# Patient Record
Sex: Female | Born: 1935 | Race: White | Hispanic: No | Marital: Married | Smoking: Former smoker
Health system: Southern US, Community
[De-identification: ages and names within clinical notes are randomized; demographics above are authoritative.]

## PROBLEM LIST (undated history)

## (undated) DIAGNOSIS — I4891 Unspecified atrial fibrillation: Secondary | ICD-10-CM

## (undated) DIAGNOSIS — M5090 Cervical disc disorder, unspecified, unspecified cervical region: Secondary | ICD-10-CM

## (undated) DIAGNOSIS — I6529 Occlusion and stenosis of unspecified carotid artery: Secondary | ICD-10-CM

## (undated) DIAGNOSIS — F419 Anxiety disorder, unspecified: Secondary | ICD-10-CM

## (undated) DIAGNOSIS — I4510 Unspecified right bundle-branch block: Secondary | ICD-10-CM

## (undated) DIAGNOSIS — I10 Essential (primary) hypertension: Secondary | ICD-10-CM

## (undated) DIAGNOSIS — M1990 Unspecified osteoarthritis, unspecified site: Secondary | ICD-10-CM

## (undated) DIAGNOSIS — R040 Epistaxis: Secondary | ICD-10-CM

## (undated) DIAGNOSIS — I701 Atherosclerosis of renal artery: Secondary | ICD-10-CM

## (undated) DIAGNOSIS — R0600 Dyspnea, unspecified: Secondary | ICD-10-CM

## (undated) DIAGNOSIS — Z87442 Personal history of urinary calculi: Secondary | ICD-10-CM

## (undated) DIAGNOSIS — E785 Hyperlipidemia, unspecified: Secondary | ICD-10-CM

## (undated) HISTORY — PX: CATARACT EXTRACTION: SUR2

## (undated) HISTORY — DX: Hyperlipidemia, unspecified: E78.5

## (undated) HISTORY — PX: OTHER SURGICAL HISTORY: SHX169

## (undated) HISTORY — PX: TONSILLECTOMY: SUR1361

## (undated) HISTORY — DX: Cervical disc disorder, unspecified, unspecified cervical region: M50.90

## (undated) HISTORY — PX: BACK SURGERY: SHX140

## (undated) HISTORY — DX: Atherosclerosis of renal artery: I70.1

## (undated) HISTORY — PX: DOPPLER ECHOCARDIOGRAPHY: SHX263

## (undated) HISTORY — DX: Unspecified atrial fibrillation: I48.91

## (undated) HISTORY — DX: Unspecified right bundle-branch block: I45.10

---
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
## 2000-10-29 ENCOUNTER — Other Ambulatory Visit: Admission: RE | Admit: 2000-10-29 | Discharge: 2000-10-29 | Payer: Self-pay | Admitting: Obstetrics and Gynecology

## 2002-08-16 ENCOUNTER — Emergency Department (HOSPITAL_COMMUNITY): Admission: EM | Admit: 2002-08-16 | Discharge: 2002-08-16 | Payer: Self-pay | Admitting: Emergency Medicine

## 2002-08-16 ENCOUNTER — Emergency Department (HOSPITAL_COMMUNITY): Admission: EM | Admit: 2002-08-16 | Discharge: 2002-08-16 | Payer: Self-pay | Admitting: *Deleted

## 2002-11-02 ENCOUNTER — Other Ambulatory Visit: Admission: RE | Admit: 2002-11-02 | Discharge: 2002-11-02 | Payer: Self-pay | Admitting: Obstetrics and Gynecology

## 2003-01-04 ENCOUNTER — Ambulatory Visit (HOSPITAL_COMMUNITY): Admission: RE | Admit: 2003-01-04 | Discharge: 2003-01-04 | Payer: Self-pay | Admitting: Obstetrics and Gynecology

## 2003-01-04 ENCOUNTER — Encounter: Payer: Self-pay | Admitting: Obstetrics and Gynecology

## 2004-01-18 ENCOUNTER — Ambulatory Visit (HOSPITAL_COMMUNITY): Admission: RE | Admit: 2004-01-18 | Discharge: 2004-01-18 | Payer: Self-pay | Admitting: Obstetrics and Gynecology

## 2004-01-31 ENCOUNTER — Ambulatory Visit (HOSPITAL_COMMUNITY): Admission: RE | Admit: 2004-01-31 | Discharge: 2004-01-31 | Payer: Self-pay | Admitting: Obstetrics and Gynecology

## 2004-02-20 ENCOUNTER — Ambulatory Visit (HOSPITAL_COMMUNITY): Admission: RE | Admit: 2004-02-20 | Discharge: 2004-02-20 | Payer: Self-pay | Admitting: Internal Medicine

## 2004-05-28 ENCOUNTER — Ambulatory Visit (HOSPITAL_COMMUNITY): Admission: RE | Admit: 2004-05-28 | Discharge: 2004-05-28 | Payer: Self-pay | Admitting: Internal Medicine

## 2004-05-29 ENCOUNTER — Ambulatory Visit (HOSPITAL_COMMUNITY): Admission: RE | Admit: 2004-05-29 | Discharge: 2004-05-29 | Payer: Self-pay | Admitting: Internal Medicine

## 2004-06-01 ENCOUNTER — Emergency Department (HOSPITAL_COMMUNITY): Admission: EM | Admit: 2004-06-01 | Discharge: 2004-06-01 | Payer: Self-pay | Admitting: Emergency Medicine

## 2004-07-03 ENCOUNTER — Ambulatory Visit (HOSPITAL_COMMUNITY): Admission: RE | Admit: 2004-07-03 | Discharge: 2004-07-04 | Payer: Self-pay | Admitting: Cardiovascular Disease

## 2004-07-06 HISTORY — PX: CAROTID STENT: SHX1301

## 2004-07-14 ENCOUNTER — Emergency Department (HOSPITAL_COMMUNITY): Admission: EM | Admit: 2004-07-14 | Discharge: 2004-07-14 | Payer: Self-pay | Admitting: Emergency Medicine

## 2004-08-02 ENCOUNTER — Emergency Department (HOSPITAL_COMMUNITY): Admission: EM | Admit: 2004-08-02 | Discharge: 2004-08-02 | Payer: Self-pay | Admitting: *Deleted

## 2005-01-09 ENCOUNTER — Ambulatory Visit: Payer: Self-pay | Admitting: Internal Medicine

## 2005-01-26 ENCOUNTER — Ambulatory Visit: Payer: Self-pay | Admitting: Internal Medicine

## 2005-01-26 ENCOUNTER — Encounter (INDEPENDENT_AMBULATORY_CARE_PROVIDER_SITE_OTHER): Payer: Self-pay | Admitting: Internal Medicine

## 2005-01-26 ENCOUNTER — Ambulatory Visit (HOSPITAL_COMMUNITY): Admission: RE | Admit: 2005-01-26 | Discharge: 2005-01-26 | Payer: Self-pay | Admitting: Internal Medicine

## 2006-08-04 HISTORY — PX: OTHER SURGICAL HISTORY: SHX169

## 2010-06-19 ENCOUNTER — Emergency Department (HOSPITAL_COMMUNITY): Admission: EM | Admit: 2010-06-19 | Discharge: 2009-07-15 | Payer: Self-pay | Admitting: Emergency Medicine

## 2010-08-14 ENCOUNTER — Other Ambulatory Visit (HOSPITAL_COMMUNITY): Payer: Self-pay | Admitting: Internal Medicine

## 2010-08-14 ENCOUNTER — Ambulatory Visit (HOSPITAL_COMMUNITY)
Admission: RE | Admit: 2010-08-14 | Discharge: 2010-08-14 | Disposition: A | Discharge status: Home / Self Care | Payer: MEDICARE | Source: Ambulatory Visit | Attending: Internal Medicine | Admitting: Internal Medicine

## 2010-08-14 ENCOUNTER — Encounter (HOSPITAL_COMMUNITY): Payer: Self-pay

## 2010-08-14 DIAGNOSIS — Z87891 Personal history of nicotine dependence: Secondary | ICD-10-CM | POA: Insufficient documentation

## 2010-08-14 DIAGNOSIS — R0789 Other chest pain: Secondary | ICD-10-CM | POA: Insufficient documentation

## 2010-08-14 HISTORY — DX: Essential (primary) hypertension: I10

## 2010-09-28 LAB — BASIC METABOLIC PANEL
BUN: 17 mg/dL (ref 6–23)
CO2: 27 mEq/L (ref 19–32)
Calcium: 9.4 mg/dL (ref 8.4–10.5)
Chloride: 100 mEq/L (ref 96–112)
Creatinine, Ser: 0.76 mg/dL (ref 0.4–1.2)
GFR calc Af Amer: >60 mL/min (ref >60)
GFR calc non Af Amer: >60 mL/min (ref >60)
Glucose, Bld: 108 mg/dL — ABNORMAL HIGH (ref 70–99)
Potassium: 3.4 mEq/L — ABNORMAL LOW (ref 3.5–5.1)
Sodium: 137 mEq/L (ref 135–145)

## 2010-09-28 LAB — CBC
HCT: 36.8 % (ref 36.0–46.0)
Hemoglobin: 12.4 g/dL (ref 12.0–15.0)
MCHC: 33.7 g/dL (ref 30.0–36.0)
MCV: 92.7 fL (ref 78.0–100.0)
Platelets: 211 10*3/uL (ref 150–400)
RBC: 3.97 MIL/uL (ref 3.87–5.11)
RDW: 13.3 % (ref 11.5–15.5)
WBC: 5.6 10*3/uL (ref 4.0–10.5)

## 2010-09-28 LAB — POCT CARDIAC MARKERS
CKMB, poc: <1 ng/mL — ABNORMAL LOW (ref 1.0–8.0)
Myoglobin, poc: 47.5 ng/mL (ref 12–200)
Troponin i, poc: <0.05 ng/mL (ref 0.00–0.09)

## 2010-09-28 LAB — DIFFERENTIAL
Basophils Absolute: 0.1 10*3/uL (ref 0.0–0.1)
Basophils Relative: 1 % (ref 0–1)
Eosinophils Absolute: 0.3 10*3/uL (ref 0.0–0.7)
Eosinophils Relative: 6 % — ABNORMAL HIGH (ref 0–5)
Lymphocytes Relative: 37 % (ref 12–46)
Lymphs Abs: 2 10*3/uL (ref 0.7–4.0)
Monocytes Absolute: 0.4 10*3/uL (ref 0.1–1.0)
Monocytes Relative: 6 % (ref 3–12)
Neutro Abs: 2.8 10*3/uL (ref 1.7–7.7)
Neutrophils Relative %: 50 % (ref 43–77)

## 2010-09-28 LAB — PROTIME-INR
INR: 1.05 (ref 0.00–1.49)
Prothrombin Time: 13.6 seconds (ref 11.6–15.2)

## 2010-11-28 NOTE — Discharge Summary (Signed)
NAMEMARIANY, MACKINTOSH NO.:  [id number]   MEDICAL RECORD NO.:  [id number]          PATIENT TYPE:  OIB   LOCATION:  3743                         FACILITY:  [facility]   PHYSICIAN:  Nanetta Batty, M.D.   DATE OF BIRTH:  07/21/1933   DATE OF ADMISSION:  07/03/2004  DATE OF DISCHARGE:  07/04/2004                                 DISCHARGE SUMMARY   DISCHARGE DIAGNOSES:  1.  Peripheral vascular disease with right renal artery stenosis, status      post stenting this admission.  2.  History of aspirin intolerance with a history of epistaxis.  3.  Right bundle branch block.  4.  Hypertension.   HOSPITAL COURSE:  The patient is a 77 year old patient followed by Dr.  Sherwood Gambler.  She had been seen by Dr. Allyson Sabal in November for renal artery  stenosis.  She has a history of hypertension.  CT angiogram of the abdomen  05/29/04 showed an 80 to 90% right renal artery stenosis.  She was seen by  Dr. Allyson Sabal in the office.  Renal artery Dopplers done at our office with  abnormal with equal to greater than 60% diameter reduction of the right  renal artery with normal kidney size.  Cardiolite study done 06/16/04 showed  no ischemia with an EF of 70%.  Echocardiogram done 06/17/04 showed LVH with  an EF of greater than 55%.  There is a mention of a PFO present without  evidence of shunting.   The patient was admitted 07/03/04 and set up for angiogram with elective  right renal artery intervention.  This was done by Dr. Allyson Sabal with good  result.  She did have a stent placed.  She has had problems with epistaxis,  even on low dose aspirin and she was not discharged on Plavix or aspirin.  She declined statin therapy and for now would like to try diet.   DISCHARGE MEDICATIONS:  Metoprolol 25 mg b.i.d.   LABORATORY DATA:  White count 6.3, hemoglobin 11.8, hematocrit 34.6,  platelet count 249.  EKG from the office shows sinus rhythm with right  bundle branch block.  Renal function shows sodium  141, k2.7, BUN 6,  creatinine 0.8, glucose 87, chest x-ray on the 22nd shows COPD and DJD in  the thoracic spine.   DISPOSITION:  The patient is discharged in stable condition.  She will  follow up with Dr. Allyson Sabal as an outpatient.      LKK/MEDQ  D:  07/04/2004  T:  07/05/2004  Job:  [id number]   cc:   Nanetta Batty, M.D.  Fax: [phone]   Madelin Rear. Sherwood Gambler, MD  [address]  [place]  [place] [address]  Fax: [phone]

## 2010-11-28 NOTE — Op Note (Signed)
NAMEMAYRA, Savannah Kirk                   ACCOUNT NO.:  [id number]   MEDICAL RECORD NO.:  [id number]          PATIENT TYPE:  AMB   LOCATION:  DAY                           FACILITY:  [facility]   PHYSICIAN:  Lionel December, M.D.    DATE OF BIRTH:  1933-10-23   DATE OF PROCEDURE:  01/26/2005  DATE OF DISCHARGE:                                 OPERATIVE REPORT   PROCEDURE:  Colonoscopy.   INDICATIONS:  Chalise is a 77 year old Caucasian female who is undergoing  screening colonoscopy. She has history of intermittent sporadic rectal  discomfort and spasm which generally occurs during middle of night. Family  history is negative for colorectal carcinoma. Procedures were reviewed with  the patient, informed consent was obtained.   PREMEDICATION:  Demerol 50 mg IV, Versed 7 mg IV in divided dose.   FINDINGS:  Procedure performed in endoscopy suite. The patient's vital signs  and O2 sat were monitored during procedure and remained stable. The patient  was placed left lateral position. Rectal examination performed. No  abnormality noted external or digital exam. Olympus video scope was placed  in the rectum and advanced under vision into sigmoid colon which was  somewhat noncompliant and tortuous. Slowly and carefully, scope was passed  into splenic flexure and beyond. Preparation was excellent. Scope was passed  to the cecum which was identified by appendiceal orifice and ileocecal  valve. Pictures taken for the record. As the scope was withdrawn, colonic  mucosa was carefully examined. This there was a single small polyp at distal  sigmoid colon which was ablated via cold biopsy. Rectal mucosa was normal.  Scope was retroflexed to examine anorectal junction and small hemorrhoids  were noted below the dentate line. Endoscope was straightened and withdrawn.  The patient tolerated the procedure well.   FINAL DIAGNOSES:  1.  Small polyp ablated via cold biopsy from the sigmoid colon.  2.  Small external  hemorrhoids.   RECOMMENDATIONS:  Standard instructions given. She will try NuLev SL p.r.n.  for nocturnal rectal pain.   I will be contacting the patient with biopsy results and further  recommendations.       NR/MEDQ  D:  01/26/2005  T:  01/26/2005  Job:  [id number]   cc:   Madelin Rear. Sherwood Gambler, MD  [address]  [place]  [place] [address]  Fax: [phone]   Dr. Tenny Craw

## 2010-11-28 NOTE — H&P (Signed)
NAME:  Savannah Kirk                   ACCOUNT NO.:  [id number]   MEDICAL RECORD NO.:  [id number]          PATIENT TYPE:  AMB   LOCATION:  DAY                           FACILITY:  [facility]   PHYSICIAN:  R. Roetta Sessions, M.D. DATE OF BIRTH:  1933-09-14   DATE OF ADMISSION:  DATE OF DISCHARGE:  LH                                HISTORY & PHYSICAL   CHIEF COMPLAINT:  Rectal spasms, possible colonoscopy.   PRIMARY CARE PHYSICIAN:  Dr. Sherwood Gambler.   GYNECOLOGIST:  Dr. Miguel Aschoff.   HISTORY OF PRESENT ILLNESS:  Savannah Kirk is a 77 year old Caucasian female, who  is a self-referral today for further evaluation of intermittent rectal pain  and possible colonoscopy.  About a year ago, she was recommend by her  gynecologist, Dr. Miguel Aschoff, to have a colonoscopy.  The patient states she  started having problems with her blood pressure, and she postponed her  procedure.  Overall, she has regular bowel movements.  Denies any  constipation.  When she eats vegetables, she gets diarrhea occasionally.  She has had some left lower quadrant abdominal pain in the past but none for  several months.  She had a physical examination by Dr. Miguel Aschoff as well as  a transabdominal and transvaginal pelvic ultrasound which did not explain  the symptoms.  Uterus appeared to be normal.  Neither ovary could reliably  be identified; there was a hypoechoic focus in the region of the left  uterine fundus which could be the left ovary, but this could not be  discretely discerned.  She also complains of intermittent rectal pain which  wakes her up at night, not associated with any bowel movements.  It occurs  very sporadically, maybe a couple of nights a week but then does not happen  for 6 months to a year later.  It usually lasts for 30 minutes at a time and  then resolves on its own.  Denies any melena or rectal bleeding.  No nausea,  vomiting, or heartburn.  Her weight has been stable.   CURRENT MEDICATIONS:  1.  Tylenol  p.r.n.  2.  Inderal 120 mg daily.   ALLERGIES:  1.  PENICILLIN.  2.  ASPIRIN caused epistaxis.   PAST MEDICAL HISTORY:  1.  Hypertension.  2.  Peripheral vascular disease with right renal artery stenosis, status      post stenting in December 2005.  She has a history of right bundle-      branch block as well.  3.  She has had a prior tonsillectomy.   FAMILY HISTORY:  Mother died of pneumonia at age 54.  Father died of  complications from appendectomy at age 56.  No family history of colorectal  cancer.   SOCIAL HISTORY:  She is single.  She has three daughters.  She is retired  from [employer].  She quit smoking several years ago.  She  never consumed alcohol.   REVIEW OF SYSTEMS:  See HPI for GI and constitutional.  CARDIOPULMONARY:  Denies chest pain or shortness of breath.  PHYSICAL EXAMINATION:  VITAL SIGNS:  Weight 151, height 5 feet 5 inches,  temp 97.9, blood pressure 164/88, pulse 60.  GENERAL:  Pleasant, well-nourished, well-developed Caucasian female in no  acute distress.  SKIN:  Warm and dry, no jaundice.  HEENT:  Conjunctivae are pink.  Sclerae nonicteric.  Oral and pharyngeal  mucosa moist and pink.  No lesions, erythema, or exudate.  NECK:  No lymphadenopathy, thyromegaly.  CHEST:  Lungs are clear to auscultation.  CARDIAC:  Regular rate and rhythm, normal S1 and S2.  No murmurs, rubs, or  gallops.  ABDOMEN:  Positive bowel sounds, soft, nontender, nondistended, no  organomegaly or masses.  No rebound tenderness or guarding.  No abdominal  bruits or hernias.  RECTAL EXAMINATION:  Deferred to time of colonoscopy.  EXTREMITIES:  No edema.   LABORATORY DATA:  From December 25, 2004, reveal normal CBC, MET-7.   IMPRESSION:  Savannah Kirk is a 77 year old lady with sporadic left lower  quadrant pain and rectal pain which did not seem to be related.  They occur  sporadically and are short-lived.  The left lower quadrant abdominal pain  has not  occurred in the last few months.  This may have been due to colonic  spasms, although it appears to be self-limited.  Gynecological work-up  revealed insignificant findings.  With regard to the rectal pain, this is  consistent with proctalgia fugax.  Given that symptoms are so sporadic,  would not recommend a prophylactic treatment.  She can try antispasmodic  therapy which may provide relief on a p.r.n. basis.  We discussed  extensively the need for colonoscopy now primarily for screening purposes.  I have discussed risks, alternatives, and benefits with regard to reactions  to medication, bleeding, infection, perforation, or incomplete exam, and she  is agreeable to proceed.   PLAN:  1.  Colonoscopy in the near future.  2.  Trial of NuLev 1-2 tabs sublingual p.r.n. rectal pain, not to use more      than 4 tabs per day, #16 samples given.      If rectal spasms become more frequent and are unresponsive to NuLev, we      can give her trial of nitroglycerin ointment.   Dr. Jena Gauss is co-signer in the absence of Dr. Karilyn Cota.       LL/MEDQ  D:  01/09/2005  T:  01/09/2005  Job:  [id number]   cc:   Madelin Rear. Sherwood Gambler, MD  [address]  [place]  [place] [address]  Fax: [phone]   Miguel Aschoff, M.D.  [address]  [place]  [place] [address]  Fax: [phone]

## 2010-11-28 NOTE — Cardiovascular Report (Signed)
NAMEJANAYLA, MARIK NO.:  [id number]   MEDICAL RECORD NO.:  [id number]          PATIENT TYPE:  OIB   LOCATION:  3743                         FACILITY:  [facility]   PHYSICIAN:  Nanetta Batty, M.D.   DATE OF BIRTH:  [birth year]   DATE OF PROCEDURE:  07/03/2004  DATE OF DISCHARGE:                              CARDIAC CATHETERIZATION   CLINICAL HISTORY:  Ms. Wogan is a 77 year old white female with accelerated  hypertension and 80% right renal artery stenosis by CT angio of her abdomen.  She had a negative Cardiolite and negative carotid Doppler study.  She  presents now for angiography and potential percutaneous intervention for  relief of symptoms of renovascular hypertension.   PROCEDURES:  1.  Abdominal aortogram.  2.  Selective left and right renal artery angiogram.  3.  PTA and stent of the right renal artery.   PROCEDURE DESCRIPTION:  Patient's right groin was prepped and shaved in the  usual sterile fashion.  1% Xylocaine was used for local anesthesia.  A 6-  French sheath was inserted into the right femoral artery using standard  Seldinger technique.  A 5-French pigtail catheter was used for midstream  abdominal aortography.  A short 5-French right Judkins catheter was used for  selective right and left renal artery angiography.  Visipaque dye was used  for the entirety of the case.  Retrograde aortic pressure was monitored  during the case.  A translesion gradient was performed across the right  renal artery stenosis which measured 25 mmHg.   ANGIOGRAPHIC RESULTS:  60-70% proximal right renal artery stenosis.   PROCEDURE DESCRIPTION:  The patient received 2500 units of heparin  intravenously.  Using a 6-French short right Judkins sheath and an 0.014  stabilizer wire direct stenting was performed using a 6 x 12 Genesis on  Aviator balloon stent premount.  The stent was angiographically placed  precisely across the lesion and deployed at 10-12 atmospheres  resulting in  reduction of a 60-70% proximal right renal artery stenosis to 0% residual.  Patient tolerated the procedure well.  ACT was measured and the sheath was  removed.  Pressure was held on the groin to achieve  hemostasis.  Patient left the laboratory in stable condition.  Plans will be  to hydrate her overnight and discharge home in the morning.  She is very  sensitive to antiplatelet agents causing severe nose bleeds and therefore  aspirin and Plavix will not be used in this particular case.  She left the  laboratory in stable condition.      JB/MEDQ  D:  07/03/2004  T:  07/03/2004  Job:  [id number]   cc:   [facility]   Madelin Rear. Sherwood Gambler, MD  [address]  [place]  [place] [address]  Fax: [phone]

## 2011-04-29 ENCOUNTER — Other Ambulatory Visit: Payer: Self-pay | Admitting: Neurosurgery

## 2011-04-29 DIAGNOSIS — M5410 Radiculopathy, site unspecified: Secondary | ICD-10-CM

## 2011-04-29 DIAGNOSIS — M549 Dorsalgia, unspecified: Secondary | ICD-10-CM

## 2011-05-05 ENCOUNTER — Ambulatory Visit
Admission: RE | Admit: 2011-05-05 | Discharge: 2011-05-05 | Disposition: A | Discharge status: Home / Self Care | Payer: Medicare Other | Source: Ambulatory Visit | Attending: Neurosurgery | Admitting: Neurosurgery

## 2011-05-05 DIAGNOSIS — M549 Dorsalgia, unspecified: Secondary | ICD-10-CM

## 2011-05-05 DIAGNOSIS — M5410 Radiculopathy, site unspecified: Secondary | ICD-10-CM

## 2012-06-01 ENCOUNTER — Other Ambulatory Visit (HOSPITAL_COMMUNITY): Payer: Self-pay | Admitting: Cardiovascular Disease

## 2012-06-01 DIAGNOSIS — R0989 Other specified symptoms and signs involving the circulatory and respiratory systems: Secondary | ICD-10-CM

## 2012-07-28 ENCOUNTER — Emergency Department (HOSPITAL_COMMUNITY)
Admission: EM | Admit: 2012-07-28 | Discharge: 2012-07-28 | Disposition: A | Discharge status: Home / Self Care | Payer: Medicare Other | Attending: Emergency Medicine | Admitting: Emergency Medicine

## 2012-07-28 ENCOUNTER — Encounter (HOSPITAL_COMMUNITY): Payer: Self-pay | Admitting: Emergency Medicine

## 2012-07-28 DIAGNOSIS — R42 Dizziness and giddiness: Secondary | ICD-10-CM | POA: Insufficient documentation

## 2012-07-28 DIAGNOSIS — Z79899 Other long term (current) drug therapy: Secondary | ICD-10-CM | POA: Insufficient documentation

## 2012-07-28 DIAGNOSIS — I10 Essential (primary) hypertension: Secondary | ICD-10-CM | POA: Insufficient documentation

## 2012-07-28 DIAGNOSIS — M79605 Pain in left leg: Secondary | ICD-10-CM

## 2012-07-28 DIAGNOSIS — R209 Unspecified disturbances of skin sensation: Secondary | ICD-10-CM | POA: Insufficient documentation

## 2012-07-28 DIAGNOSIS — M79609 Pain in unspecified limb: Secondary | ICD-10-CM | POA: Insufficient documentation

## 2012-07-28 DIAGNOSIS — Z87891 Personal history of nicotine dependence: Secondary | ICD-10-CM | POA: Insufficient documentation

## 2012-07-28 MED ORDER — HYDROCODONE-ACETAMINOPHEN 5-325 MG PO TABS: 1.0000 | ORAL_TABLET | Freq: Four times a day (QID) | ORAL | Status: DC | PRN

## 2012-07-28 NOTE — ED Notes (Signed)
Patient says she is not in pain now, however when she fist was experiencing pain it was a 10/10.  Patient says she was just sitting in her chair and her leg began to hurt.  She tried to stretch it out and it didn't work.  Patient said sit stopped hurting, but then it started getting numb and tingling. She called Dr. Ollen Bowl' office to let them know and they advised her to come to the ED.  Patient says she sees the [facility] for sciatica and Dr. Ollen Bowl' administers spinal injections for the right leg, not the left.  So she is not sure why her left leg is hurting her.

## 2012-07-28 NOTE — ED Notes (Signed)
Pt st's she is currently getting spinal injections in back for right leg pain.  St's earlier today she started to have pain in left leg but now pain has subsided.  Pt st's she called her MD  But he will not be back in the office till Mon.

## 2012-07-28 NOTE — ED Notes (Signed)
Family at bedside. 

## 2012-07-28 NOTE — ED Provider Notes (Signed)
History  This chart was scribed for non-physician practitioner working with Gwyneth Sprout, MD by Ardeen Jourdain, ED Scribe. This patient was seen in room TR07C/TR07C and the patient's care was started at 1712.  CSN: [id number]  Arrival date & time 07/28/12  1514   First MD Initiated Contact with Patient 07/28/12 1712      Chief Complaint  Patient presents with  . Leg Pain     Patient is a 79 y.o. female presenting with leg pain. The history is provided by the patient. No language interpreter was used.  Leg Pain  The incident occurred 6 to 12 hours ago. The incident occurred at home. There was no injury mechanism. The pain is present in the left leg. The quality of the pain is described as sharp and tingling. The pain is moderate. The pain has been intermittent since onset. Associated symptoms include tingling. Pertinent negatives include no numbness, no inability to bear weight, no loss of motion, no muscle weakness and no loss of sensation. She reports no foreign bodies present. Nothing aggravates the symptoms. She has tried nothing for the symptoms.    JAIYANA CANALE is a 79 y.o. female who presents to the Emergency Department complaining of intermittent left leg pain. She describes the pain as sharp, stabbing and shocking feeling. She states she has had similar pain in the past when her PCP injected her and accidentally hit her sciatic nerve. She reports taking 400 mg ibuprofen this morning which relieved the pain. She is not c/o any pain currently. She states she is currently receiving spinal injections in her back for her right leg pain.   Past Medical History  Diagnosis Date  . Hypertension     Past Surgical History  Procedure Date  . Kidney stent     No family history on file.  History  Substance Use Topics  . Smoking status: Former Games developer  . Smokeless tobacco: Not on file  . Alcohol Use: No   No OB history available.   Review of Systems  Musculoskeletal: Negative  for back pain.       Left leg pain  Neurological: Positive for tingling and light-headedness. Negative for dizziness, weakness and numbness.  All other systems reviewed and are negative.    Allergies  Aspirin and Penicillins  Home Medications   Current Outpatient Rx  Name  Route  Sig  Dispense  Refill  . DOXAZOSIN MESYLATE 1 MG PO TABS   Oral   Take 1 mg by mouth daily after lunch.         Marland Kitchen GABAPENTIN 100 MG PO CAPS   Oral   Take 100 mg by mouth at bedtime.         Marland Kitchen HYDROCHLOROTHIAZIDE 25 MG PO TABS   Oral   Take 12.5 mg by mouth daily.         . IBUPROFEN 800 MG PO TABS   Oral   Take 400 mg by mouth daily as needed. For pain         . MAGNESIUM PO   Oral   Take 1 tablet by mouth daily.         Marland Kitchen NIFEDIPINE ER OSMOTIC 30 MG PO TB24   Oral   Take 30 mg by mouth at bedtime.         Marland Kitchen FISH OIL 300 MG PO CAPS   Oral   Take 300 mg by mouth daily.         Marland Kitchen PROPRANOLOL HCL  80 MG PO TABS   Oral   Take 160 mg by mouth daily.           Triage Vitals: BP 175/67  Pulse 55  Temp 98.5 F (36.9 C) (Oral)  Resp 16  SpO2 97%  Physical Exam  Nursing note and vitals reviewed. Constitutional: She is oriented to person, place, and time. She appears well-developed and well-nourished. No distress.  HENT:  Head: Normocephalic and atraumatic.  Right Ear: External ear normal.  Left Ear: External ear normal.  Eyes: EOM are normal. Pupils are equal, round, and reactive to light.  Neck: Normal range of motion. Neck supple. No tracheal deviation present.  Cardiovascular: Normal rate, regular rhythm and normal heart sounds.  Exam reveals no gallop and no friction rub.   No murmur heard. Pulmonary/Chest: Effort normal and breath sounds normal. No respiratory distress. She has no wheezes. She has no rales. She exhibits no tenderness.  Abdominal: Soft. Bowel sounds are normal. She exhibits no distension and no mass. There is no tenderness. There is no rebound and  no guarding.  Musculoskeletal: Normal range of motion. She exhibits no edema.  Neurological: She is alert and oriented to person, place, and time.  Skin: Skin is warm and dry. No rash noted. No erythema. No pallor.  Psychiatric: She has a normal mood and affect. Her behavior is normal.    ED Course  Procedures (including critical care time)  DIAGNOSTIC STUDIES: Oxygen Saturation is 97% on room air, adequate by my interpretation.    COORDINATION OF CARE:  5:20 PM: Discussed treatment plan which includes ibuprofen with pt at bedside and pt agreed to plan.     Labs Reviewed - No data to display No results found.   No diagnosis found.  Patient had taken 400 mg ibuprofen prior to coming to hospital. At time of exam, patient's symptoms have totally resolved.   Discussed with, and patient seen by Dr. Anitra Lauth.  Will add norco to treatment plan.  Patient to follow-up with Dr. Norville Haggard.  Left leg pain, resolved.  Suspect sciatica.  MDM   I personally performed the services described in this documentation, which was scribed in my presence. The recorded information has been reviewed and is accurate.        Jimmye Norman, NP 07/28/12 [phone]

## 2012-07-29 NOTE — ED Provider Notes (Signed)
Medical screening examination/treatment/procedure(s) were conducted as a shared visit with non-physician practitioner(s) and myself.  I personally evaluated the patient during the encounter Patient with history of sciatica who had worsening pain today. Took ibuprofen prior to arrival and now is pain-free. Otherwise normal exam  Gwyneth Sprout, MD 07/29/12 [phone]

## 2012-08-02 ENCOUNTER — Encounter (HOSPITAL_COMMUNITY): Payer: Medicare Other

## 2012-08-05 ENCOUNTER — Encounter (HOSPITAL_COMMUNITY): Payer: Medicare Other

## 2012-08-15 ENCOUNTER — Other Ambulatory Visit: Payer: Self-pay | Admitting: Anesthesiology

## 2012-08-15 DIAGNOSIS — M545 Low back pain, unspecified: Secondary | ICD-10-CM

## 2012-08-15 DIAGNOSIS — M48061 Spinal stenosis, lumbar region without neurogenic claudication: Secondary | ICD-10-CM

## 2012-08-20 ENCOUNTER — Ambulatory Visit
Admission: RE | Admit: 2012-08-20 | Discharge: 2012-08-20 | Disposition: A | Discharge status: Home / Self Care | Payer: Medicare Other | Source: Ambulatory Visit | Attending: Anesthesiology | Admitting: Anesthesiology

## 2012-08-20 DIAGNOSIS — M48061 Spinal stenosis, lumbar region without neurogenic claudication: Secondary | ICD-10-CM

## 2012-08-20 DIAGNOSIS — M545 Low back pain, unspecified: Secondary | ICD-10-CM

## 2012-12-02 ENCOUNTER — Ambulatory Visit (HOSPITAL_COMMUNITY)
Admission: RE | Admit: 2012-12-02 | Discharge: 2012-12-02 | Disposition: A | Discharge status: Home / Self Care | Payer: Medicare Other | Source: Ambulatory Visit | Attending: Cardiovascular Disease | Admitting: Cardiovascular Disease

## 2012-12-02 DIAGNOSIS — R0989 Other specified symptoms and signs involving the circulatory and respiratory systems: Secondary | ICD-10-CM | POA: Insufficient documentation

## 2012-12-02 NOTE — Progress Notes (Signed)
Carotid Duplex Completed. °Savannah Kirk ° °

## 2013-06-14 ENCOUNTER — Encounter: Payer: Self-pay | Admitting: Cardiovascular Disease

## 2013-06-14 ENCOUNTER — Ambulatory Visit (INDEPENDENT_AMBULATORY_CARE_PROVIDER_SITE_OTHER): Payer: Medicare Other | Admitting: Cardiovascular Disease

## 2013-06-14 VITALS — BP 130/72 | HR 64 | Ht 61.0 in | Wt 164.3 lb

## 2013-06-14 DIAGNOSIS — I701 Atherosclerosis of renal artery: Secondary | ICD-10-CM

## 2013-06-14 DIAGNOSIS — I10 Essential (primary) hypertension: Secondary | ICD-10-CM

## 2013-06-14 NOTE — Assessment & Plan Note (Signed)
Savannah Kirk had right renal artery stenting by myself 07/03/04. Followup renal Doppler studies have shown this to be widely patent as recently as 08/03/11. Recheck a renal Doppler study.

## 2013-06-14 NOTE — Patient Instructions (Signed)
Your physician has requested that you have a renal artery duplex. During this test, an ultrasound is used to evaluate blood flow to the kidneys. Allow one hour for this exam. Do not eat after midnight the day before and avoid carbonated beverages. Take your medications as you usually do. In April   Your physician recommends that you schedule a follow-up appointment in: 1 year

## 2013-06-14 NOTE — Progress Notes (Signed)
06/14/2013 Savannah Kirk   02-Jun-1934  [id number]  Primary Physician Carylon Perches, MD Primary Cardiologist: Runell Gess MD [name]   HPI:  The patient is a 79 year old, mildly overweight, divorced Caucasian female, mother of 3 who I last saw in the office 18 months ago. She has a history of PAD status post right renal artery PTA and stenting by myself July 06, 2004. We have been following this by duplex ultrasound which was last done August 03, 2011, that was stable. Her other problems include hypertension and hyperlipidemia. Not on a statin drug because of statin intolerance. She also has right lower extremity discomfort diagnosed by Dr. Franky Macho and spinal stenosis with radicular pain improved with steroid injection. Since I saw her a year ago she's remained otherwise asymptomatic. We'll recheck a renal Doppler study. Dr. Ouida Sills follows her lipid profile.    Current Outpatient Prescriptions  Medication Sig Dispense Refill  . diazepam (VALIUM) 10 MG tablet Take 10 mg by mouth at bedtime as needed.      . doxazosin (CARDURA) 1 MG tablet Take 1 mg by mouth daily after lunch.      . hydrochlorothiazide (HYDRODIURIL) 25 MG tablet Take 12.5 mg by mouth daily.      Marland Kitchen ibuprofen (ADVIL,MOTRIN) 800 MG tablet Take 400 mg by mouth daily as needed. For pain      . MAGNESIUM PO Take 1 tablet by mouth daily.      Marland Kitchen NIFEdipine (NIFEDICAL XL) 30 MG 24 hr tablet Take 30 mg by mouth at bedtime.      . Omega-3 Fatty Acids (FISH OIL) 1000 MG CAPS Take 1 capsule by mouth daily.      . propranolol (INDERAL) 80 MG tablet Take 160 mg by mouth daily.       No current facility-administered medications for this visit.    Allergies  Allergen Reactions  . Aspirin Other (See Comments)    Nose bleeds  . Penicillins Other (See Comments)    Skins comes off tongue    History   Social History  . Marital Status: Married    Spouse Name: N/A    Number of Children: N/A  . Years of  Education: N/A   Occupational History  . Not on file.   Social History Main Topics  . Smoking status: Former Smoker    Types: Cigarettes    Quit date: 06/14/1985  . Smokeless tobacco: Never Used  . Alcohol Use: No  . Drug Use: No  . Sexual Activity: Not on file   Other Topics Concern  . Not on file   Social History Narrative  . No narrative on file     Review of Systems: General: negative for chills, fever, night sweats or weight changes.  Cardiovascular: negative for chest pain, dyspnea on exertion, edema, orthopnea, palpitations, paroxysmal nocturnal dyspnea or shortness of breath Dermatological: negative for rash Respiratory: negative for cough or wheezing Urologic: negative for hematuria Abdominal: negative for nausea, vomiting, diarrhea, bright red blood per rectum, melena, or hematemesis Neurologic: negative for visual changes, syncope, or dizziness All other systems reviewed and are otherwise negative except as noted above.    Blood pressure 130/72, pulse 64, height 5\' 1"  (1.549 m), weight 164 lb 4.8 oz (74.526 kg).  General appearance: alert and no distress Neck: no adenopathy, no carotid bruit, no JVD, supple, symmetrical, trachea midline and thyroid not enlarged, symmetric, no tenderness/mass/nodules Lungs: clear to auscultation bilaterally Heart: regular rate and rhythm, S1, S2  normal, no murmur, click, rub or gallop Extremities: extremities normal, atraumatic, no cyanosis or edema  EKG not performed today  ASSESSMENT AND PLAN:   Renal artery stenosis Ms. Savannah Kirk had right renal artery stenting by myself 07/03/04. Followup renal Doppler studies have shown this to be widely patent as recently as 08/03/11. Recheck a renal Doppler study.      Runell Gess MD FACP,FACC,FAHA, [facility] 06/14/2013 12:29 PM

## 2014-01-03 ENCOUNTER — Ambulatory Visit (HOSPITAL_COMMUNITY)
Admission: RE | Admit: 2014-01-03 | Discharge: 2014-01-03 | Disposition: A | Discharge status: Home / Self Care | Payer: Medicare Other | Source: Ambulatory Visit | Attending: Cardiology | Admitting: Cardiology

## 2014-01-03 DIAGNOSIS — I701 Atherosclerosis of renal artery: Secondary | ICD-10-CM | POA: Insufficient documentation

## 2014-01-08 ENCOUNTER — Encounter: Payer: Self-pay | Admitting: *Deleted

## 2014-07-11 ENCOUNTER — Ambulatory Visit: Payer: Medicare Other | Admitting: Cardiovascular Disease

## 2014-09-18 DIAGNOSIS — H52203 Unspecified astigmatism, bilateral: Secondary | ICD-10-CM | POA: Diagnosis not present

## 2014-09-18 DIAGNOSIS — H524 Presbyopia: Secondary | ICD-10-CM | POA: Diagnosis not present

## 2014-09-18 DIAGNOSIS — Z961 Presence of intraocular lens: Secondary | ICD-10-CM | POA: Diagnosis not present

## 2014-09-18 DIAGNOSIS — H5213 Myopia, bilateral: Secondary | ICD-10-CM | POA: Diagnosis not present

## 2014-09-21 DIAGNOSIS — I10 Essential (primary) hypertension: Secondary | ICD-10-CM | POA: Diagnosis not present

## 2014-09-21 DIAGNOSIS — M4800 Spinal stenosis, site unspecified: Secondary | ICD-10-CM | POA: Diagnosis not present

## 2014-09-25 ENCOUNTER — Ambulatory Visit: Payer: Medicare Other | Admitting: Cardiovascular Disease

## 2014-11-01 DIAGNOSIS — X32XXXD Exposure to sunlight, subsequent encounter: Secondary | ICD-10-CM | POA: Diagnosis not present

## 2014-11-01 DIAGNOSIS — L570 Actinic keratosis: Secondary | ICD-10-CM | POA: Diagnosis not present

## 2014-11-01 DIAGNOSIS — D225 Melanocytic nevi of trunk: Secondary | ICD-10-CM | POA: Diagnosis not present

## 2014-11-02 ENCOUNTER — Encounter: Payer: Self-pay | Admitting: Cardiovascular Disease

## 2014-11-02 ENCOUNTER — Ambulatory Visit (INDEPENDENT_AMBULATORY_CARE_PROVIDER_SITE_OTHER): Payer: Medicare Other | Admitting: Cardiovascular Disease

## 2014-11-02 VITALS — BP 146/74 | HR 57 | Ht 61.0 in | Wt 164.0 lb

## 2014-11-02 DIAGNOSIS — I10 Essential (primary) hypertension: Secondary | ICD-10-CM | POA: Diagnosis not present

## 2014-11-02 DIAGNOSIS — E785 Hyperlipidemia, unspecified: Secondary | ICD-10-CM | POA: Diagnosis not present

## 2014-11-02 DIAGNOSIS — I701 Atherosclerosis of renal artery: Secondary | ICD-10-CM | POA: Diagnosis not present

## 2014-11-02 NOTE — Assessment & Plan Note (Signed)
History of renal artery stenosis status post right renal artery PTA and stenting by myself 07/06/04. Her last renal Doppler study performed 01/03/14 revealed this to be widely patent.

## 2014-11-02 NOTE — Assessment & Plan Note (Signed)
History of hyperlipidemia with statin intolerance 

## 2014-11-02 NOTE — Assessment & Plan Note (Signed)
History of hypertension blood pressure measured at 146/74. She is on propranolol, hydrochlorothiazide and nifedipine. Continued current meds at current dosing

## 2014-11-02 NOTE — Patient Instructions (Addendum)
Dr Gwenlyn Found has recommended that you follow-up with him as needed.

## 2014-11-02 NOTE — Progress Notes (Signed)
11/02/2014 Savannah Kirk [name]   10-19-1933  [id number]  Primary Physician Asencion Noble, MD Primary Cardiologist: Lorretta Harp MD [name]   HPI:   The patient is a 81 year old, mildly overweight, divorced Caucasian female, mother of 3 who I last saw in the office 16 months ago. She has a history of PAD status post right renal artery PTA and stenting by myself July 06, 2004. We have been following this by duplex ultrasound which was last done August 03, 2011, that was stable. Her other problems include hypertension and hyperlipidemia. Not on a statin drug because of statin intolerance. She also has right lower extremity discomfort diagnosed by Dr. Christella Noa and spinal stenosis with radicular pain improved with steroid injection. Since I saw her a year ago she's remained otherwise asymptomatic. Her most recent l renal Doppler study performed 01/03/14 revealed a widely patent right renal artery stent. Dr. Willey Blade follows her lipid profile.   Current Outpatient Prescriptions  Medication Sig Dispense Refill  . diazepam (VALIUM) 10 MG tablet Take 10 mg by mouth at bedtime as needed.    . doxazosin (CARDURA) 2 MG tablet Take 1 mg by mouth daily.     . hydrochlorothiazide (HYDRODIURIL) 25 MG tablet Take 12.5 mg by mouth daily.    Marland Kitchen ibuprofen (ADVIL,MOTRIN) 800 MG tablet Take 400 mg by mouth daily as needed. For pain    . MAGNESIUM PO Take 1 tablet by mouth daily.    Marland Kitchen NIFEdipine (NIFEDICAL XL) 30 MG 24 hr tablet Take 30 mg by mouth at bedtime.    . Omega-3 Fatty Acids (FISH OIL) 1000 MG CAPS Take 1 capsule by mouth daily.    . propranolol (INDERAL) 80 MG tablet Take 160 mg by mouth daily.     No current facility-administered medications for this visit.    Allergies  Allergen Reactions  . Aspirin Other (See Comments)    Nose bleeds  . Penicillins Other (See Comments)    Skins comes off tongue    History   Social History  . Marital Status: Married    Spouse Name: N/A  .  Number of Children: N/A  . Years of Education: N/A   Occupational History  . Not on file.   Social History Main Topics  . Smoking status: Former Smoker    Types: Cigarettes    Quit date: 06/14/1985  . Smokeless tobacco: Never Used  . Alcohol Use: No  . Drug Use: No  . Sexual Activity: Not on file   Other Topics Concern  . Not on file   Social History Narrative     Review of Systems: General: negative for chills, fever, night sweats or weight changes.  Cardiovascular: negative for chest pain, dyspnea on exertion, edema, orthopnea, palpitations, paroxysmal nocturnal dyspnea or shortness of breath Dermatological: negative for rash Respiratory: negative for cough or wheezing Urologic: negative for hematuria Abdominal: negative for nausea, vomiting, diarrhea, bright red blood per rectum, melena, or hematemesis Neurologic: negative for visual changes, syncope, or dizziness All other systems reviewed and are otherwise negative except as noted above.    Blood pressure 146/74, pulse 57, height 5\' 1"  (1.549 m), weight 164 lb (74.39 kg).  General appearance: alert and no distress Neck: no adenopathy, no carotid bruit, no JVD, supple, symmetrical, trachea midline and thyroid not enlarged, symmetric, no tenderness/mass/nodules Lungs: clear to auscultation bilaterally Heart: regular rate and rhythm, S1, S2 normal, no murmur, click, rub or gallop Extremities: extremities normal, atraumatic, no cyanosis or edema  EKG sinus bradycardia 56 with right bundle branch block new since the prior EKG in the system done in 2010. Personal review this EKG  ASSESSMENT AND PLAN:   Renal artery stenosis History of renal artery stenosis status post right renal artery PTA and stenting by myself 07/06/04. Her last renal Doppler study performed 01/03/14 revealed this to be widely patent.   Hyperlipidemia History of hyperlipidemia with statin intolerance.   Essential hypertension History of  hypertension blood pressure measured at 146/74. She is on propranolol, hydrochlorothiazide and nifedipine. Continued current meds at current dosing       Lorretta Harp MD [facility], [facility] 11/02/2014 12:01 PM

## 2014-11-21 ENCOUNTER — Encounter: Payer: Self-pay | Admitting: *Deleted

## 2014-11-29 ENCOUNTER — Encounter: Payer: Self-pay | Admitting: Cardiovascular Disease

## 2015-01-17 ENCOUNTER — Encounter (INDEPENDENT_AMBULATORY_CARE_PROVIDER_SITE_OTHER): Payer: Self-pay | Admitting: *Deleted

## 2015-01-17 ENCOUNTER — Other Ambulatory Visit: Payer: Self-pay | Admitting: Cardiovascular Disease

## 2015-01-17 DIAGNOSIS — I701 Atherosclerosis of renal artery: Secondary | ICD-10-CM

## 2015-02-04 ENCOUNTER — Ambulatory Visit (HOSPITAL_COMMUNITY)
Admission: RE | Admit: 2015-02-04 | Discharge: 2015-02-04 | Disposition: A | Discharge status: Home / Self Care | Payer: Medicare Other | Source: Ambulatory Visit | Attending: Internal Medicine | Admitting: Internal Medicine

## 2015-02-04 DIAGNOSIS — I701 Atherosclerosis of renal artery: Secondary | ICD-10-CM | POA: Insufficient documentation

## 2015-02-11 ENCOUNTER — Other Ambulatory Visit (HOSPITAL_COMMUNITY): Payer: Self-pay | Admitting: Internal Medicine

## 2015-02-11 DIAGNOSIS — Z6830 Body mass index (BMI) 30.0-30.9, adult: Secondary | ICD-10-CM | POA: Diagnosis not present

## 2015-02-11 DIAGNOSIS — I10 Essential (primary) hypertension: Secondary | ICD-10-CM | POA: Diagnosis not present

## 2015-02-11 DIAGNOSIS — M48061 Spinal stenosis, lumbar region without neurogenic claudication: Secondary | ICD-10-CM

## 2015-02-11 DIAGNOSIS — M4800 Spinal stenosis, site unspecified: Secondary | ICD-10-CM | POA: Diagnosis not present

## 2015-02-11 DIAGNOSIS — Z23 Encounter for immunization: Secondary | ICD-10-CM | POA: Diagnosis not present

## 2015-02-13 ENCOUNTER — Ambulatory Visit (HOSPITAL_COMMUNITY)
Admission: RE | Admit: 2015-02-13 | Discharge: 2015-02-13 | Disposition: A | Discharge status: Home / Self Care | Payer: Medicare Other | Source: Ambulatory Visit | Attending: Internal Medicine | Admitting: Internal Medicine

## 2015-02-13 DIAGNOSIS — M4806 Spinal stenosis, lumbar region: Secondary | ICD-10-CM | POA: Diagnosis not present

## 2015-02-13 DIAGNOSIS — M5126 Other intervertebral disc displacement, lumbar region: Secondary | ICD-10-CM | POA: Diagnosis not present

## 2015-02-13 DIAGNOSIS — M545 Low back pain: Secondary | ICD-10-CM | POA: Diagnosis present

## 2015-02-13 DIAGNOSIS — M48061 Spinal stenosis, lumbar region without neurogenic claudication: Secondary | ICD-10-CM

## 2015-03-26 DIAGNOSIS — Z23 Encounter for immunization: Secondary | ICD-10-CM | POA: Diagnosis not present

## 2015-03-29 DIAGNOSIS — M4806 Spinal stenosis, lumbar region: Secondary | ICD-10-CM | POA: Diagnosis not present

## 2015-03-29 DIAGNOSIS — M4316 Spondylolisthesis, lumbar region: Secondary | ICD-10-CM | POA: Diagnosis not present

## 2015-03-29 DIAGNOSIS — M47896 Other spondylosis, lumbar region: Secondary | ICD-10-CM | POA: Diagnosis not present

## 2015-04-30 DIAGNOSIS — Z01818 Encounter for other preprocedural examination: Secondary | ICD-10-CM | POA: Diagnosis not present

## 2015-05-02 DIAGNOSIS — M4806 Spinal stenosis, lumbar region: Secondary | ICD-10-CM | POA: Diagnosis not present

## 2015-05-02 DIAGNOSIS — M4326 Fusion of spine, lumbar region: Secondary | ICD-10-CM | POA: Diagnosis not present

## 2015-05-02 DIAGNOSIS — M549 Dorsalgia, unspecified: Secondary | ICD-10-CM | POA: Diagnosis not present

## 2015-05-02 DIAGNOSIS — Z880 Allergy status to penicillin: Secondary | ICD-10-CM | POA: Diagnosis not present

## 2015-05-02 DIAGNOSIS — M4316 Spondylolisthesis, lumbar region: Secondary | ICD-10-CM | POA: Diagnosis not present

## 2015-05-02 DIAGNOSIS — Z886 Allergy status to analgesic agent status: Secondary | ICD-10-CM | POA: Diagnosis not present

## 2015-05-02 DIAGNOSIS — M4726 Other spondylosis with radiculopathy, lumbar region: Secondary | ICD-10-CM | POA: Diagnosis not present

## 2015-05-02 DIAGNOSIS — I10 Essential (primary) hypertension: Secondary | ICD-10-CM | POA: Diagnosis not present

## 2015-05-02 DIAGNOSIS — M4800 Spinal stenosis, site unspecified: Secondary | ICD-10-CM | POA: Diagnosis not present

## 2015-05-02 DIAGNOSIS — Z79899 Other long term (current) drug therapy: Secondary | ICD-10-CM | POA: Diagnosis not present

## 2015-05-02 DIAGNOSIS — M47816 Spondylosis without myelopathy or radiculopathy, lumbar region: Secondary | ICD-10-CM | POA: Diagnosis not present

## 2015-05-10 DIAGNOSIS — M47816 Spondylosis without myelopathy or radiculopathy, lumbar region: Secondary | ICD-10-CM | POA: Diagnosis not present

## 2015-05-10 DIAGNOSIS — Z981 Arthrodesis status: Secondary | ICD-10-CM | POA: Diagnosis not present

## 2015-05-10 DIAGNOSIS — M47896 Other spondylosis, lumbar region: Secondary | ICD-10-CM | POA: Diagnosis not present

## 2015-06-10 DIAGNOSIS — I10 Essential (primary) hypertension: Secondary | ICD-10-CM | POA: Diagnosis not present

## 2015-06-10 DIAGNOSIS — I701 Atherosclerosis of renal artery: Secondary | ICD-10-CM | POA: Diagnosis not present

## 2015-06-10 DIAGNOSIS — Z79899 Other long term (current) drug therapy: Secondary | ICD-10-CM | POA: Diagnosis not present

## 2015-06-10 DIAGNOSIS — E785 Hyperlipidemia, unspecified: Secondary | ICD-10-CM | POA: Diagnosis not present

## 2015-06-17 DIAGNOSIS — M4800 Spinal stenosis, site unspecified: Secondary | ICD-10-CM | POA: Diagnosis not present

## 2015-06-17 DIAGNOSIS — Z6829 Body mass index (BMI) 29.0-29.9, adult: Secondary | ICD-10-CM | POA: Diagnosis not present

## 2015-06-17 DIAGNOSIS — I10 Essential (primary) hypertension: Secondary | ICD-10-CM | POA: Diagnosis not present

## 2015-06-17 DIAGNOSIS — E785 Hyperlipidemia, unspecified: Secondary | ICD-10-CM | POA: Diagnosis not present

## 2015-08-22 DIAGNOSIS — M79671 Pain in right foot: Secondary | ICD-10-CM | POA: Diagnosis not present

## 2015-08-22 DIAGNOSIS — M722 Plantar fascial fibromatosis: Secondary | ICD-10-CM | POA: Diagnosis not present

## 2015-09-12 DIAGNOSIS — M722 Plantar fascial fibromatosis: Secondary | ICD-10-CM | POA: Diagnosis not present

## 2015-09-12 DIAGNOSIS — M79672 Pain in left foot: Secondary | ICD-10-CM | POA: Diagnosis not present

## 2015-10-03 DIAGNOSIS — M79671 Pain in right foot: Secondary | ICD-10-CM | POA: Diagnosis not present

## 2015-10-03 DIAGNOSIS — M722 Plantar fascial fibromatosis: Secondary | ICD-10-CM | POA: Diagnosis not present

## 2015-10-21 DIAGNOSIS — G4709 Other insomnia: Secondary | ICD-10-CM | POA: Diagnosis not present

## 2015-10-21 DIAGNOSIS — I10 Essential (primary) hypertension: Secondary | ICD-10-CM | POA: Diagnosis not present

## 2015-10-24 DIAGNOSIS — M4806 Spinal stenosis, lumbar region: Secondary | ICD-10-CM | POA: Diagnosis not present

## 2015-10-24 DIAGNOSIS — M4316 Spondylolisthesis, lumbar region: Secondary | ICD-10-CM | POA: Diagnosis not present

## 2015-10-24 DIAGNOSIS — M47896 Other spondylosis, lumbar region: Secondary | ICD-10-CM | POA: Diagnosis not present

## 2015-11-05 DIAGNOSIS — H524 Presbyopia: Secondary | ICD-10-CM | POA: Diagnosis not present

## 2015-11-05 DIAGNOSIS — H5213 Myopia, bilateral: Secondary | ICD-10-CM | POA: Diagnosis not present

## 2015-11-05 DIAGNOSIS — Z961 Presence of intraocular lens: Secondary | ICD-10-CM | POA: Diagnosis not present

## 2016-02-25 DIAGNOSIS — Q271 Congenital renal artery stenosis: Secondary | ICD-10-CM | POA: Diagnosis not present

## 2016-02-25 DIAGNOSIS — I10 Essential (primary) hypertension: Secondary | ICD-10-CM | POA: Diagnosis not present

## 2016-03-25 DIAGNOSIS — Z23 Encounter for immunization: Secondary | ICD-10-CM | POA: Diagnosis not present

## 2016-05-04 DIAGNOSIS — M47896 Other spondylosis, lumbar region: Secondary | ICD-10-CM | POA: Diagnosis not present

## 2016-05-04 DIAGNOSIS — M4316 Spondylolisthesis, lumbar region: Secondary | ICD-10-CM | POA: Diagnosis not present

## 2016-07-08 DIAGNOSIS — E785 Hyperlipidemia, unspecified: Secondary | ICD-10-CM | POA: Diagnosis not present

## 2016-07-08 DIAGNOSIS — Z79899 Other long term (current) drug therapy: Secondary | ICD-10-CM | POA: Diagnosis not present

## 2016-07-08 DIAGNOSIS — I10 Essential (primary) hypertension: Secondary | ICD-10-CM | POA: Diagnosis not present

## 2016-07-08 DIAGNOSIS — I701 Atherosclerosis of renal artery: Secondary | ICD-10-CM | POA: Diagnosis not present

## 2016-07-17 DIAGNOSIS — I10 Essential (primary) hypertension: Secondary | ICD-10-CM | POA: Diagnosis not present

## 2016-07-17 DIAGNOSIS — G5601 Carpal tunnel syndrome, right upper limb: Secondary | ICD-10-CM | POA: Diagnosis not present

## 2016-10-07 DIAGNOSIS — M722 Plantar fascial fibromatosis: Secondary | ICD-10-CM | POA: Diagnosis not present

## 2016-10-07 DIAGNOSIS — M79671 Pain in right foot: Secondary | ICD-10-CM | POA: Diagnosis not present

## 2016-11-10 DIAGNOSIS — Z961 Presence of intraocular lens: Secondary | ICD-10-CM | POA: Diagnosis not present

## 2016-11-10 DIAGNOSIS — H524 Presbyopia: Secondary | ICD-10-CM | POA: Diagnosis not present

## 2016-11-10 DIAGNOSIS — H5213 Myopia, bilateral: Secondary | ICD-10-CM | POA: Diagnosis not present

## 2016-11-10 DIAGNOSIS — H52203 Unspecified astigmatism, bilateral: Secondary | ICD-10-CM | POA: Diagnosis not present

## 2016-11-16 DIAGNOSIS — M545 Low back pain: Secondary | ICD-10-CM | POA: Diagnosis not present

## 2016-11-16 DIAGNOSIS — I10 Essential (primary) hypertension: Secondary | ICD-10-CM | POA: Diagnosis not present

## 2017-02-15 ENCOUNTER — Telehealth: Payer: Self-pay | Admitting: Cardiovascular Disease

## 2017-02-15 DIAGNOSIS — I701 Atherosclerosis of renal artery: Secondary | ICD-10-CM

## 2017-02-15 NOTE — Telephone Encounter (Signed)
Savannah Kirk is calling because she is needing to have a Renal Doppler done. Orders need to placed in the system .  Thanks

## 2017-02-15 NOTE — Telephone Encounter (Signed)
Renal artery doppler ordered. Patient last had study in 2015 and was supposed to repeat in 2016 per pending order in EPIC. Patient was PRN f/up with Dr. Gwenlyn Found as of 11/02/2014  Routed to F. Clark to schedule

## 2017-03-10 ENCOUNTER — Ambulatory Visit (HOSPITAL_COMMUNITY)
Admission: RE | Admit: 2017-03-10 | Discharge: 2017-03-10 | Disposition: A | Discharge status: Home / Self Care | Payer: Medicare Other | Source: Ambulatory Visit | Attending: Cardiology | Admitting: Cardiology

## 2017-03-10 DIAGNOSIS — Z95828 Presence of other vascular implants and grafts: Secondary | ICD-10-CM | POA: Diagnosis not present

## 2017-03-10 DIAGNOSIS — I701 Atherosclerosis of renal artery: Secondary | ICD-10-CM | POA: Diagnosis not present

## 2017-03-24 ENCOUNTER — Other Ambulatory Visit: Payer: Self-pay | Admitting: Cardiovascular Disease

## 2017-03-24 DIAGNOSIS — I701 Atherosclerosis of renal artery: Secondary | ICD-10-CM

## 2017-03-29 DIAGNOSIS — F513 Sleepwalking [somnambulism]: Secondary | ICD-10-CM | POA: Diagnosis not present

## 2017-03-29 DIAGNOSIS — Z23 Encounter for immunization: Secondary | ICD-10-CM | POA: Diagnosis not present

## 2017-03-29 DIAGNOSIS — I10 Essential (primary) hypertension: Secondary | ICD-10-CM | POA: Diagnosis not present

## 2017-05-20 DIAGNOSIS — L820 Inflamed seborrheic keratosis: Secondary | ICD-10-CM | POA: Diagnosis not present

## 2017-05-20 DIAGNOSIS — L570 Actinic keratosis: Secondary | ICD-10-CM | POA: Diagnosis not present

## 2017-05-20 DIAGNOSIS — D225 Melanocytic nevi of trunk: Secondary | ICD-10-CM | POA: Diagnosis not present

## 2017-05-20 DIAGNOSIS — R609 Edema, unspecified: Secondary | ICD-10-CM | POA: Diagnosis not present

## 2017-05-20 DIAGNOSIS — X32XXXD Exposure to sunlight, subsequent encounter: Secondary | ICD-10-CM | POA: Diagnosis not present

## 2017-07-23 DIAGNOSIS — I10 Essential (primary) hypertension: Secondary | ICD-10-CM | POA: Diagnosis not present

## 2017-07-23 DIAGNOSIS — Z79899 Other long term (current) drug therapy: Secondary | ICD-10-CM | POA: Diagnosis not present

## 2017-07-30 DIAGNOSIS — I10 Essential (primary) hypertension: Secondary | ICD-10-CM | POA: Diagnosis not present

## 2017-07-30 DIAGNOSIS — M545 Low back pain: Secondary | ICD-10-CM | POA: Diagnosis not present

## 2017-11-30 DIAGNOSIS — Z961 Presence of intraocular lens: Secondary | ICD-10-CM | POA: Diagnosis not present

## 2017-11-30 DIAGNOSIS — H5213 Myopia, bilateral: Secondary | ICD-10-CM | POA: Diagnosis not present

## 2017-11-30 DIAGNOSIS — H52203 Unspecified astigmatism, bilateral: Secondary | ICD-10-CM | POA: Diagnosis not present

## 2017-11-30 DIAGNOSIS — H524 Presbyopia: Secondary | ICD-10-CM | POA: Diagnosis not present

## 2017-12-03 DIAGNOSIS — I10 Essential (primary) hypertension: Secondary | ICD-10-CM | POA: Diagnosis not present

## 2017-12-03 DIAGNOSIS — R031 Nonspecific low blood-pressure reading: Secondary | ICD-10-CM | POA: Diagnosis not present

## 2018-04-07 DIAGNOSIS — G4700 Insomnia, unspecified: Secondary | ICD-10-CM | POA: Diagnosis not present

## 2018-04-07 DIAGNOSIS — I10 Essential (primary) hypertension: Secondary | ICD-10-CM | POA: Diagnosis not present

## 2018-04-07 DIAGNOSIS — Z23 Encounter for immunization: Secondary | ICD-10-CM | POA: Diagnosis not present

## 2018-04-19 DIAGNOSIS — X32XXXD Exposure to sunlight, subsequent encounter: Secondary | ICD-10-CM | POA: Diagnosis not present

## 2018-04-19 DIAGNOSIS — L570 Actinic keratosis: Secondary | ICD-10-CM | POA: Diagnosis not present

## 2018-04-19 DIAGNOSIS — C44329 Squamous cell carcinoma of skin of other parts of face: Secondary | ICD-10-CM | POA: Diagnosis not present

## 2018-04-19 DIAGNOSIS — L820 Inflamed seborrheic keratosis: Secondary | ICD-10-CM | POA: Diagnosis not present

## 2018-04-19 DIAGNOSIS — L728 Other follicular cysts of the skin and subcutaneous tissue: Secondary | ICD-10-CM | POA: Diagnosis not present

## 2018-08-01 DIAGNOSIS — G4700 Insomnia, unspecified: Secondary | ICD-10-CM | POA: Diagnosis not present

## 2018-08-01 DIAGNOSIS — Z79899 Other long term (current) drug therapy: Secondary | ICD-10-CM | POA: Diagnosis not present

## 2018-08-01 DIAGNOSIS — Q271 Congenital renal artery stenosis: Secondary | ICD-10-CM | POA: Diagnosis not present

## 2018-08-01 DIAGNOSIS — I10 Essential (primary) hypertension: Secondary | ICD-10-CM | POA: Diagnosis not present

## 2018-08-08 ENCOUNTER — Encounter: Payer: Self-pay | Admitting: Cardiovascular Disease

## 2018-08-08 DIAGNOSIS — I10 Essential (primary) hypertension: Secondary | ICD-10-CM | POA: Diagnosis not present

## 2018-08-08 DIAGNOSIS — G5601 Carpal tunnel syndrome, right upper limb: Secondary | ICD-10-CM | POA: Diagnosis not present

## 2018-08-08 DIAGNOSIS — I480 Paroxysmal atrial fibrillation: Secondary | ICD-10-CM | POA: Diagnosis not present

## 2018-08-09 ENCOUNTER — Encounter: Payer: Self-pay | Admitting: Cardiovascular Disease

## 2018-08-09 DIAGNOSIS — I4891 Unspecified atrial fibrillation: Secondary | ICD-10-CM | POA: Diagnosis not present

## 2018-08-12 ENCOUNTER — Ambulatory Visit (HOSPITAL_COMMUNITY)
Admission: RE | Admit: 2018-08-12 | Discharge: 2018-08-12 | Disposition: A | Discharge status: Home / Self Care | Payer: Medicare Other | Source: Ambulatory Visit | Attending: Internal Medicine | Admitting: Internal Medicine

## 2018-08-12 ENCOUNTER — Other Ambulatory Visit (HOSPITAL_COMMUNITY): Payer: Self-pay | Admitting: Internal Medicine

## 2018-08-12 DIAGNOSIS — I4891 Unspecified atrial fibrillation: Secondary | ICD-10-CM | POA: Diagnosis not present

## 2018-08-12 NOTE — Progress Notes (Signed)
*  PRELIMINARY RESULTS* Echocardiogram 2D Echocardiogram has been performed.  Samuel Germany 08/12/2018, 10:28 AM

## 2018-08-22 DIAGNOSIS — R200 Anesthesia of skin: Secondary | ICD-10-CM | POA: Diagnosis not present

## 2018-08-22 DIAGNOSIS — M1811 Unilateral primary osteoarthritis of first carpometacarpal joint, right hand: Secondary | ICD-10-CM | POA: Diagnosis not present

## 2018-08-22 DIAGNOSIS — M79644 Pain in right finger(s): Secondary | ICD-10-CM | POA: Diagnosis not present

## 2018-08-22 DIAGNOSIS — R52 Pain, unspecified: Secondary | ICD-10-CM | POA: Diagnosis not present

## 2018-08-23 ENCOUNTER — Other Ambulatory Visit: Payer: Self-pay | Admitting: *Deleted

## 2018-08-23 DIAGNOSIS — R200 Anesthesia of skin: Secondary | ICD-10-CM

## 2018-08-25 ENCOUNTER — Encounter: Payer: Self-pay | Admitting: Cardiovascular Disease

## 2018-08-31 ENCOUNTER — Encounter: Payer: Self-pay | Admitting: Cardiovascular Disease

## 2018-08-31 ENCOUNTER — Ambulatory Visit: Payer: Medicare Other | Admitting: Cardiovascular Disease

## 2018-08-31 DIAGNOSIS — E782 Mixed hyperlipidemia: Secondary | ICD-10-CM | POA: Diagnosis not present

## 2018-08-31 DIAGNOSIS — I701 Atherosclerosis of renal artery: Secondary | ICD-10-CM | POA: Diagnosis not present

## 2018-08-31 DIAGNOSIS — I482 Chronic atrial fibrillation, unspecified: Secondary | ICD-10-CM | POA: Insufficient documentation

## 2018-08-31 DIAGNOSIS — R0609 Other forms of dyspnea: Secondary | ICD-10-CM | POA: Insufficient documentation

## 2018-08-31 DIAGNOSIS — R0600 Dyspnea, unspecified: Secondary | ICD-10-CM

## 2018-08-31 DIAGNOSIS — I10 Essential (primary) hypertension: Secondary | ICD-10-CM

## 2018-08-31 NOTE — Assessment & Plan Note (Signed)
History of essential hypertension her blood pressure measured today at 150/80.  She is on hydrochlorothiazide and amlodipine as well as atenolol.

## 2018-08-31 NOTE — Progress Notes (Signed)
08/31/2018 [place]   Jan 24, 1934  [id number]  Primary Physician Asencion Noble, MD Primary Cardiologist: Lorretta Harp MD FACP, [place], [place], [place]  HPI:  Savannah Kirk is a 85 y.o.  mildly overweight, divorced Caucasian female, mother of 3 who I last saw in the office 11/02/2014.Marland Kitchen She has a history of PAD status post right renal artery PTA and stenting by myself July 06, 2004. We have been following this by duplex ultrasound which was last done August 03, 2011, that was stable. Her other problems include hypertension and hyperlipidemia. Not on a statin drug because of statin intolerance. She also has right lower extremity discomfort diagnosed by Dr. Christella Noa and spinal stenosis with radicular pain improved with steroid injection. Since I saw her a year ago she's remained otherwise asymptomatic. Her most recent l renal Doppler study performed 01/03/14 revealed a widely patent right renal artery stent. Dr. Willey Blade follows her lipid profile. Since I saw her 4 years ago she is done well until 6 months ago when she is noticed increasing dyspnea exertion.  She was found to be in A. fib with RVR and her propanolol was changed to atenolol.  Her heart rate is better controlled.  She denies chest pain.  She was offered oral anticoagulation for stroke prophylaxis but declined because of prior nosebleeds.   Current Meds  Medication Sig  . amLODipine (NORVASC) 2.5 MG tablet Take 1 tablet by mouth daily.  . diazepam (VALIUM) 5 MG tablet Take 5 mg by mouth daily as needed for anxiety.  Marland Kitchen doxazosin (CARDURA) 2 MG tablet Take 1 mg by mouth daily.   . hydrochlorothiazide (HYDRODIURIL) 25 MG tablet Take 25 mg by mouth daily.   Marland Kitchen ibuprofen (ADVIL,MOTRIN) 800 MG tablet Take 800 mg by mouth 3 (three) times daily as needed for moderate pain. For pain  . Omega-3 Fatty Acids (FISH OIL) 1000 MG CAPS Take 1 capsule by mouth daily.  . propranolol (INDERAL) 80 MG tablet Take 160 mg by mouth daily.     Allergies    Allergen Reactions  . Aspirin Other (See Comments)    Nose bleeds  . Penicillins Other (See Comments)    Skins comes off tongue    Social History   Socioeconomic History  . Marital status: Married    Spouse name: Not on file  . Number of children: Not on file  . Years of education: Not on file  . Highest education level: Not on file  Occupational History  . Not on file  Social Needs  . Financial resource strain: Not on file  . Food insecurity:    Worry: Not on file    Inability: Not on file  . Transportation needs:    Medical: Not on file    Non-medical: Not on file  Tobacco Use  . Smoking status: Former Smoker    Types: Cigarettes    Last attempt to quit: 06/14/1985    Years since quitting: 33.2  . Smokeless tobacco: Never Used  Substance and Sexual Activity  . Alcohol use: No  . Drug use: No  . Sexual activity: Not on file  Lifestyle  . Physical activity:    Days per week: Not on file    Minutes per session: Not on file  . Stress: Not on file  Relationships  . Social connections:    Talks on phone: Not on file    Gets together: Not on file    Attends religious service: Not on  file    Active member of club or organization: Not on file    Attends meetings of clubs or organizations: Not on file    Relationship status: Not on file  . Intimate partner violence:    Fear of current or ex partner: Not on file    Emotionally abused: Not on file    Physically abused: Not on file    Forced sexual activity: Not on file  Other Topics Concern  . Not on file  Social History Narrative  . Not on file     Review of Systems: General: negative for chills, fever, night sweats or weight changes.  Cardiovascular: negative for chest pain, dyspnea on exertion, edema, orthopnea, palpitations, paroxysmal nocturnal dyspnea or shortness of breath Dermatological: negative for rash Respiratory: negative for cough or wheezing Urologic: negative for hematuria Abdominal: negative  for nausea, vomiting, diarrhea, bright red blood per rectum, melena, or hematemesis Neurologic: negative for visual changes, syncope, or dizziness All other systems reviewed and are otherwise negative except as noted above.    Blood pressure (!) 150/80, pulse 71, height 5\' 3"  (1.6 m), weight 163 lb (73.9 kg).  General appearance: alert and no distress Neck: no adenopathy, no carotid bruit, no JVD, supple, symmetrical, trachea midline and thyroid not enlarged, symmetric, no tenderness/mass/nodules Lungs: clear to auscultation bilaterally Heart: irregularly irregular rhythm Extremities: extremities normal, atraumatic, no cyanosis or edema Pulses: 2+ and symmetric Skin: Skin color, texture, turgor normal. No rashes or lesions Neurologic: Alert and oriented X 3, normal strength and tone. Normal symmetric reflexes. Normal coordination and gait  EKG not performed today  ASSESSMENT AND PLAN:   Renal artery stenosis History of renal artery stenosis status post right renal artery stenting by myself 07/06/2004.  We have followed her duplex ultrasound up until several years ago.  Essential hypertension History of essential hypertension her blood pressure measured today at 150/80.  She is on hydrochlorothiazide and amlodipine as well as atenolol.  Hyperlipidemia History of hyperlipidemia not on statin therapy followed by her PCP  Atrial fibrillation, chronic History of newly recognized atrial fibrillation rate controlled currently on atenolol the recent 2D echo that showed normal LV systolic function with severe left atrial enlargement.  She has noticed increasing dyspnea over the last 6 months.  She was offered oral anticoagulant for stroke prophylaxis but declined because of prior nosebleeds.  Dyspnea on exertion History of progressive dyspnea on exertion over the last 6 months possibly correlating with new onset A. fib.  I am going to get a pharmacologic Myoview stress test to further  evaluate      Lorretta Harp MD [facility], [facility] 08/31/2018 12:21 PM

## 2018-08-31 NOTE — Assessment & Plan Note (Signed)
History of hyperlipidemia not on statin therapy followed by her PCP 

## 2018-08-31 NOTE — Assessment & Plan Note (Signed)
History of progressive dyspnea on exertion over the last 6 months possibly correlating with new onset A. fib.  I am going to get a pharmacologic Myoview stress test to further evaluate

## 2018-08-31 NOTE — Assessment & Plan Note (Signed)
History of renal artery stenosis status post right renal artery stenting by myself 07/06/2004.  We have followed her duplex ultrasound up until several years ago.

## 2018-08-31 NOTE — Assessment & Plan Note (Signed)
History of newly recognized atrial fibrillation rate controlled currently on atenolol the recent 2D echo that showed normal LV systolic function with severe left atrial enlargement.  She has noticed increasing dyspnea over the last 6 months.  She was offered oral anticoagulant for stroke prophylaxis but declined because of prior nosebleeds.

## 2018-08-31 NOTE — Patient Instructions (Signed)
Medication Instructions:  Your physician recommends that you continue on your current medications as directed. Please refer to the Current Medication list given to you today. If you need a refill on your cardiac medications before your next appointment, please call your pharmacy.   Lab work: NONE If you have labs (blood work) drawn today and your tests are completely normal, you will receive your results only by: Marland Kitchen MyChart Message (if you have MyChart) OR . A paper copy in the mail If you have any lab test that is abnormal or we need to change your treatment, we will call you to review the results.  Testing/Procedures: Your physician has requested that you have a lexiscan myoview. For further information please visit [email or web address]. Please follow instruction sheet, as given.   Follow-Up: At [facility], you and your health needs are our priority.  As part of our continuing mission to provide you with exceptional heart care, we have created designated Provider Care Teams.  These Care Teams include your primary Cardiologist (physician) and Advanced Practice Providers (APPs -  Physician Assistants and Nurse Practitioners) who all work together to provide you with the care you need, when you need it. You will need a follow up appointment in 3 months with Dr. Gwenlyn Found.  Any Other Special Instructions Will Be Listed Below (If Applicable). REFERRAL TO [place]

## 2018-09-01 ENCOUNTER — Encounter: Payer: Self-pay | Admitting: Neurology

## 2018-09-06 ENCOUNTER — Telehealth (HOSPITAL_COMMUNITY): Payer: Self-pay

## 2018-09-06 NOTE — Telephone Encounter (Signed)
Encounter complete. 

## 2018-09-08 ENCOUNTER — Ambulatory Visit (HOSPITAL_COMMUNITY)
Admission: RE | Admit: 2018-09-08 | Discharge: 2018-09-08 | Disposition: A | Discharge status: Home / Self Care | Payer: Medicare Other | Source: Ambulatory Visit | Attending: Cardiology | Admitting: Cardiology

## 2018-09-08 DIAGNOSIS — R0609 Other forms of dyspnea: Secondary | ICD-10-CM | POA: Insufficient documentation

## 2018-09-08 DIAGNOSIS — R0600 Dyspnea, unspecified: Secondary | ICD-10-CM

## 2018-09-08 LAB — MYOCARDIAL PERFUSION IMAGING
Peak HR: 118 {beats}/min
Rest HR: 92 {beats}/min
SDS: 1
SRS: 0
SSS: 1
TID: 1.31

## 2018-09-08 MED ORDER — REGADENOSON 0.4 MG/5ML IV SOLN
0.4000 mg | Freq: Once | INTRAVENOUS | Status: AC
  Administered 2018-09-08: 0.4 mg via INTRAVENOUS

## 2018-09-08 MED ORDER — TECHNETIUM TC 99M TETROFOSMIN IV KIT
10.1000 | PACK | Freq: Once | INTRAVENOUS | Status: AC | PRN
  Administered 2018-09-08: 10.1 via INTRAVENOUS
  Filled 2018-09-08: qty 11

## 2018-09-08 MED ORDER — TECHNETIUM TC 99M TETROFOSMIN IV KIT
31.6000 | PACK | Freq: Once | INTRAVENOUS | Status: AC | PRN
  Administered 2018-09-08: 31.6 via INTRAVENOUS
  Filled 2018-09-08: qty 32

## 2018-09-09 DIAGNOSIS — I482 Chronic atrial fibrillation, unspecified: Secondary | ICD-10-CM | POA: Diagnosis not present

## 2018-09-09 DIAGNOSIS — I10 Essential (primary) hypertension: Secondary | ICD-10-CM | POA: Diagnosis not present

## 2018-09-13 ENCOUNTER — Ambulatory Visit (HOSPITAL_COMMUNITY)
Admission: RE | Admit: 2018-09-13 | Discharge: 2018-09-13 | Disposition: A | Discharge status: Home / Self Care | Payer: Medicare Other | Source: Ambulatory Visit | Attending: Physician Assistant | Admitting: Physician Assistant

## 2018-09-13 VITALS — BP 138/76 | HR 96 | Ht 63.0 in | Wt 164.0 lb

## 2018-09-13 DIAGNOSIS — Z79899 Other long term (current) drug therapy: Secondary | ICD-10-CM | POA: Diagnosis not present

## 2018-09-13 DIAGNOSIS — R0602 Shortness of breath: Secondary | ICD-10-CM | POA: Insufficient documentation

## 2018-09-13 DIAGNOSIS — Z7901 Long term (current) use of anticoagulants: Secondary | ICD-10-CM | POA: Diagnosis not present

## 2018-09-13 DIAGNOSIS — I701 Atherosclerosis of renal artery: Secondary | ICD-10-CM | POA: Diagnosis not present

## 2018-09-13 DIAGNOSIS — I10 Essential (primary) hypertension: Secondary | ICD-10-CM | POA: Insufficient documentation

## 2018-09-13 DIAGNOSIS — E663 Overweight: Secondary | ICD-10-CM | POA: Insufficient documentation

## 2018-09-13 DIAGNOSIS — I4510 Unspecified right bundle-branch block: Secondary | ICD-10-CM | POA: Diagnosis not present

## 2018-09-13 DIAGNOSIS — R5383 Other fatigue: Secondary | ICD-10-CM | POA: Insufficient documentation

## 2018-09-13 DIAGNOSIS — E785 Hyperlipidemia, unspecified: Secondary | ICD-10-CM | POA: Insufficient documentation

## 2018-09-13 DIAGNOSIS — Z87891 Personal history of nicotine dependence: Secondary | ICD-10-CM | POA: Insufficient documentation

## 2018-09-13 DIAGNOSIS — I4819 Other persistent atrial fibrillation: Secondary | ICD-10-CM | POA: Diagnosis not present

## 2018-09-13 DIAGNOSIS — Z6829 Body mass index (BMI) 29.0-29.9, adult: Secondary | ICD-10-CM | POA: Diagnosis not present

## 2018-09-13 MED ORDER — DILTIAZEM HCL ER COATED BEADS 120 MG PO CP24: 120.0000 mg | ORAL_CAPSULE | Freq: Every day | ORAL | 3 refills | Status: DC

## 2018-09-13 MED ORDER — APIXABAN 5 MG PO TABS: 5.0000 mg | ORAL_TABLET | Freq: Two times a day (BID) | ORAL | 0 refills | Status: DC

## 2018-09-13 NOTE — Progress Notes (Signed)
Primary Care Physician: Asencion Noble, MD Primary Cardiologist: Dr Gwenlyn Found Referring Physician: Dr Toy Care is a 85 y.o. female with a history of renal artery stenosis, HTN, HLD, and persistent atrial fibrillation who presents for consultation in the [facility].  The patient was initially diagnosed with atrial fibrillation recently after presenting to her PCP with elevated heart rates. ECG showed atrial fibrillation and her propranolol was changed to atenolol. Since that time, she noted more SOB and fatigue. She was switched back to propranolol and her symptoms resolved. Her Of note, she has refused anticoagulation in the past 2/2 prior nosebleeds. No triggering factors that she can identify. No snoring or significant alcohol use.  Today, she denies symptoms of palpitations, chest pain, shortness of breath, orthopnea, PND, lower extremity edema, dizziness, presyncope, syncope, snoring, daytime somnolence, bleeding, or neurologic sequela. The patient is tolerating medications without difficulties and is otherwise without complaint today.    Atrial Fibrillation Risk Factors:  she does not have symptoms or diagnosis of sleep apnea. she does not have a history of rheumatic fever. she does not have a history of alcohol use. The patient does have a history of early familial atrial fibrillation or other arrhythmias. Daughter also has afib.  she has a BMI of Body mass index is 29.05 kg/m.Marland Kitchen Filed Weights   09/13/18 1115  Weight: 74.4 kg    Family History  Problem Relation Age of Onset  . Hypertension Mother   . Hypertension Sister   . Diabetes Sister      Atrial Fibrillation Management history:  Previous antiarrhythmic drugs: none Previous cardioversions: none Previous ablations: none CHADS2VASC score: (age, female, HTN, PVD) Anticoagulation history: none   Past Medical History:  Diagnosis Date  . Atrial fibrillation ([place])   . Cervical disc  disease   . Hyperlipidemia   . Hypertension   . RBBB (right bundle branch block)   . Renal artery stenosis [facility])    Past Surgical History:  Procedure Laterality Date  . Cardiolite  12/205   negative  . CAROTID STENT  07-06-04  . CATARACT EXTRACTION    . DOPPLER ECHOCARDIOGRAPHY     normal 2D. borderline concentric LVH  . kidney stent    . renal doppler  08/04/2006   patent right renal artery stent  . TONSILLECTOMY      Current Outpatient Medications  Medication Sig Dispense Refill  . diazepam (VALIUM) 5 MG tablet Take 5 mg by mouth daily as needed for anxiety.    Marland Kitchen doxazosin (CARDURA) 2 MG tablet Take 1 mg by mouth daily.     . hydrochlorothiazide (HYDRODIURIL) 25 MG tablet Take 25 mg by mouth daily.     Marland Kitchen ibuprofen (ADVIL,MOTRIN) 800 MG tablet Take 800 mg by mouth 3 (three) times daily as needed for moderate pain. For pain    . Omega-3 Fatty Acids (FISH OIL) 1000 MG CAPS Take 1 capsule by mouth daily.    . propranolol (INDERAL) 80 MG tablet Take 160 mg by mouth daily.    Marland Kitchen apixaban (ELIQUIS) 5 MG TABS tablet Take 1 tablet (5 mg total) by mouth 2 (two) times daily. 60 tablet 0  . diltiazem (CARDIZEM CD) 120 MG 24 hr capsule Take 1 capsule (120 mg total) by mouth daily. 90 capsule 3   No current facility-administered medications for this encounter.     Allergies  Allergen Reactions  . Aspirin Other (See Comments)    Nose bleeds  .  Penicillins Other (See Comments)    Skins comes off tongue    Social History   Socioeconomic History  . Marital status: Married    Spouse name: Not on file  . Number of children: Not on file  . Years of education: Not on file  . Highest education level: Not on file  Occupational History  . Not on file  Social Needs  . Financial resource strain: Not on file  . Food insecurity:    Worry: Not on file    Inability: Not on file  . Transportation needs:    Medical: Not on file    Non-medical: Not on file  Tobacco Use  . Smoking status:  Former Smoker    Types: Cigarettes    Last attempt to quit: 06/14/1985    Years since quitting: 33.2  . Smokeless tobacco: Never Used  Substance and Sexual Activity  . Alcohol use: No  . Drug use: No  . Sexual activity: Not on file  Lifestyle  . Physical activity:    Days per week: Not on file    Minutes per session: Not on file  . Stress: Not on file  Relationships  . Social connections:    Talks on phone: Not on file    Gets together: Not on file    Attends religious service: Not on file    Active member of club or organization: Not on file    Attends meetings of clubs or organizations: Not on file    Relationship status: Not on file  . Intimate partner violence:    Fear of current or ex partner: Not on file    Emotionally abused: Not on file    Physically abused: Not on file    Forced sexual activity: Not on file  Other Topics Concern  . Not on file  Social History Narrative  . Not on file     ROS- All systems are reviewed and negative except as per the HPI above.  Physical Exam: Vitals:   09/13/18 1115  BP: 138/76  Pulse: 96  Weight: 74.4 kg  Height: 5\' 3"  (1.6 m)    GEN- The patient is well appearing elderly female, alert and oriented x 3 today.   Head- normocephalic, atraumatic Eyes-  Sclera clear, conjunctiva pink Ears- hearing intact Oropharynx- clear Neck- supple  Lungs- Clear to ausculation bilaterally, normal work of breathing Heart- irregular rate and rhythm, no murmurs, rubs or gallops  GI- soft, NT, ND, + BS Extremities- no clubbing, cyanosis, or edema MS- no significant deformity or atrophy Skin- no rash or lesion Psych- euthymic mood, full affect Neuro- strength and sensation are intact  Wt Readings from Last 3 Encounters:  09/13/18 74.4 kg  09/08/18 73.9 kg  08/31/18 73.9 kg    EKG today demonstrates afib HR 96, RBBB, QRS 138, QTc 464  Echo 08/12/18 demonstrated  1. The left ventricle has normal systolic function of 33-82%. The  cavity size is normal. There is no left ventricular wall thickness. Echo evidence of indeterminate diastolic filling patterns. Elevated left ventricular end-diastolic pressure. The left  ventricular diastology could not be evaluatedsecondary to atrial fibrillation.  2. Severely dilated left atrial size.  3. Moderately dilated right atrial size.  4. Normal tricuspid valve.  5. Tricuspid regurgitation is mild.  6. The aortic valve tricuspid. There is mild thickening and sclerosis without any evidence of stenosis of the aortic valve.  7. The aortic root is normal is size and structure.  8. The  inferior vena cava was dilated in size with >50% respiratory variablity.  9. No atrial level shunt detected by color flow Doppler. LA 45 mm  Lexiscan 09/08/18  There was no ST segment deviation noted during stress, atrial fibrillation.  The study is normal.  This is a low risk study.  No ischemia noted on perfusion imaging.  Epic records are reviewed at length today  Assessment and Plan:  1. Persistent atrial fibrillation The patient has persistent atrial fibrillation. She is unaware of her arrhythmia. Her dyspnea has resolved since stopping atenolol. We had a long discussion about anticoagulation and her stroke risk. Based on her CHADS2VASC score of 5, she should be on anticoagulation per guidelines. She is hesitant to start 2/2 remote nosebleeds. I emphasized the importance of anticoagulation but stated it is ultimately her decision. She would like to take time to consider it. She did take a hard-copy prescription for Eliquis 5 mg BID and will let us know if/when she fills it. Recent lab work by PCP noted. Good renal function. Will stop Norvasc and add diltiazem 120 mg daily for additional rate control. Continue propranolol 160 mg daily Would not pursue rhythm control unless she is compliant with anticoagulation.  This patients CHA2DS2-VASc Score and unadjusted Ischemic Stroke Rate (% per year)  is equal to 7.2 % stroke rate/year from a score of 5  Above score calculated as 1 point each if present [CHF, HTN, DM, Vascular=MI/PAD/Aortic Plaque, Age if 65-74, or Female] Above score calculated as 2 points each if present [Age > 75, or Stroke/TIA/TE]  2. Overweight Lifestyle modifications encouraged including regular physical activity and weight loss.  3. HTN Stable, changes as above.   Follow up in [facility] Clinic in 1 month. Follow up with Dr Gwenlyn Found and Dr Willey Blade as scheduled.   [facility] [address] [place], [place] [address] [phone] 09/13/2018 12:48 PM

## 2018-09-13 NOTE — Patient Instructions (Signed)
Stop amlodipine   Start Diltiazem 120 mg one capsule daily

## 2018-09-27 DIAGNOSIS — R1032 Left lower quadrant pain: Secondary | ICD-10-CM | POA: Diagnosis not present

## 2018-10-18 ENCOUNTER — Other Ambulatory Visit (HOSPITAL_COMMUNITY): Payer: Self-pay | Admitting: *Deleted

## 2018-10-18 ENCOUNTER — Ambulatory Visit (HOSPITAL_COMMUNITY)
Admission: RE | Admit: 2018-10-18 | Discharge: 2018-10-18 | Disposition: A | Discharge status: Home / Self Care | Payer: Medicare Other | Source: Ambulatory Visit | Attending: Physician Assistant | Admitting: Physician Assistant

## 2018-10-18 ENCOUNTER — Other Ambulatory Visit: Payer: Self-pay

## 2018-10-18 DIAGNOSIS — I4819 Other persistent atrial fibrillation: Secondary | ICD-10-CM

## 2018-10-18 NOTE — Progress Notes (Signed)
Electrophysiology TeleHealth Note   Due to national recommendations of social distancing due to [place] 19, Audio telehealth visit is felt to be most appropriate for this patient at this time.  See consent below from today for patient consent regarding telehealth for the Atrial Fibrillation Clinic. Consent obtained verbally.   Date:  10/18/2018   ID:  Savannah Kirk, DOB [birth year], MRN [id number]  Location: home  Provider location: [address] [place], [place] [address] Evaluation Performed: Follow up  PCP:  Asencion Noble, MD  Primary Cardiologist:  Dr Gwenlyn Found  CC: Follow up for atrial fibrillation   History of Present Illness: Savannah Kirk is a 85 y.o. female who presents via audio conferencing for a telehealth visit today. Patient reports that she has done very well since her last visit. Since changing to diltiazem, she reports that her BP is usually in the 120s/60s and her heart rates are in the 70s. No heart racing or palpitations. She states that she did start Eliquis 5 mg BID on 10/12/18. No bleeding issues. She has also starting walking daily.  Today, she denies symptoms of palpitations, chest pain, shortness of breath, orthopnea, PND, lower extremity edema, claudication, dizziness, presyncope, syncope, bleeding, or neurologic sequela. The patient is tolerating medications without difficulties and is otherwise without complaint today.   she denies symptoms of cough, fevers, chills, or new SOB worrisome for COVID 19.     Atrial Fibrillation Risk Factors:  she does not have symptoms or diagnosis of sleep apnea. she does not have a history of rheumatic fever. she does not have a history of alcohol use. The patient does have a history of early familial atrial fibrillation or other arrhythmias. Daughter has afib.  she has a BMI of There is no height or weight on file to calculate BMI..  BP 121/54 Pulse 71 Provided by pt with home BP machine.  Past Medical History:  Diagnosis Date   . Atrial fibrillation ([place])   . Cervical disc disease   . Hyperlipidemia   . Hypertension   . RBBB (right bundle branch block)   . Renal artery stenosis [facility])    Past Surgical History:  Procedure Laterality Date  . Cardiolite  12/205   negative  . CAROTID STENT  07-06-04  . CATARACT EXTRACTION    . DOPPLER ECHOCARDIOGRAPHY     normal 2D. borderline concentric LVH  . kidney stent    . renal doppler  08/04/2006   patent right renal artery stent  . TONSILLECTOMY       Current Outpatient Medications  Medication Sig Dispense Refill  . apixaban (ELIQUIS) 5 MG TABS tablet Take 1 tablet (5 mg total) by mouth 2 (two) times daily. 60 tablet 0  . diazepam (VALIUM) 5 MG tablet Take 5 mg by mouth daily as needed for anxiety.    Marland Kitchen diltiazem (CARDIZEM CD) 120 MG 24 hr capsule Take 1 capsule (120 mg total) by mouth daily. 90 capsule 3  . doxazosin (CARDURA) 2 MG tablet Take 1 mg by mouth daily.     . hydrochlorothiazide (HYDRODIURIL) 25 MG tablet Take 25 mg by mouth daily.     Marland Kitchen ibuprofen (ADVIL,MOTRIN) 800 MG tablet Take 800 mg by mouth 3 (three) times daily as needed for moderate pain. For pain    . Omega-3 Fatty Acids (FISH OIL) 1000 MG CAPS Take 1 capsule by mouth daily.    . propranolol (INDERAL) 80 MG tablet Take 160 mg by mouth daily.  No current facility-administered medications for this encounter.     Allergies:   Aspirin and Penicillins   Social History:  The patient  reports that she quit smoking about 33 years ago. Her smoking use included cigarettes. She has never used smokeless tobacco. She reports that she does not drink alcohol or use drugs.   Family History:  The patient's  family history includes Diabetes in her sister; Hypertension in her mother and sister.    ROS:  Please see the history of present illness.   All other systems are personally reviewed and negative.   Recent Labs: No results found for requested labs within last 8760 hours.  personally reviewed     Other studies personally reviewed: Additional studies/ records that were reviewed today include: Epic notes   Echo 08/12/18 demonstrated  1. The left ventricle has normal systolic function of 27-78%. The cavity size is normal. There is no left ventricular wall thickness. Echo evidence of indeterminate diastolic filling patterns. Elevated left ventricular end-diastolic pressure. The left  ventricular diastology could not be evaluatedsecondary to atrial fibrillation. 2. Severely dilated left atrial size. 3. Moderately dilated right atrial size. 4. Normal tricuspid valve. 5. Tricuspid regurgitation is mild. 6. The aortic valve tricuspid. There is mild thickening and sclerosis without any evidence of stenosis of the aortic valve. 7. The aortic root is normal is size and structure. 8. The inferior vena cava was dilated in size with >50% respiratory variablity. 9. No atrial level shunt detected by color flow Doppler. LA 45 mm   ASSESSMENT AND PLAN:  1. Persistent atrial fibrillation Patient appears unaware of her arrhythmia. Actually, has had more energy recently and is exercising regularly. Patient happy with present therapy. With no symptoms, advanced age, preserved EF, severely dilated LA, and controlled heart rates, she may be a good candidate for rate control strategy.  Continue diltiazem 120 mg daily Continue propranolol 160 mg daily Continue Eliquis 5 mg BID.  Check Bmet/CBC in 3-4 weeks.  This patients CHA2DS2-VASc Score and unadjusted Ischemic Stroke Rate (% per year) is equal to 7.2 % stroke rate/year from a score of 5  Above score calculated as 1 point each if present [CHF, HTN, DM, Vascular=MI/PAD/Aortic Plaque, Age if 65-74, or Female] Above score calculated as 2 points each if present [Age > 75, or Stroke/TIA/TE]  2. HTN Stable, no changes today.   COVID screen The patient does not have any symptoms that suggest any further testing/ screening at this time.  Social  distancing reinforced today.    Follow-up with Dr Gwenlyn Found as scheduled. Afib clinic in 3 months.  Current medicines are reviewed at length with the patient today.   The patient does not have concerns regarding her medicines.  The following changes were made today:  none  Labs/ tests ordered today include: Bmet/CBC   Patient Risk:  after full review of this patients clinical status, I feel that they are at moderate risk at this time.   Today, I have spent 24 minutes with the patient with telehealth technology discussing atrial fibrillation, Eliquis, lifestyle modifications, and COVID-19 precautions.    Gwenlyn Perking PA-C 10/18/2018 11:53 AM  Afib [facility] [address] [place], [place] [address] [phone]   I hereby voluntarily request, consent and authorize the [facility] and its employed or contracted physicians, physician assistants, nurse practitioners or other licensed health care professionals (the Practitioner), to provide me with telemedicine health care services (the "Services") as deemed necessary by  the treating Practitioner. I acknowledge and consent to receive the Services by the Practitioner via telemedicine. I understand that the telemedicine visit will involve communicating with the Practitioner through live audiovisual communication technology and the disclosure of certain medical information by electronic transmission. I acknowledge that I have been given the opportunity to request an in-person assessment or other available alternative prior to the telemedicine visit and am voluntarily participating in the telemedicine visit.   I understand that I have the right to withhold or withdraw my consent to the use of telemedicine in the course of my care at any time, without affecting my right to future care or treatment, and that the Practitioner or I may terminate the telemedicine visit at any time. I understand that I have the right  to inspect all information obtained and/or recorded in the course of the telemedicine visit and may receive copies of available information for a reasonable fee.  I understand that some of the potential risks of receiving the Services via telemedicine include:   Delay or interruption in medical evaluation due to technological equipment failure or disruption;  Information transmitted may not be sufficient (e.g. poor resolution of images) to allow for appropriate medical decision making by the Practitioner; and/or  In rare instances, security protocols could fail, causing a breach of personal health information.   Furthermore, I acknowledge that it is my responsibility to provide information about my medical history, conditions and care that is complete and accurate to the best of my ability. I acknowledge that Practitioner's advice, recommendations, and/or decision may be based on factors not within their control, such as incomplete or inaccurate data provided by me or distortions of diagnostic images or specimens that may result from electronic transmissions. I understand that the practice of medicine is not an exact science and that Practitioner makes no warranties or guarantees regarding treatment outcomes. I acknowledge that I will receive a copy of this consent concurrently upon execution via email to the email address I last provided but may also request a printed copy by calling the office of the [facility].  I understand that my insurance will be billed for this visit.   I have read or had this consent read to me.  I understand the contents of this consent, which adequately explains the benefits and risks of the Services being provided via telemedicine.  I have been provided ample opportunity to ask questions regarding this consent and the Services and have had my questions answered to my satisfaction.  I give my informed consent for the services to be provided through the use of  telemedicine in my medical care  By participating in this telemedicine visit I agree to the above.

## 2018-11-03 ENCOUNTER — Other Ambulatory Visit (HOSPITAL_COMMUNITY): Payer: Self-pay | Admitting: *Deleted

## 2018-11-03 NOTE — Telephone Encounter (Signed)
error 

## 2018-11-09 ENCOUNTER — Other Ambulatory Visit (HOSPITAL_COMMUNITY): Payer: Self-pay | Admitting: Physician Assistant

## 2018-11-09 ENCOUNTER — Telehealth (HOSPITAL_COMMUNITY): Payer: Self-pay | Admitting: *Deleted

## 2018-11-09 DIAGNOSIS — I10 Essential (primary) hypertension: Secondary | ICD-10-CM | POA: Diagnosis not present

## 2018-11-09 DIAGNOSIS — R109 Unspecified abdominal pain: Secondary | ICD-10-CM | POA: Diagnosis not present

## 2018-11-09 DIAGNOSIS — I4819 Other persistent atrial fibrillation: Secondary | ICD-10-CM | POA: Diagnosis not present

## 2018-11-09 DIAGNOSIS — I482 Chronic atrial fibrillation, unspecified: Secondary | ICD-10-CM | POA: Diagnosis not present

## 2018-11-09 NOTE — Telephone Encounter (Signed)
Patient called wanting to know if she could decrease her Eliquis to 2.5mg  instead of 5mg  because she has been having some intermittent "kidney pains" since she started the Eliquis (after 10 days). Pt denies blood in her urine nor any symptoms of urinary frequency/urgency. Pt called her PCP prior to calling this RN and is set up to have urine specimen/culture. The pt states her right kidney hurt (to the touch) for a day or so this dull pain then it went away then she noticed the left kidney did the same for a few days - she has not had any pain in the last week. Pt states she does have history of kidney stones back in the 1980s but knows this is not kidney stone b/c she knows the difference in the pain. Pt does have a history of stent in one kidney. Pt does say that she is staying well hydrated but did notice the days she had the kidney pain if she drank more water it would help the pain. Pt will have urine results faxed to our office and she is having her CBC/BMET drawn as well today/tomorrow. Told pt I would discuss with Adline Peals, PA in regards to any known issues with kidney pain and Eliquis but she should continue Eliquis at 5mg  twice a day for now. Pt in agreement.

## 2018-11-09 NOTE — Telephone Encounter (Signed)
Per Adline Peals, PA - after review of literature no previous correlation between kidney pain and eliquis usage. Should follow up with PCP recommendations for any continued kidney pain for possibility of UTI/kidney stone as pt has history of both of these.

## 2018-11-10 LAB — BASIC METABOLIC PANEL
BUN/Creatinine Ratio: 15 (ref 12–28)
BUN: 13 mg/dL (ref 8–27)
CO2: 24 mmol/L (ref 20–29)
Calcium: 9.4 mg/dL (ref 8.7–10.3)
Chloride: 98 mmol/L (ref 96–106)
Creatinine, Ser: 0.88 mg/dL (ref 0.57–1.00)
GFR calc Af Amer: 71 mL/min/{1.73_m2} (ref >59)
GFR calc non Af Amer: 61 mL/min/{1.73_m2} (ref >59)
Glucose: 136 mg/dL — ABNORMAL HIGH (ref 65–99)
Potassium: 3.8 mmol/L (ref 3.5–5.2)
Sodium: 139 mmol/L (ref 134–144)

## 2018-11-10 LAB — CBC
Hematocrit: 38.3 % (ref 34.0–46.6)
Hemoglobin: 12.9 g/dL (ref 11.1–15.9)
MCH: 30.4 pg (ref 26.6–33.0)
MCHC: 33.7 g/dL (ref 31.5–35.7)
MCV: 90 fL (ref 79–97)
Platelets: 255 10*3/uL (ref 150–450)
RBC: 4.25 x10E6/uL (ref 3.77–5.28)
RDW: 14.1 % (ref 11.7–15.4)
WBC: 8.6 10*3/uL (ref 3.4–10.8)

## 2018-11-29 ENCOUNTER — Ambulatory Visit: Payer: Medicare Other | Admitting: Cardiovascular Disease

## 2018-12-01 DIAGNOSIS — H5213 Myopia, bilateral: Secondary | ICD-10-CM | POA: Diagnosis not present

## 2018-12-01 DIAGNOSIS — H524 Presbyopia: Secondary | ICD-10-CM | POA: Diagnosis not present

## 2018-12-01 DIAGNOSIS — Z961 Presence of intraocular lens: Secondary | ICD-10-CM | POA: Diagnosis not present

## 2018-12-01 DIAGNOSIS — H52203 Unspecified astigmatism, bilateral: Secondary | ICD-10-CM | POA: Diagnosis not present

## 2018-12-21 DIAGNOSIS — G5601 Carpal tunnel syndrome, right upper limb: Secondary | ICD-10-CM | POA: Diagnosis not present

## 2018-12-21 DIAGNOSIS — M1811 Unilateral primary osteoarthritis of first carpometacarpal joint, right hand: Secondary | ICD-10-CM | POA: Diagnosis not present

## 2018-12-21 DIAGNOSIS — M79604 Pain in right leg: Secondary | ICD-10-CM | POA: Diagnosis not present

## 2018-12-23 ENCOUNTER — Other Ambulatory Visit: Payer: Self-pay | Admitting: Orthopedic Surgery

## 2018-12-26 ENCOUNTER — Telehealth: Payer: Self-pay

## 2018-12-26 NOTE — Telephone Encounter (Signed)
   [facility] Pre-operative Risk Assessment    Request for surgical clearance:  1. What type of surgery is being performed? Right Carpal tunnel   2. When is this surgery scheduled? 02/07/2019   3. What type of clearance is required (medical clearance vs. Pharmacy clearance to hold med vs. Both)? medical  4. Are there any medications that need to be held prior to surgery and how long?   5. Practice name and name of physician performing surgery? [facility]   6. What is your office phone number [phone]   7.   What is your office fax number [phone]  8.   Anesthesia type (None, local, MAC, general) ? Iv regional/FAB   Crissie Reese 12/26/2018, 4:25 PM  _________________________________________________________________   (provider comments below)

## 2018-12-27 NOTE — Telephone Encounter (Signed)
   Primary Cardiologist: Quay Burow, MD  Chart reviewed as part of pre-operative protocol coverage. Left voice mail to call back.   [employer], [place] 12/27/2018, 9:30 AM

## 2018-12-30 NOTE — Telephone Encounter (Signed)
Patient with diagnosis of afib on Eliquis for anticoagulation.    Procedure: Right Carpal tunnel  Date of procedure: 02/07/2019  CHADS2-VASc score of  5 (CHF, HTN, AGE, DM2, stroke/tia x 2, CAD, AGE, female)  CrCl 30ml/min  Per office protocol, patient can hold Eliquis for 2 days prior to procedure.

## 2018-12-30 NOTE — Telephone Encounter (Signed)
Although not requested, I would anticipate that the surgeon would want to hold eliquis for surgery.  I will route to our pharmacy anticoagulant recommendation.

## 2019-01-03 NOTE — Telephone Encounter (Addendum)
Covering preop today. Called patient but just rang and rang without going to VM. Will need to try again at another time.

## 2019-01-04 NOTE — Telephone Encounter (Signed)
   Primary Cardiologist: Quay Burow, MD  Chart reviewed as part of pre-operative protocol coverage. Patient was contacted 01/04/2019 in reference to pre-operative risk assessment for pending surgery as outlined below.  Savannah Kirk was last seen on 08/31/2018 by Dr. Gwenlyn Found.  Since that day, Savannah Kirk has done well without any exertional chest pain or shortness of breath. Per patient, she walks about 6 blocks on a daily basis and can achieve at least 4 METS of activity.   Therefore, based on ACC/AHA guidelines, the patient would be at acceptable risk for the planned procedure without further cardiovascular testing.   I will route this recommendation to the requesting party via Epic fax function and remove from pre-op pool.  Please call with questions. I have informed the patient to hold Eliquis for 2 days prior to surgery, she will need to restart Eliquis after surgery as soon as possible at the discretion of the surgeon based on bleeding risk.  [place], [place] 01/04/2019, 10:28 AM

## 2019-01-18 ENCOUNTER — Ambulatory Visit (HOSPITAL_COMMUNITY)
Admission: RE | Admit: 2019-01-18 | Discharge: 2019-01-18 | Disposition: A | Discharge status: Home / Self Care | Payer: Medicare Other | Source: Ambulatory Visit | Attending: Physician Assistant | Admitting: Physician Assistant

## 2019-01-18 ENCOUNTER — Other Ambulatory Visit: Payer: Self-pay

## 2019-01-18 VITALS — BP 132/80 | HR 79 | Ht 63.0 in | Wt 167.0 lb

## 2019-01-18 DIAGNOSIS — Z7901 Long term (current) use of anticoagulants: Secondary | ICD-10-CM | POA: Insufficient documentation

## 2019-01-18 DIAGNOSIS — I701 Atherosclerosis of renal artery: Secondary | ICD-10-CM | POA: Insufficient documentation

## 2019-01-18 DIAGNOSIS — I4510 Unspecified right bundle-branch block: Secondary | ICD-10-CM | POA: Diagnosis not present

## 2019-01-18 DIAGNOSIS — Z87891 Personal history of nicotine dependence: Secondary | ICD-10-CM | POA: Insufficient documentation

## 2019-01-18 DIAGNOSIS — Z888 Allergy status to other drugs, medicaments and biological substances status: Secondary | ICD-10-CM | POA: Insufficient documentation

## 2019-01-18 DIAGNOSIS — E785 Hyperlipidemia, unspecified: Secondary | ICD-10-CM | POA: Insufficient documentation

## 2019-01-18 DIAGNOSIS — E663 Overweight: Secondary | ICD-10-CM | POA: Insufficient documentation

## 2019-01-18 DIAGNOSIS — I4819 Other persistent atrial fibrillation: Secondary | ICD-10-CM | POA: Insufficient documentation

## 2019-01-18 DIAGNOSIS — Z6829 Body mass index (BMI) 29.0-29.9, adult: Secondary | ICD-10-CM | POA: Insufficient documentation

## 2019-01-18 DIAGNOSIS — Z880 Allergy status to penicillin: Secondary | ICD-10-CM | POA: Insufficient documentation

## 2019-01-18 DIAGNOSIS — Z833 Family history of diabetes mellitus: Secondary | ICD-10-CM | POA: Diagnosis not present

## 2019-01-18 DIAGNOSIS — I10 Essential (primary) hypertension: Secondary | ICD-10-CM | POA: Insufficient documentation

## 2019-01-18 DIAGNOSIS — Z79899 Other long term (current) drug therapy: Secondary | ICD-10-CM | POA: Diagnosis not present

## 2019-01-18 DIAGNOSIS — Z8249 Family history of ischemic heart disease and other diseases of the circulatory system: Secondary | ICD-10-CM | POA: Diagnosis not present

## 2019-01-18 DIAGNOSIS — R9431 Abnormal electrocardiogram [ECG] [EKG]: Secondary | ICD-10-CM | POA: Diagnosis not present

## 2019-01-18 NOTE — Patient Instructions (Signed)
Coumadin clinic will be in touch with you to help with switching over to coumadin

## 2019-01-18 NOTE — Progress Notes (Signed)
Primary Care Physician: Asencion Noble, MD Primary Cardiologist: Dr Gwenlyn Found Referring Physician: Dr Toy Care is a 85 y.o. female with a history of renal artery stenosis, HTN, HLD, and persistent atrial fibrillation who presents for consultation in the [facility].  The patient was initially diagnosed with atrial fibrillation recently after presenting to her PCP with elevated heart rates. ECG showed atrial fibrillation and her propranolol was changed to atenolol. Since that time, she noted more SOB and fatigue. She was switched back to propranolol and her symptoms resolved. Her Of note, she has refused anticoagulation in the past 2/2 prior nosebleeds. No triggering factors that she can identify. No snoring or significant alcohol use.  On follow up today, patient reports that she continues to be very active and unaware of her arrhythmia. However, patient reports that she had "kidney pain" on full dose Eliquis and decided to cut the pill in half. She has been on Eliquis 2.5 for the last month. Of note, she was treated for a UTI at the time she had the flank discomfort.   Today, she denies symptoms of palpitations, chest pain, shortness of breath, orthopnea, PND, lower extremity edema, dizziness, presyncope, syncope, snoring, daytime somnolence, bleeding, or neurologic sequela. The patient is tolerating medications without difficulties and is otherwise without complaint today.    Atrial Fibrillation Risk Factors:  she does not have symptoms or diagnosis of sleep apnea. she does not have a history of rheumatic fever. she does not have a history of alcohol use. The patient does have a history of early familial atrial fibrillation or other arrhythmias. Daughter also has afib.  she has a BMI of Body mass index is 29.58 kg/m.Marland Kitchen Filed Weights   01/18/19 1040  Weight: 75.8 kg    Family History  Problem Relation Age of Onset  . Hypertension Mother   .  Hypertension Sister   . Diabetes Sister      Atrial Fibrillation Management history:  Previous antiarrhythmic drugs: none Previous cardioversions: none Previous ablations: none CHADS2VASC score: 90 (age, female, HTN, PVD) Anticoagulation history: Eliquis   Past Medical History:  Diagnosis Date  . Atrial fibrillation ([place])   . Cervical disc disease   . Hyperlipidemia   . Hypertension   . RBBB (right bundle branch block)   . Renal artery stenosis [facility])    Past Surgical History:  Procedure Laterality Date  . Cardiolite  12/205   negative  . CAROTID STENT  07-06-04  . CATARACT EXTRACTION    . DOPPLER ECHOCARDIOGRAPHY     normal 2D. borderline concentric LVH  . kidney stent    . renal doppler  08/04/2006   patent right renal artery stent  . TONSILLECTOMY      Current Outpatient Medications  Medication Sig Dispense Refill  . apixaban (ELIQUIS) 5 MG TABS tablet Take 1 tablet (5 mg total) by mouth 2 (two) times daily. (Patient taking differently: Take 2.5 mg by mouth 2 (two) times daily. ) 60 tablet 0  . diazepam (VALIUM) 5 MG tablet Take 5 mg by mouth daily as needed for anxiety.    Marland Kitchen diltiazem (CARDIZEM CD) 120 MG 24 hr capsule Take 1 capsule (120 mg total) by mouth daily. 90 capsule 3  . doxazosin (CARDURA) 2 MG tablet Take 1 mg by mouth daily.     . hydrochlorothiazide (HYDRODIURIL) 25 MG tablet Take 25 mg by mouth daily.     Marland Kitchen ibuprofen (ADVIL,MOTRIN) 800 MG tablet Take  800 mg by mouth 3 (three) times daily as needed for moderate pain. For pain    . Omega-3 Fatty Acids (FISH OIL) 1000 MG CAPS Take 1 capsule by mouth daily.    . propranolol (INDERAL) 80 MG tablet Take 160 mg by mouth daily.     No current facility-administered medications for this encounter.     Allergies  Allergen Reactions  . Aspirin Other (See Comments)    Nose bleeds  . Penicillins Other (See Comments)    Skins comes off tongue    Social History   Socioeconomic History  . Marital status:  Married    Spouse name: Not on file  . Number of children: Not on file  . Years of education: Not on file  . Highest education level: Not on file  Occupational History  . Not on file  Social Needs  . Financial resource strain: Not on file  . Food insecurity    Worry: Not on file    Inability: Not on file  . Transportation needs    Medical: Not on file    Non-medical: Not on file  Tobacco Use  . Smoking status: Former Smoker    Types: Cigarettes    Quit date: 06/14/1985    Years since quitting: 33.6  . Smokeless tobacco: Never Used  Substance and Sexual Activity  . Alcohol use: No  . Drug use: No  . Sexual activity: Not on file  Lifestyle  . Physical activity    Days per week: Not on file    Minutes per session: Not on file  . Stress: Not on file  Relationships  . Social Herbalist on phone: Not on file    Gets together: Not on file    Attends religious service: Not on file    Active member of club or organization: Not on file    Attends meetings of clubs or organizations: Not on file    Relationship status: Not on file  . Intimate partner violence    Fear of current or ex partner: Not on file    Emotionally abused: Not on file    Physically abused: Not on file    Forced sexual activity: Not on file  Other Topics Concern  . Not on file  Social History Narrative  . Not on file     ROS- All systems are reviewed and negative except as per the HPI above.  Physical Exam: Vitals:   01/18/19 1040  BP: 132/80  Pulse: 79  Weight: 75.8 kg  Height: 5\' 3"  (1.6 m)    GEN- The patient is well appearing elderly female, alert and oriented x 3 today.   HEENT-head normocephalic, atraumatic, sclera clear, conjunctiva pink, hearing intact, trachea midline. Lungs- Clear to ausculation bilaterally, normal work of breathing Heart- irregular rate and rhythm, no murmurs, rubs or gallops  GI- soft, NT, ND, + BS Extremities- no clubbing, cyanosis, or edema MS- no  significant deformity or atrophy Skin- no rash or lesion Psych- euthymic mood, full affect Neuro- strength and sensation are intact   Wt Readings from Last 3 Encounters:  01/18/19 75.8 kg  09/13/18 74.4 kg  09/08/18 73.9 kg    EKG today demonstrates afib HR 79, RBBB, NST, QRS 142, QTc 470  Echo 08/12/18 demonstrated  1. The left ventricle has normal systolic function of 96-28%. The cavity size is normal. There is no left ventricular wall thickness. Echo evidence of indeterminate diastolic filling patterns. Elevated left ventricular  end-diastolic pressure. The left  ventricular diastology could not be evaluatedsecondary to atrial fibrillation.  2. Severely dilated left atrial size.  3. Moderately dilated right atrial size.  4. Normal tricuspid valve.  5. Tricuspid regurgitation is mild.  6. The aortic valve tricuspid. There is mild thickening and sclerosis without any evidence of stenosis of the aortic valve.  7. The aortic root is normal is size and structure.  8. The inferior vena cava was dilated in size with >50% respiratory variablity.  9. No atrial level shunt detected by color flow Doppler. LA 45 mm  Lexiscan 09/08/18  There was no ST segment deviation noted during stress, atrial fibrillation.  The study is normal.  This is a low risk study.  No ischemia noted on perfusion imaging.  Epic records are reviewed at length today  Assessment and Plan:  1. Persistent atrial fibrillation The patient has persistent atrial fibrillation and remains asymptomatic. Given advanced age, rate control, and lack of symptoms, she has opted for rate control at this time. We had a very in depth discussion about her stroke risk and the risks and benefits of anticoagulation. We discussed why Eliquis 2.5 mg BID does not fully protect her from stroke. Patient is hesitant to be on full dose anticoagulation. I emphasized the importance of stroke prevention but also indicated that it is ultimately  her decision.  At this time, patient would like to change to warfarin for anticoagulation.  Continue diltiazem 120 mg daily Continue propranolol 160 mg daily Continue Eliquis 5 mg BID until INR therapeutic.   This patients CHA2DS2-VASc Score and unadjusted Ischemic Stroke Rate (% per year) is equal to 7.2 % stroke rate/year from a score of 5  Above score calculated as 1 point each if present [CHF, HTN, DM, Vascular=MI/PAD/Aortic Plaque, Age if 65-74, or Female] Above score calculated as 2 points each if present [Age > 75, or Stroke/TIA/TE]  2. Overweight Body mass index is 29.58 kg/m. Lifestyle modification was discussed and encouraged including regular physical activity and weight reduction.  3. HTN Stable, no changes today.   Follow up in AF clinic in 3 months.    [facility] [address] [place], [place] [address] [phone] 01/18/2019 12:21 PM

## 2019-01-19 ENCOUNTER — Telehealth: Payer: Self-pay | Admitting: *Deleted

## 2019-01-19 NOTE — Telephone Encounter (Signed)
Called and spoke with patient. Wants to transition from Eliquis to Coumadin.  She is scheduled for Carpel Tunnel surgery 7/28.  Pt has enough Eliquis for appx 5-6 weeks.  Advised pt to stay on Eliquis till after surgery since she has enough tablets on hand and as it is easier to manage before surgery.  She is in agreement.  She will call me the first week of August and set up a time to transition to Coumadin.  She will need to overlap the two x 3 days before stopping Eliquis.

## 2019-01-19 NOTE — Telephone Encounter (Signed)
-----   Message from Juluis Mire, RN sent at 01/19/2019 11:10 AM EDT ----- Regarding: coumadin start Pt would like to transition from eliquis to coumadin (see Adline Peals note). She would like to be followed in the [place] office (aware she may have to travel to other site until reopened) She has carpel tunnel surg end of month and has enough eliquis to last at least 3 weeks. I Told pt to await your call for next steps to transition. ThanksStacy RN AF Clinic

## 2019-01-31 ENCOUNTER — Other Ambulatory Visit: Payer: Self-pay

## 2019-01-31 ENCOUNTER — Telehealth: Payer: Self-pay | Admitting: *Deleted

## 2019-01-31 ENCOUNTER — Encounter (HOSPITAL_BASED_OUTPATIENT_CLINIC_OR_DEPARTMENT_OTHER): Payer: Self-pay | Admitting: *Deleted

## 2019-01-31 ENCOUNTER — Telehealth (HOSPITAL_COMMUNITY): Payer: Self-pay | Admitting: *Deleted

## 2019-01-31 NOTE — Telephone Encounter (Signed)
-----   Message from Juluis Mire, RN sent at 01/31/2019  8:47 AM EDT ----- Regarding: coumadin/eliquis Patient has been approved for eliquis assistance so she would prefer to stay on eliquis instead of changing to coumadin....  Thank you Nurse, adult

## 2019-01-31 NOTE — Telephone Encounter (Signed)
Patient approved for eliquis patient assistance through 07/13/19. Case ID #[id number]

## 2019-01-31 NOTE — Telephone Encounter (Signed)
Noted  

## 2019-02-03 ENCOUNTER — Other Ambulatory Visit (HOSPITAL_COMMUNITY): Payer: Medicare Other

## 2019-02-07 ENCOUNTER — Ambulatory Visit (HOSPITAL_BASED_OUTPATIENT_CLINIC_OR_DEPARTMENT_OTHER): Admission: RE | Admit: 2019-02-07 | Payer: Medicare Other | Source: Home / Self Care | Admitting: Orthopedic Surgery

## 2019-02-07 HISTORY — DX: Epistaxis: R04.0

## 2019-02-07 SURGERY — CARPAL TUNNEL RELEASE
Anesthesia: Regional | Laterality: Right

## 2019-03-06 ENCOUNTER — Ambulatory Visit: Payer: Medicare Other | Admitting: *Deleted

## 2019-03-06 ENCOUNTER — Other Ambulatory Visit: Payer: Self-pay | Admitting: *Deleted

## 2019-03-06 MED ORDER — WARFARIN SODIUM 5 MG PO TABS: 5.0000 mg | ORAL_TABLET | Freq: Every day | ORAL | 1 refills | Status: DC

## 2019-03-13 ENCOUNTER — Other Ambulatory Visit: Payer: Self-pay

## 2019-03-13 ENCOUNTER — Ambulatory Visit (INDEPENDENT_AMBULATORY_CARE_PROVIDER_SITE_OTHER): Payer: Medicare Other | Admitting: *Deleted

## 2019-03-13 DIAGNOSIS — Z5181 Encounter for therapeutic drug level monitoring: Secondary | ICD-10-CM | POA: Insufficient documentation

## 2019-03-13 DIAGNOSIS — I4819 Other persistent atrial fibrillation: Secondary | ICD-10-CM | POA: Diagnosis not present

## 2019-03-13 DIAGNOSIS — I482 Chronic atrial fibrillation, unspecified: Secondary | ICD-10-CM

## 2019-03-13 LAB — POCT INR: INR: 3.3 — AB (ref 2.0–3.0)

## 2019-03-13 NOTE — Patient Instructions (Signed)
Started warfarin 5mg  daily on 8/24 Decrease warfarin to 1 tablet daily except 1/2 tablet on Mondays and Thursdays Recheck in 1 week

## 2019-03-23 ENCOUNTER — Ambulatory Visit (INDEPENDENT_AMBULATORY_CARE_PROVIDER_SITE_OTHER): Payer: Medicare Other | Admitting: *Deleted

## 2019-03-23 DIAGNOSIS — I4819 Other persistent atrial fibrillation: Secondary | ICD-10-CM | POA: Diagnosis not present

## 2019-03-23 DIAGNOSIS — Z5181 Encounter for therapeutic drug level monitoring: Secondary | ICD-10-CM

## 2019-03-23 LAB — POCT INR: INR: 5 — AB (ref 2.0–3.0)

## 2019-03-23 NOTE — Patient Instructions (Signed)
Started warfarin 5mg  daily on 8/24 Hold coumadin tonight and tomorrow night then decrease dose to 1/2 tablet daily except 1 tablet on Mondays Recheck in 10 days

## 2019-04-03 ENCOUNTER — Other Ambulatory Visit: Payer: Self-pay

## 2019-04-03 ENCOUNTER — Ambulatory Visit (INDEPENDENT_AMBULATORY_CARE_PROVIDER_SITE_OTHER): Payer: Medicare Other | Admitting: *Deleted

## 2019-04-03 DIAGNOSIS — Z5181 Encounter for therapeutic drug level monitoring: Secondary | ICD-10-CM | POA: Diagnosis not present

## 2019-04-03 DIAGNOSIS — I4819 Other persistent atrial fibrillation: Secondary | ICD-10-CM | POA: Diagnosis not present

## 2019-04-03 LAB — POCT INR: INR: 1.9 — AB (ref 2.0–3.0)

## 2019-04-03 NOTE — Patient Instructions (Signed)
Started warfarin 5mg  daily on 8/24 Increase coumadin to 1/2 tablet daily except 1 tablet on Mondays and Thursdays Recheck in 2 wks

## 2019-04-17 ENCOUNTER — Other Ambulatory Visit: Payer: Self-pay

## 2019-04-17 ENCOUNTER — Ambulatory Visit (INDEPENDENT_AMBULATORY_CARE_PROVIDER_SITE_OTHER): Payer: Medicare Other | Admitting: *Deleted

## 2019-04-17 DIAGNOSIS — I4819 Other persistent atrial fibrillation: Secondary | ICD-10-CM

## 2019-04-17 DIAGNOSIS — Z5181 Encounter for therapeutic drug level monitoring: Secondary | ICD-10-CM | POA: Diagnosis not present

## 2019-04-17 DIAGNOSIS — Z23 Encounter for immunization: Secondary | ICD-10-CM | POA: Diagnosis not present

## 2019-04-17 LAB — POCT INR: INR: 2.8 (ref 2.0–3.0)

## 2019-04-17 NOTE — Patient Instructions (Signed)
Continue warfarin 1/2 tablet daily except 1 tablet on Mondays and Thursdays Recheck in 3 wks 

## 2019-04-20 ENCOUNTER — Encounter (HOSPITAL_COMMUNITY): Payer: Self-pay | Admitting: Physician Assistant

## 2019-04-20 ENCOUNTER — Other Ambulatory Visit: Payer: Self-pay

## 2019-04-20 ENCOUNTER — Ambulatory Visit (HOSPITAL_COMMUNITY)
Admission: RE | Admit: 2019-04-20 | Discharge: 2019-04-20 | Disposition: A | Discharge status: Home / Self Care | Payer: Medicare Other | Source: Ambulatory Visit | Attending: Physician Assistant | Admitting: Physician Assistant

## 2019-04-20 VITALS — BP 136/88 | HR 87 | Ht 63.0 in | Wt 169.2 lb

## 2019-04-20 DIAGNOSIS — I4510 Unspecified right bundle-branch block: Secondary | ICD-10-CM | POA: Insufficient documentation

## 2019-04-20 DIAGNOSIS — Z7901 Long term (current) use of anticoagulants: Secondary | ICD-10-CM | POA: Insufficient documentation

## 2019-04-20 DIAGNOSIS — Z8249 Family history of ischemic heart disease and other diseases of the circulatory system: Secondary | ICD-10-CM | POA: Insufficient documentation

## 2019-04-20 DIAGNOSIS — Z87891 Personal history of nicotine dependence: Secondary | ICD-10-CM | POA: Diagnosis not present

## 2019-04-20 DIAGNOSIS — I701 Atherosclerosis of renal artery: Secondary | ICD-10-CM | POA: Diagnosis not present

## 2019-04-20 DIAGNOSIS — F419 Anxiety disorder, unspecified: Secondary | ICD-10-CM | POA: Insufficient documentation

## 2019-04-20 DIAGNOSIS — I4821 Permanent atrial fibrillation: Secondary | ICD-10-CM | POA: Diagnosis not present

## 2019-04-20 DIAGNOSIS — E785 Hyperlipidemia, unspecified: Secondary | ICD-10-CM | POA: Diagnosis not present

## 2019-04-20 DIAGNOSIS — I10 Essential (primary) hypertension: Secondary | ICD-10-CM | POA: Diagnosis not present

## 2019-04-20 DIAGNOSIS — Z79899 Other long term (current) drug therapy: Secondary | ICD-10-CM | POA: Diagnosis not present

## 2019-04-20 NOTE — Progress Notes (Signed)
Primary Care Physician: Asencion Noble, MD Primary Cardiologist: Dr Gwenlyn Found Referring Physician: Dr Toy Care is a 85 y.o. female with a history of renal artery stenosis, HTN, HLD, and persistent atrial fibrillation who presents for consultation in the [facility].  The patient was initially diagnosed with atrial fibrillation recently after presenting to her PCP with elevated heart rates. ECG showed atrial fibrillation and her propranolol was changed to atenolol. Since that time, she noted more SOB and fatigue. She was switched back to propranolol and her symptoms resolved. Her Of note, she has refused anticoagulation in the past 2/2 prior nosebleeds. No triggering factors that she can identify. No snoring or significant alcohol use.  On follow up today, patient reports she has done well since her last visit. She denies any heart racing or palpitations. She is now on coumadin and followed in the [facility]. She denies bleeding issues. She remains active by walking daily and she does feel that she is not able to walk as far as she used to.  Today, she denies symptoms of palpitations, chest pain, shortness of breath, orthopnea, PND, lower extremity edema, dizziness, presyncope, syncope, snoring, daytime somnolence, bleeding, or neurologic sequela. The patient is tolerating medications without difficulties and is otherwise without complaint today.    Atrial Fibrillation Risk Factors:  she does not have symptoms or diagnosis of sleep apnea. she does not have a history of rheumatic fever. she does not have a history of alcohol use. The patient does have a history of early familial atrial fibrillation or other arrhythmias. Daughter also has afib.  she has a BMI of Body mass index is 29.97 kg/m.Marland Kitchen Filed Weights   04/20/19 1107  Weight: 76.7 kg    Family History  Problem Relation Age of Onset  . Hypertension Mother   . Hypertension Sister   . Diabetes Sister       Atrial Fibrillation Management history:  Previous antiarrhythmic drugs: none Previous cardioversions: none Previous ablations: none CHADS2VASC score: 50 (age, female, HTN, PVD) Anticoagulation history: Eliquis   Past Medical History:  Diagnosis Date  . Anxiety   . Atrial fibrillation ([place])   . Cervical disc disease   . Hyperlipidemia   . Hypertension   . Nosebleed   . RBBB (right bundle branch block)   . Renal artery stenosis [facility])    Past Surgical History:  Procedure Laterality Date  . Cardiolite  12/205   negative  . CAROTID STENT  07-06-04  . CATARACT EXTRACTION    . DOPPLER ECHOCARDIOGRAPHY     normal 2D. borderline concentric LVH  . kidney stent    . renal doppler  08/04/2006   patent right renal artery stent  . TONSILLECTOMY      Current Outpatient Medications  Medication Sig Dispense Refill  . diazepam (VALIUM) 5 MG tablet Take 5 mg by mouth daily as needed for anxiety.    Marland Kitchen diltiazem (CARDIZEM CD) 120 MG 24 hr capsule Take 1 capsule (120 mg total) by mouth daily. 90 capsule 3  . doxazosin (CARDURA) 2 MG tablet Take 1 mg by mouth daily.     . hydrochlorothiazide (HYDRODIURIL) 25 MG tablet Take 25 mg by mouth daily.     . Omega-3 Fatty Acids (FISH OIL) 1000 MG CAPS Take 1 capsule by mouth daily.    . propranolol (INDERAL) 80 MG tablet Take 160 mg by mouth daily.    Marland Kitchen warfarin (COUMADIN) 5 MG tablet Take 1 tablet (  5 mg total) by mouth daily. (Patient taking differently: Take 5 mg by mouth daily. Monday and Thursday 5mg  and all other days 2.5mg ) 45 tablet 1   No current facility-administered medications for this encounter.     Allergies  Allergen Reactions  . Aspirin Other (See Comments)    Nose bleeds  . Penicillins Other (See Comments)    Skins comes off tongue    Social History   Socioeconomic History  . Marital status: Married    Spouse name: Not on file  . Number of children: Not on file  . Years of education: Not on file  . Highest education  level: Not on file  Occupational History  . Not on file  Social Needs  . Financial resource strain: Not on file  . Food insecurity    Worry: Not on file    Inability: Not on file  . Transportation needs    Medical: Not on file    Non-medical: Not on file  Tobacco Use  . Smoking status: Former Smoker    Types: Cigarettes    Quit date: 06/14/1985    Years since quitting: 33.8  . Smokeless tobacco: Never Used  Substance and Sexual Activity  . Alcohol use: No  . Drug use: No  . Sexual activity: Not on file  Lifestyle  . Physical activity    Days per week: Not on file    Minutes per session: Not on file  . Stress: Not on file  Relationships  . Social Herbalist on phone: Not on file    Gets together: Not on file    Attends religious service: Not on file    Active member of club or organization: Not on file    Attends meetings of clubs or organizations: Not on file    Relationship status: Not on file  . Intimate partner violence    Fear of current or ex partner: Not on file    Emotionally abused: Not on file    Physically abused: Not on file    Forced sexual activity: Not on file  Other Topics Concern  . Not on file  Social History Narrative  . Not on file     ROS- All systems are reviewed and negative except as per the HPI above.  Physical Exam: Vitals:   04/20/19 1107  BP: 136/88  Pulse: 87  Weight: 76.7 kg  Height: 5\' 3"  (1.6 m)    GEN- The patient is well appearing elderly female, alert and oriented x 3 today.   HEENT-head normocephalic, atraumatic, sclera clear, conjunctiva pink, hearing intact, trachea midline. Lungs- Clear to ausculation bilaterally, normal work of breathing Heart- irregular rate and rhythm, no murmurs, rubs or gallops  GI- soft, NT, ND, + BS Extremities- no clubbing, cyanosis, or edema MS- no significant deformity or atrophy Skin- no rash or lesion Psych- euthymic mood, full affect Neuro- strength and sensation are intact    Wt Readings from Last 3 Encounters:  04/20/19 76.7 kg  01/18/19 75.8 kg  09/13/18 74.4 kg    EKG today demonstrates afib HR 87, RBBB, QRS 140, QTc 486  Echo 08/12/18 demonstrated  1. The left ventricle has normal systolic function of 0000000. The cavity size is normal. There is no left ventricular wall thickness. Echo evidence of indeterminate diastolic filling patterns. Elevated left ventricular end-diastolic pressure. The left  ventricular diastology could not be evaluatedsecondary to atrial fibrillation.  2. Severely dilated left atrial size.  3. Moderately  dilated right atrial size.  4. Normal tricuspid valve.  5. Tricuspid regurgitation is mild.  6. The aortic valve tricuspid. There is mild thickening and sclerosis without any evidence of stenosis of the aortic valve.  7. The aortic root is normal is size and structure.  8. The inferior vena cava was dilated in size with >50% respiratory variablity.  9. No atrial level shunt detected by color flow Doppler. LA 45 mm  Lexiscan 09/08/18  There was no ST segment deviation noted during stress, atrial fibrillation.  The study is normal.  This is a low risk study.  No ischemia noted on perfusion imaging.  Epic records are reviewed at length today  Assessment and Plan:  1. Permanent atrial fibrillation We discussed the possibility that her afib could be contributing to her fatigue. We discussed AAD therapy +/- DCCV as options to restore SR. Given advanced age, rate control, and mild symptoms, she continues to feel rate control is the best option for her. Continue warfarin Continue diltiazem 120 mg daily Continue propranolol 160 mg daily   This patients CHA2DS2-VASc Score and unadjusted Ischemic Stroke Rate (% per year) is equal to 7.2 % stroke rate/year from a score of 5  Above score calculated as 1 point each if present [CHF, HTN, DM, Vascular=MI/PAD/Aortic Plaque, Age if 65-74, or Female] Above score calculated as 2 points  each if present [Age > 75, or Stroke/TIA/TE]  2. HTN Stable, no changes today.    Follow up with Dr Gwenlyn Found in 3 months. AF clinic as needed.   [facility] [address] [place], [place] [address] [phone] 04/20/2019 11:40 AM

## 2019-05-08 ENCOUNTER — Ambulatory Visit (INDEPENDENT_AMBULATORY_CARE_PROVIDER_SITE_OTHER): Payer: Medicare Other | Admitting: *Deleted

## 2019-05-08 ENCOUNTER — Other Ambulatory Visit: Payer: Self-pay

## 2019-05-08 DIAGNOSIS — Z5181 Encounter for therapeutic drug level monitoring: Secondary | ICD-10-CM | POA: Diagnosis not present

## 2019-05-08 DIAGNOSIS — I4819 Other persistent atrial fibrillation: Secondary | ICD-10-CM

## 2019-05-08 LAB — POCT INR: INR: 2.6 (ref 2.0–3.0)

## 2019-05-08 NOTE — Patient Instructions (Signed)
Continue warfarin 1/2 tablet daily except 1 tablet on Mondays and Thursdays Recheck in 4 wks 

## 2019-06-05 ENCOUNTER — Ambulatory Visit (INDEPENDENT_AMBULATORY_CARE_PROVIDER_SITE_OTHER): Payer: Medicare Other | Admitting: *Deleted

## 2019-06-05 ENCOUNTER — Other Ambulatory Visit: Payer: Self-pay

## 2019-06-05 DIAGNOSIS — I4819 Other persistent atrial fibrillation: Secondary | ICD-10-CM

## 2019-06-05 DIAGNOSIS — Z5181 Encounter for therapeutic drug level monitoring: Secondary | ICD-10-CM

## 2019-06-05 LAB — POCT INR: INR: 2.4 (ref 2.0–3.0)

## 2019-06-05 NOTE — Patient Instructions (Signed)
Continue warfarin 1/2 tablet daily except 1 tablet on Mondays and Thursdays Recheck in 4 wks 

## 2019-07-03 ENCOUNTER — Other Ambulatory Visit: Payer: Self-pay

## 2019-07-03 ENCOUNTER — Ambulatory Visit (INDEPENDENT_AMBULATORY_CARE_PROVIDER_SITE_OTHER): Payer: Medicare Other | Admitting: *Deleted

## 2019-07-03 DIAGNOSIS — Z5181 Encounter for therapeutic drug level monitoring: Secondary | ICD-10-CM | POA: Diagnosis not present

## 2019-07-03 DIAGNOSIS — I4819 Other persistent atrial fibrillation: Secondary | ICD-10-CM | POA: Diagnosis not present

## 2019-07-03 LAB — POCT INR: INR: 2 (ref 2.0–3.0)

## 2019-07-03 MED ORDER — WARFARIN SODIUM 5 MG PO TABS: 5.0000 mg | ORAL_TABLET | Freq: Every day | ORAL | 3 refills | Status: DC

## 2019-07-03 NOTE — Patient Instructions (Signed)
Take warfarin 1 1/2 tablets tonight then resume 1/2 tablet daily except 1 tablet on Mondays and Thursdays Recheck in 4 wks

## 2019-07-21 ENCOUNTER — Ambulatory Visit: Payer: Medicare Other | Admitting: Cardiovascular Disease

## 2019-07-31 ENCOUNTER — Encounter: Payer: Medicare Other | Admitting: *Deleted

## 2019-07-31 DIAGNOSIS — Z5181 Encounter for therapeutic drug level monitoring: Secondary | ICD-10-CM

## 2019-07-31 DIAGNOSIS — I4819 Other persistent atrial fibrillation: Secondary | ICD-10-CM

## 2019-08-04 ENCOUNTER — Ambulatory Visit: Payer: Medicare Other | Admitting: Cardiovascular Disease

## 2019-08-04 ENCOUNTER — Other Ambulatory Visit: Payer: Self-pay

## 2019-08-04 ENCOUNTER — Encounter: Payer: Self-pay | Admitting: Cardiovascular Disease

## 2019-08-04 VITALS — BP 150/78 | HR 79 | Temp 97.2°F | Ht 63.0 in | Wt 171.8 lb

## 2019-08-04 DIAGNOSIS — R0600 Dyspnea, unspecified: Secondary | ICD-10-CM | POA: Diagnosis not present

## 2019-08-04 DIAGNOSIS — I701 Atherosclerosis of renal artery: Secondary | ICD-10-CM | POA: Diagnosis not present

## 2019-08-04 DIAGNOSIS — R0989 Other specified symptoms and signs involving the circulatory and respiratory systems: Secondary | ICD-10-CM

## 2019-08-04 DIAGNOSIS — E782 Mixed hyperlipidemia: Secondary | ICD-10-CM

## 2019-08-04 DIAGNOSIS — R0609 Other forms of dyspnea: Secondary | ICD-10-CM

## 2019-08-04 DIAGNOSIS — I10 Essential (primary) hypertension: Secondary | ICD-10-CM | POA: Diagnosis not present

## 2019-08-04 NOTE — Progress Notes (Signed)
08/04/2019 [place]   March 25, 1934  [id number]  Primary Physician Asencion Noble, MD Primary Cardiologist: Lorretta Harp MD FACP, [place], [place], [place]  HPI:  Savannah Kirk is a 86 y.o. mildly overweight, divorced Caucasian female, mother of 3 who I last saw in the office  08/31/2018.Marland Kitchen She has a history of PAD status post right renal artery PTA and stenting by myself July 06, 2004. We have been following this by duplex ultrasound which was last done August 03, 2011, that was stable. Her other problems include hypertension and hyperlipidemia. Not on a statin drug because of statin intolerance. She also has right lower extremity discomfort diagnosed by Dr. Christella Noa and spinal stenosis with radicular pain improved with steroid injection. Since I saw her a year ago she's remained otherwise asymptomatic. Her most recent l renal Doppler study performed 01/03/14 revealed a widely patent right renal artery stent. Dr. Willey Blade follows her lipid profile.   When I saw her a year ago she was in A. fib.  I referred her to the A. fib clinic.  She was originally started on Eliquis which was transitioned to warfarin because of side effects.  She is rate controlled.  She was complaining of dyspnea I last saw her and she had negative Myoview in February of last year and a normal 2D echo in January of last year.   Current Meds  Medication Sig  . diazepam (VALIUM) 5 MG tablet Take 5 mg by mouth daily as needed for anxiety.  Marland Kitchen diltiazem (CARDIZEM CD) 120 MG 24 hr capsule Take 1 capsule (120 mg total) by mouth daily.  Marland Kitchen doxazosin (CARDURA) 2 MG tablet Take 1 mg by mouth daily.   . hydrochlorothiazide (HYDRODIURIL) 25 MG tablet Take 25 mg by mouth daily.   . Omega-3 Fatty Acids (FISH OIL) 1000 MG CAPS Take 1 capsule by mouth daily.  . propranolol (INDERAL) 80 MG tablet Take 160 mg by mouth daily.  Marland Kitchen warfarin (COUMADIN) 5 MG tablet Take 1 tablet (5 mg total) by mouth daily.     Allergies  Allergen Reactions  .  Aspirin Other (See Comments)    Nose bleeds  . Penicillins Other (See Comments)    Skins comes off tongue    Social History   Socioeconomic History  . Marital status: Married    Spouse name: Not on file  . Number of children: Not on file  . Years of education: Not on file  . Highest education level: Not on file  Occupational History  . Not on file  Tobacco Use  . Smoking status: Former Smoker    Types: Cigarettes    Quit date: 06/14/1985    Years since quitting: 34.1  . Smokeless tobacco: Never Used  Substance and Sexual Activity  . Alcohol use: No  . Drug use: No  . Sexual activity: Not on file  Other Topics Concern  . Not on file  Social History Narrative  . Not on file   Social Determinants of Health   Financial Resource Strain:   . Difficulty of Paying Living Expenses: Not on file  Food Insecurity:   . Worried About Charity fundraiser in the Last Year: Not on file  . Ran Out of Food in the Last Year: Not on file  Transportation Needs:   . Lack of Transportation (Medical): Not on file  . Lack of Transportation (Non-Medical): Not on file  Physical Activity:   . Days of Exercise per Week:  Not on file  . Minutes of Exercise per Session: Not on file  Stress:   . Feeling of Stress : Not on file  Social Connections:   . Frequency of Communication with Friends and Family: Not on file  . Frequency of Social Gatherings with Friends and Family: Not on file  . Attends Religious Services: Not on file  . Active Member of Clubs or Organizations: Not on file  . Attends Archivist Meetings: Not on file  . Marital Status: Not on file  Intimate Partner Violence:   . Fear of Current or Ex-Partner: Not on file  . Emotionally Abused: Not on file  . Physically Abused: Not on file  . Sexually Abused: Not on file     Review of Systems: General: negative for chills, fever, night sweats or weight changes.  Cardiovascular: negative for chest pain, dyspnea on exertion,  edema, orthopnea, palpitations, paroxysmal nocturnal dyspnea or shortness of breath Dermatological: negative for rash Respiratory: negative for cough or wheezing Urologic: negative for hematuria Abdominal: negative for nausea, vomiting, diarrhea, bright red blood per rectum, melena, or hematemesis Neurologic: negative for visual changes, syncope, or dizziness All other systems reviewed and are otherwise negative except as noted above.    Blood pressure (!) 150/78, pulse 79, temperature (!) 97.2 F (36.2 C), height 5\' 3"  (1.6 m), weight 171 lb 12.8 oz (77.9 kg), SpO2 96 %.  General appearance: alert and no distress Neck: no adenopathy, no JVD, supple, symmetrical, trachea midline, thyroid not enlarged, symmetric, no tenderness/mass/nodules and High-pitched left carotid bruit Lungs: clear to auscultation bilaterally Heart: irregularly irregular rhythm Extremities: extremities normal, atraumatic, no cyanosis or edema Pulses: 2+ and symmetric Skin: Skin color, texture, turgor normal. No rashes or lesions Neurologic: Alert and oriented X 3, normal strength and tone. Normal symmetric reflexes. Normal coordination and gait  EKG not performed today  ASSESSMENT AND PLAN:   Renal artery stenosis History of renal artery stenting by myself 07/06/2004 followed by duplex ultrasound.  Is been several years since this was done.  We will recheck a renal Doppler study.  Essential hypertension History of essential hypertension blood pressure measured today 150/78.  She is on diltiazem, propranolol and hydrochlorothiazide.  Hyperlipidemia History of hyperlipidemia not on statin therapy with lipid profile performed 08/01/2018 revealing total cholesterol 202, LDL of 120 and HDL of 63.  Atrial fibrillation, chronic History of persistent A. fib rate controlled originally on Eliquis oral anticoagulation switching to warfarin because of side effects, follow-up in [place].  Dyspnea on exertion Dyspnea  on exertion of unclear etiology.  She did have a Myoview performed 09/08/2018 that was nonischemic and low risk as well as a 2D echo performed 08/12/2018 revealing normal LV systolic function without valvular abnormalities.  It is possible that her dyspnea is related to her A. fib.      Lorretta Harp MD FACP,FACC,FAHA, [facility] 08/04/2019 12:26 PM

## 2019-08-04 NOTE — Assessment & Plan Note (Signed)
Dyspnea on exertion of unclear etiology.  She did have a Myoview performed 09/08/2018 that was nonischemic and low risk as well as a 2D echo performed 08/12/2018 revealing normal LV systolic function without valvular abnormalities.  It is possible that her dyspnea is related to her A. fib.

## 2019-08-04 NOTE — Assessment & Plan Note (Signed)
History of hyperlipidemia not on statin therapy with lipid profile performed 08/01/2018 revealing total cholesterol 202, LDL of 120 and HDL of 63.

## 2019-08-04 NOTE — Assessment & Plan Note (Signed)
History of persistent A. fib rate controlled originally on Eliquis oral anticoagulation switching to warfarin because of side effects, follow-up in [place].

## 2019-08-04 NOTE — Assessment & Plan Note (Signed)
History of essential hypertension blood pressure measured today 150/78.  She is on diltiazem, propranolol and hydrochlorothiazide.

## 2019-08-04 NOTE — Assessment & Plan Note (Signed)
History of renal artery stenting by myself 07/06/2004 followed by duplex ultrasound.  Is been several years since this was done.  We will recheck a renal Doppler study.

## 2019-08-04 NOTE — Patient Instructions (Signed)
Medication Instructions:  Your physician recommends that you continue on your current medications as directed. Please refer to the Current Medication list given to you today.  If you need a refill on your cardiac medications before your next appointment, please call your pharmacy.   Lab work: NONE  Testing/Procedures: Your physician has requested that you have a carotid duplex. This test is an ultrasound of the carotid arteries in your neck. It looks at blood flow through these arteries that supply the brain with blood. Allow one hour for this exam. There are no restrictions or special instructions.  AND  Your physician has requested that you have a renal artery duplex. During this test, an ultrasound is used to evaluate blood flow to the kidneys. Allow one hour for this exam. Do not eat after midnight the day before and avoid carbonated beverages. Take your medications as you usually do.  Follow-Up: At [facility], you and your health needs are our priority.  As part of our continuing mission to provide you with exceptional heart care, we have created designated Provider Care Teams.  These Care Teams include your primary Cardiologist (physician) and Advanced Practice Providers (APPs -  Physician Assistants and Nurse Practitioners) who all work together to provide you with the care you need, when you need it. You may see Quay Burow, MD or one of the following Advanced Practice Providers on your designated Care Team:    Kerin Ransom, PA-C  [place], [place]  Coletta Memos, [place]  Your physician wants you to follow-up in: 6 months with a physicians assistant Your physician wants you to follow-up in: 1 year with Dr. Gwenlyn Found

## 2019-08-07 DIAGNOSIS — E785 Hyperlipidemia, unspecified: Secondary | ICD-10-CM | POA: Diagnosis not present

## 2019-08-07 DIAGNOSIS — M1990 Unspecified osteoarthritis, unspecified site: Secondary | ICD-10-CM | POA: Diagnosis not present

## 2019-08-07 DIAGNOSIS — Z79899 Other long term (current) drug therapy: Secondary | ICD-10-CM | POA: Diagnosis not present

## 2019-08-07 DIAGNOSIS — I10 Essential (primary) hypertension: Secondary | ICD-10-CM | POA: Diagnosis not present

## 2019-08-07 DIAGNOSIS — I482 Chronic atrial fibrillation, unspecified: Secondary | ICD-10-CM | POA: Diagnosis not present

## 2019-08-11 ENCOUNTER — Encounter (HOSPITAL_COMMUNITY): Payer: Medicare Other

## 2019-08-16 DIAGNOSIS — I482 Chronic atrial fibrillation, unspecified: Secondary | ICD-10-CM | POA: Diagnosis not present

## 2019-08-16 DIAGNOSIS — I10 Essential (primary) hypertension: Secondary | ICD-10-CM | POA: Diagnosis not present

## 2019-08-16 DIAGNOSIS — I701 Atherosclerosis of renal artery: Secondary | ICD-10-CM | POA: Diagnosis not present

## 2019-08-17 ENCOUNTER — Ambulatory Visit (HOSPITAL_COMMUNITY)
Admission: RE | Admit: 2019-08-17 | Discharge: 2019-08-17 | Disposition: A | Discharge status: Home / Self Care | Payer: Medicare Other | Source: Ambulatory Visit | Attending: Cardiovascular Disease | Admitting: Cardiovascular Disease

## 2019-08-17 ENCOUNTER — Other Ambulatory Visit: Payer: Self-pay

## 2019-08-17 ENCOUNTER — Ambulatory Visit (HOSPITAL_BASED_OUTPATIENT_CLINIC_OR_DEPARTMENT_OTHER)
Admission: RE | Admit: 2019-08-17 | Discharge: 2019-08-17 | Disposition: A | Discharge status: Home / Self Care | Payer: Medicare Other | Source: Ambulatory Visit | Attending: Cardiovascular Disease | Admitting: Cardiovascular Disease

## 2019-08-17 DIAGNOSIS — I701 Atherosclerosis of renal artery: Secondary | ICD-10-CM | POA: Diagnosis not present

## 2019-08-17 DIAGNOSIS — R0989 Other specified symptoms and signs involving the circulatory and respiratory systems: Secondary | ICD-10-CM | POA: Diagnosis not present

## 2019-08-18 DIAGNOSIS — I701 Atherosclerosis of renal artery: Secondary | ICD-10-CM

## 2019-08-25 ENCOUNTER — Telehealth: Payer: Self-pay | Admitting: Cardiovascular Disease

## 2019-08-25 NOTE — Telephone Encounter (Signed)
Patient would like someone to go over the details in the letter she received from [facility] dated 08-18-19. There were a couple things that did not make sense to her. The biggest concern being that the stent that was placed was no longer visible. Please call to discuss

## 2019-08-29 NOTE — Telephone Encounter (Signed)
Called pt back - phone is not in service.

## 2019-08-30 ENCOUNTER — Encounter: Payer: Self-pay | Admitting: Cardiovascular Disease

## 2019-08-30 ENCOUNTER — Other Ambulatory Visit: Payer: Self-pay

## 2019-08-30 ENCOUNTER — Ambulatory Visit (INDEPENDENT_AMBULATORY_CARE_PROVIDER_SITE_OTHER): Payer: Medicare Other | Admitting: Cardiovascular Disease

## 2019-08-30 DIAGNOSIS — I701 Atherosclerosis of renal artery: Secondary | ICD-10-CM | POA: Diagnosis not present

## 2019-08-30 DIAGNOSIS — I482 Chronic atrial fibrillation, unspecified: Secondary | ICD-10-CM | POA: Diagnosis not present

## 2019-08-30 DIAGNOSIS — I10 Essential (primary) hypertension: Secondary | ICD-10-CM

## 2019-08-30 DIAGNOSIS — E782 Mixed hyperlipidemia: Secondary | ICD-10-CM

## 2019-08-30 DIAGNOSIS — I779 Disorder of arteries and arterioles, unspecified: Secondary | ICD-10-CM | POA: Insufficient documentation

## 2019-08-30 DIAGNOSIS — R0600 Dyspnea, unspecified: Secondary | ICD-10-CM

## 2019-08-30 DIAGNOSIS — R0609 Other forms of dyspnea: Secondary | ICD-10-CM

## 2019-08-30 DIAGNOSIS — I6522 Occlusion and stenosis of left carotid artery: Secondary | ICD-10-CM

## 2019-08-30 NOTE — Assessment & Plan Note (Signed)
History of right renal artery stenting December 2005.  Recent Doppler studies performed 08/17/2019 showed no evidence of restenosis.  We will follow this up in 1 year

## 2019-08-30 NOTE — Assessment & Plan Note (Signed)
History of dyspnea on exertion which I suspect is related to her A. fib.  She did have an echocardiogram in Myoview stress test performed a year ago which were normal.

## 2019-08-30 NOTE — Progress Notes (Signed)
08/30/2019 [place]   Jul 30, 1933  [id number]  Primary Physician Asencion Noble, MD Primary Cardiologist: Lorretta Harp MD FACP, [place], [place], [place]  HPI:  Savannah Kirk is a 86 y.o.  mildly overweight, divorced Caucasian female, mother of 3 who I last saw in the office  08/04/2019.Marland Kitchen She has a history of PAD status post right renal artery PTA and stenting by myself July 06, 2004. We have been following this by duplex ultrasound which was last done August 03, 2011, that was stable. Her other problems include hypertension and hyperlipidemia. Not on a statin drug because of statin intolerance. She also has right lower extremity discomfort diagnosed by Dr. Christella Noa and spinal stenosis with radicular pain improved with steroid injection. Since I saw her a year ago she's remained otherwise asymptomatic. Her most recent l renal Doppler study performed 01/03/14 revealed a widely patent right renal artery stent. Dr. Willey Blade follows her lipid profile.   When I saw her a year ago she was in A. fib.  I referred her to the A. fib clinic.  She was originally started on Eliquis which was transitioned to warfarin because of side effects.  She is rate controlled.  She was complaining of dyspnea I last saw her and she had negative Myoview in February of last year and a normal 2D echo in January of last year.  Because of an auscultated bruit I performed carotid Doppler study on her 08/17/2019 revealing a high-grade left ICA stenosis.  Based on this I am referring her to Dr. Trula Slade for consideration of elective left carotid endarterectomy.   Current Meds  Medication Sig  . diazepam (VALIUM) 5 MG tablet Take 5 mg by mouth daily as needed for anxiety.  Marland Kitchen diltiazem (CARDIZEM CD) 120 MG 24 hr capsule Take 1 capsule (120 mg total) by mouth daily.  Marland Kitchen doxazosin (CARDURA) 2 MG tablet Take 1 mg by mouth daily.   . hydrochlorothiazide (HYDRODIURIL) 25 MG tablet Take 25 mg by mouth daily.   . Omega-3 Fatty Acids (FISH  OIL) 1000 MG CAPS Take 1 capsule by mouth daily.  . propranolol (INDERAL) 80 MG tablet Take 160 mg by mouth daily.  Marland Kitchen warfarin (COUMADIN) 5 MG tablet Take 1 tablet (5 mg total) by mouth daily.     Allergies  Allergen Reactions  . Aspirin Other (See Comments)    Nose bleeds  . Penicillins Other (See Comments)    Skins comes off tongue    Social History   Socioeconomic History  . Marital status: Married    Spouse name: Not on file  . Number of children: Not on file  . Years of education: Not on file  . Highest education level: Not on file  Occupational History  . Not on file  Tobacco Use  . Smoking status: Former Smoker    Types: Cigarettes    Quit date: 06/14/1985    Years since quitting: 34.2  . Smokeless tobacco: Never Used  Substance and Sexual Activity  . Alcohol use: No  . Drug use: No  . Sexual activity: Not on file  Other Topics Concern  . Not on file  Social History Narrative  . Not on file   Social Determinants of Health   Financial Resource Strain:   . Difficulty of Paying Living Expenses: Not on file  Food Insecurity:   . Worried About Charity fundraiser in the Last Year: Not on file  . Ran Out of Food in  the Last Year: Not on file  Transportation Needs:   . Lack of Transportation (Medical): Not on file  . Lack of Transportation (Non-Medical): Not on file  Physical Activity:   . Days of Exercise per Week: Not on file  . Minutes of Exercise per Session: Not on file  Stress:   . Feeling of Stress : Not on file  Social Connections:   . Frequency of Communication with Friends and Family: Not on file  . Frequency of Social Gatherings with Friends and Family: Not on file  . Attends Religious Services: Not on file  . Active Member of Clubs or Organizations: Not on file  . Attends Archivist Meetings: Not on file  . Marital Status: Not on file  Intimate Partner Violence:   . Fear of Current or Ex-Partner: Not on file  . Emotionally Abused: Not  on file  . Physically Abused: Not on file  . Sexually Abused: Not on file     Review of Systems: General: negative for chills, fever, night sweats or weight changes.  Cardiovascular: negative for chest pain, dyspnea on exertion, edema, orthopnea, palpitations, paroxysmal nocturnal dyspnea or shortness of breath Dermatological: negative for rash Respiratory: negative for cough or wheezing Urologic: negative for hematuria Abdominal: negative for nausea, vomiting, diarrhea, bright red blood per rectum, melena, or hematemesis Neurologic: negative for visual changes, syncope, or dizziness All other systems reviewed and are otherwise negative except as noted above.    Blood pressure (!) 151/75, pulse 72, height 5\' 4"  (1.626 m), weight 171 lb 6.4 oz (77.7 kg), SpO2 96 %.  General appearance: alert and no distress Neck: no adenopathy, no JVD, supple, symmetrical, trachea midline, thyroid not enlarged, symmetric, no tenderness/mass/nodules and High-pitched left carotid bruit Lungs: clear to auscultation bilaterally Heart: irregularly irregular rhythm Extremities: extremities normal, atraumatic, no cyanosis or edema Pulses: 2+ and symmetric Skin: Skin color, texture, turgor normal. No rashes or lesions Neurologic: Alert and oriented X 3, normal strength and tone. Normal symmetric reflexes. Normal coordination and gait  EKG not performed today  ASSESSMENT AND PLAN:   Renal artery stenosis History of right renal artery stenting December 2005.  Recent Doppler studies performed 08/17/2019 showed no evidence of restenosis.  We will follow this up in 1 year  Essential hypertension History of essential hypertension with blood pressure measured today at 151/75.  She is on diltiazem, hydrochlorothiazide and propranolol.  Hyperlipidemia History of hyperlipidemia intolerant to statin therapy with lipid profile performed 08/07/2019 revealing total cholesterol 222, LDL of 120 and HDL of 110.  We will  explore PCSK9.  Atrial fibrillation, chronic History of atrial fibrillation rate controlled on Coumadin anticoagulation.  Dyspnea on exertion History of dyspnea on exertion which I suspect is related to her A. fib.  She did have an echocardiogram in Myoview stress test performed a year ago which were normal.  Carotid artery disease ([place]) Recent carotid Doppler performed 08/17/2019 revealed high-grade left ICA stenosis.  The patient is neurologically asymptomatic.  I am referring her to Dr. Trula Slade for consideration of left carotid endarterectomy.  She would be at low perioperative risk given every recent normal Myoview and 2D echo 1 year ago.      Lorretta Harp MD FACP,FACC,FAHA, [facility] 08/30/2019 1:49 PM

## 2019-08-30 NOTE — Assessment & Plan Note (Signed)
Recent carotid Doppler performed 08/17/2019 revealed high-grade left ICA stenosis.  The patient is neurologically asymptomatic.  I am referring her to Dr. Trula Slade for consideration of left carotid endarterectomy.  She would be at low perioperative risk given every recent normal Myoview and 2D echo 1 year ago.

## 2019-08-30 NOTE — Assessment & Plan Note (Signed)
History of hyperlipidemia intolerant to statin therapy with lipid profile performed 08/07/2019 revealing total cholesterol 222, LDL of 120 and HDL of 110.  We will explore PCSK9.

## 2019-08-30 NOTE — Assessment & Plan Note (Signed)
History of atrial fibrillation rate controlled on Coumadin anticoagulation. 

## 2019-08-30 NOTE — Assessment & Plan Note (Signed)
History of essential hypertension with blood pressure measured today at 151/75.  She is on diltiazem, hydrochlorothiazide and propranolol.

## 2019-08-30 NOTE — Patient Instructions (Signed)
Medication Instructions:  Your physician recommends that you continue on your current medications as directed. Please refer to the Current Medication list given to you today.  If you need a refill on your cardiac medications before your next appointment, please call your pharmacy.   Lab work: NONE  Testing/Procedures: Your physician has requested that you have a renal artery duplex in one year. During this test, an ultrasound is used to evaluate blood flow to the kidneys. Allow one hour for this exam. Do not eat after midnight the day before and avoid carbonated beverages. Take your medications as you usually do.   Follow-Up: At [facility], you and your health needs are our priority.  As part of our continuing mission to provide you with exceptional heart care, we have created designated Provider Care Teams.  These Care Teams include your primary Cardiologist (physician) and Advanced Practice Providers (APPs -  Physician Assistants and Nurse Practitioners) who all work together to provide you with the care you need, when you need it. You may see Quay Burow, MD or one of the following Advanced Practice Providers on your designated Care Team:    Kerin Ransom, PA-C  [place], [place]  Coletta Memos, [place]  Your physician wants you to follow-up in: 6 months with Dr. Gwenlyn Found   Any Other Special Instructions Will Be Listed Below (If Applicable). You have been referred to Dr. Trula Slade with Vein and Vascular Surgery. They will be in contact.

## 2019-09-01 ENCOUNTER — Telehealth: Payer: Self-pay | Admitting: Pharmacist Clinician (PhC)/ Clinical Pharmacy Specialist

## 2019-09-01 DIAGNOSIS — E782 Mixed hyperlipidemia: Secondary | ICD-10-CM

## 2019-09-01 MED ORDER — ROSUVASTATIN CALCIUM 5 MG PO TABS: ORAL_TABLET | ORAL | 3 refills | Status: DC

## 2019-09-01 NOTE — Telephone Encounter (Signed)
-----   Message from Roland Earl sent at 09/01/2019  9:47 AM EST ----- The patient is concerned over the cost of this medication and does not want to schedule until she she speaks with someone regarding this.  Can someone call her with this information? ----- Message ----- From: Cain Sieve, RN Sent: 08/30/2019   1:52 PM EST To: [facility]  This pt needs to be scheduled with pharmacy to discuss repatha.   Thanks

## 2019-09-01 NOTE — Telephone Encounter (Signed)
Spoke with patient.  She is on limited income and switched from Eliquis to warfarin last summer because of medication prices in the donut hole.  States she hit donut hole by April and had to pay higher prices for her other generic medications for the remainder of the year.  Not interested in doing that again with Repatha, even if it would be free (insurance plus [employer])  Discussed options, patient willing to try rosuvastatin 5 mg twice weekly.  She will repeat lipid labs after 3 months or let us know if she cannot tolerate.

## 2019-09-04 ENCOUNTER — Other Ambulatory Visit: Payer: Self-pay

## 2019-09-04 ENCOUNTER — Ambulatory Visit: Payer: Medicare Other | Admitting: Surgery

## 2019-09-04 ENCOUNTER — Encounter: Payer: Self-pay | Admitting: Surgery

## 2019-09-04 VITALS — BP 139/77 | HR 74 | Temp 97.9°F | Resp 20 | Ht 64.0 in | Wt 172.0 lb

## 2019-09-04 DIAGNOSIS — I6522 Occlusion and stenosis of left carotid artery: Secondary | ICD-10-CM

## 2019-09-04 NOTE — Progress Notes (Signed)
Vascular and Vein Specialist of [facility]  Patient [name] MRN: [id number] DOB: 1934/05/27 Sex: female   REQUESTING PROVIDER:    Dr. Gwenlyn Found    REASON FOR CONSULT:    Carotid stenosis  HISTORY OF PRESENT ILLNESS:   Savannah Kirk is a 86 y.o. female, who is referred for evaluation of asymptomatic carotid stenosis that was detected by a bruit on physical exam.  This led to an ultrasound which showed greater than 80% left carotid stenosis..  She denies amaurosis fugax, numbness or weakness in either extremity and slurred speech.  Patient has a history of peripheral vascular disease having undergone right renal artery stenting in 2005.  She is medically managed for hypertension.  She suffers from hypercholesterolemia but is not taking a statin due to intolerance.  She does have a history of spinal stenosis and some right lower extremity discomfort.  She is on Coumadin for A. fib which is rate controlled.  She is a former smoker.  PAST MEDICAL HISTORY    Past Medical History:  Diagnosis Date  . Anxiety   . Atrial fibrillation ([place])   . Cervical disc disease   . Hyperlipidemia   . Hypertension   . Nosebleed   . RBBB (right bundle branch block)   . Renal artery stenosis ([facility])      FAMILY HISTORY   Family History  Problem Relation Age of Onset  . Hypertension Mother   . Hypertension Sister   . Diabetes Sister     SOCIAL HISTORY:   Social History   Socioeconomic History  . Marital status: Married    Spouse name: Not on file  . Number of children: Not on file  . Years of education: Not on file  . Highest education level: Not on file  Occupational History  . Not on file  Tobacco Use  . Smoking status: Former Smoker    Types: Cigarettes    Quit date: 06/14/1985    Years since quitting: 34.2  . Smokeless tobacco: Never Used  Substance and Sexual Activity  . Alcohol use: No  . Drug use: No  . Sexual activity: Not on file    Other Topics Concern  . Not on file  Social History Narrative  . Not on file   Social Determinants of Health   Financial Resource Strain:   . Difficulty of Paying Living Expenses: Not on file  Food Insecurity:   . Worried About Charity fundraiser in the Last Year: Not on file  . Ran Out of Food in the Last Year: Not on file  Transportation Needs:   . Lack of Transportation (Medical): Not on file  . Lack of Transportation (Non-Medical): Not on file  Physical Activity:   . Days of Exercise per Week: Not on file  . Minutes of Exercise per Session: Not on file  Stress:   . Feeling of Stress : Not on file  Social Connections:   . Frequency of Communication with Friends and Family: Not on file  . Frequency of Social Gatherings with Friends and Family: Not on file  . Attends Religious Services: Not on file  . Active Member of Clubs or Organizations: Not on file  . Attends Archivist Meetings: Not on file  . Marital Status: Not on file  Intimate Partner Violence:   . Fear of Current or Ex-Partner: Not on file  . Emotionally Abused: Not on file  . Physically Abused: Not on file  . Sexually  Abused: Not on file    ALLERGIES:    Allergies  Allergen Reactions  . Aspirin Other (See Comments)    Nose bleeds  . Penicillins Other (See Comments)    Skins comes off tongue    CURRENT MEDICATIONS:    Current Outpatient Medications  Medication Sig Dispense Refill  . diazepam (VALIUM) 5 MG tablet Take 5 mg by mouth daily as needed for anxiety.    Marland Kitchen doxazosin (CARDURA) 2 MG tablet Take 1 mg by mouth daily.     . hydrochlorothiazide (HYDRODIURIL) 25 MG tablet Take 25 mg by mouth daily.     . Omega-3 Fatty Acids (FISH OIL) 1000 MG CAPS Take 1 capsule by mouth daily.    . propranolol (INDERAL) 80 MG tablet Take 160 mg by mouth daily.    . rosuvastatin (CRESTOR) 5 MG tablet Take 1 tablet by mouth twice weekly as tolerated 30 tablet 3  . warfarin (COUMADIN) 5 MG tablet Take 1  tablet (5 mg total) by mouth daily. 90 tablet 3  . diltiazem (CARDIZEM CD) 120 MG 24 hr capsule Take 1 capsule (120 mg total) by mouth daily. 90 capsule 3   No current facility-administered medications for this visit.    REVIEW OF SYSTEMS:   [X]  denotes positive finding, [ ]  denotes negative finding Cardiac  Comments:  Chest pain or chest pressure: x   Shortness of breath upon exertion: x   Short of breath when lying flat:    Irregular heart rhythm: x       Vascular    Pain in calf, thigh, or hip brought on by ambulation:    Pain in feet at night that wakes you up from your sleep:     Blood clot in your veins:    Leg swelling:         Pulmonary    Oxygen at home:    Productive cough:     Wheezing:         Neurologic    Sudden weakness in arms or legs:     Sudden numbness in arms or legs:     Sudden onset of difficulty speaking or slurred speech:    Temporary loss of vision in one eye:     Problems with dizziness:         Gastrointestinal    Blood in stool:      Vomited blood:         Genitourinary    Burning when urinating:     Blood in urine:        Psychiatric    Major depression:         Hematologic    Bleeding problems:    Problems with blood clotting too easily:        Skin    Rashes or ulcers:        Constitutional    Fever or chills:     PHYSICAL EXAM:   Vitals:   09/04/19 1450  BP: 139/77  Pulse: 74  Resp: 20  Temp: 97.9 F (36.6 C)  SpO2: 95%  Weight: 172 lb (78 kg)  Height: 5' 4"  (1.626 m)    GENERAL: The patient is a well-nourished female, in no acute distress. The vital signs are documented above. CARDIAC: There is a regular rate and rhythm.  VASCULAR: Left carotid bruit PULMONARY: Nonlabored respirations MUSCULOSKELETAL: There are no major deformities or cyanosis. NEUROLOGIC: No focal weakness or paresthesias are detected. SKIN: There are no ulcers or rashes  noted. PSYCHIATRIC: The patient has a normal affect.  STUDIES:   I  have reviewed the following carotid duplex: Right Carotid: Velocities in the right ICA are consistent with a 1-39%  stenosis.   Left Carotid: Velocities in the left ICA are consistent with a 80-99%  stenosis.   Vertebrals: Bilateral vertebral arteries demonstrate antegrade flow.  Subclavians: Normal flow hemodynamics were seen in bilateral subclavian        arteries.  Bifurcation is at the hyoid.  Stenosis is at the bifurcation  ASSESSMENT and PLAN   Asymptomatic left carotid stenosis: We discussed the possibility of stenting versus endarterectomy.  I think she is a better candidate for endarterectomy.  I discussed the risk and benefits including the risk of nerve injury, stroke, bleeding, and cardiopulmonary complications.  All of her questions were answered.  She is on Coumadin for atrial fibrillation which will need to be discontinued.  We have scheduled her surgery for Friday, February 26.  She does have a statin allergy, however she says that she recently met with her pharmacist and she is getting a prescription called in for twice weekly generic Crestor.  If she has not gotten this while she is in the hospital, we will follow-up with pharmacy to make sure that she has the appropriate medication.   Leia Alf, MD, FACS [facility] and Vein Specialists of [facility] [phone] Pager [phone]

## 2019-09-06 ENCOUNTER — Other Ambulatory Visit (HOSPITAL_COMMUNITY)
Admission: RE | Admit: 2019-09-06 | Discharge: 2019-09-06 | Disposition: A | Discharge status: Home / Self Care | Payer: Medicare Other | Source: Ambulatory Visit | Attending: Surgery | Admitting: Surgery

## 2019-09-06 ENCOUNTER — Other Ambulatory Visit: Payer: Self-pay

## 2019-09-06 DIAGNOSIS — Z20822 Contact with and (suspected) exposure to covid-19: Secondary | ICD-10-CM | POA: Diagnosis not present

## 2019-09-06 DIAGNOSIS — Z01812 Encounter for preprocedural laboratory examination: Secondary | ICD-10-CM | POA: Diagnosis not present

## 2019-09-06 LAB — SARS CORONAVIRUS 2 (TAT 6-24 HRS): SARS Coronavirus 2: NEGATIVE

## 2019-09-06 NOTE — Pre-Procedure Instructions (Signed)
Savannah Kirk, [place] Kirk [address] Phone: [phone] Fax: [phone]     Your procedure is scheduled on Friday February 26th.  Report to [facility] Main Entrance "A" at 8:15 A.M., and check in at the Admitting office.  Call this number if you have problems the morning of surgery:  [phone]  Call [phone] if you have any questions prior to your surgery date Monday-Friday 8am-4pm    Remember:  Do not eat or drink after midnight the night before your surgery   Take these medicines the morning of surgery with A SIP OF WATER  diltiazem (CARDIZEM CD)  doxazosin (CARDURA)  propranolol (INDERAL)  Follow your surgeon's instructions on when to stop warfarin (COUMADIN).  If no instructions were given by your surgeon then you will need to call the office to get those instructions.    As of today, STOP taking any Aspirin (unless otherwise instructed by your surgeon), Aleve, Naproxen, Ibuprofen, Motrin, Advil, Goody's, BC's, all herbal medications, fish oil, and all vitamins.    The Morning of Surgery  Do not wear jewelry, make-up or nail polish.  Do not wear lotions, powders, or perfumes/colognes, or deodorant  Do not shave 48 hours prior to surgery.  Men may shave face and neck.  Do not bring valuables to the hospital.  [facility] is not responsible for any belongings or valuables.  If you are a smoker, DO NOT Smoke 24 hours prior to surgery  If you wear a CPAP at night please bring your mask the morning of surgery   Remember that you must have someone to transport you home after your surgery, and remain with you for 24 hours if you are discharged the same day.   Please bring cases for contacts, glasses, hearing aids, dentures or bridgework because it cannot be worn into surgery.    Leave your suitcase in the car.  After surgery it may be brought to your room.  For patients admitted to the hospital, discharge  time will be determined by your treatment team.  Patients discharged the day of surgery will not be allowed to drive home.    Special instructions:   - Preparing For Surgery  Before surgery, you can play an important role. Because skin is not sterile, your skin needs to be as free of germs as possible. You can reduce the number of germs on your skin by washing with CHG (chlorahexidine gluconate) Soap before surgery.  CHG is an antiseptic cleaner which kills germs and bonds with the skin to continue killing germs even after washing.    Oral Hygiene is also important to reduce your risk of infection.  Remember - BRUSH YOUR TEETH THE MORNING OF SURGERY WITH YOUR REGULAR TOOTHPASTE  Please do not use if you have an allergy to CHG or antibacterial soaps. If your skin becomes reddened/irritated stop using the CHG.  Do not shave (including legs and underarms) for at least 48 hours prior to first CHG shower. It is OK to shave your face.  Please follow these instructions carefully.   1. Shower the NIGHT BEFORE SURGERY and the MORNING OF SURGERY with CHG Soap.   2. If you chose to wash your hair, wash your hair first as usual with your normal shampoo.  3. After you shampoo, rinse your hair and body thoroughly to remove the shampoo.  4. Use CHG as you would any other liquid soap. You can apply  CHG directly to the skin and wash gently with a scrungie or a clean washcloth.   5. Apply the CHG Soap to your body ONLY FROM THE NECK DOWN.  Do not use on open wounds or open sores. Avoid contact with your eyes, ears, mouth and genitals (private parts). Wash Face and genitals (private parts)  with your normal soap.   6. Wash thoroughly, paying special attention to the area where your surgery will be performed.  7. Thoroughly rinse your body with warm water from the neck down.  8. DO NOT shower/wash with your normal soap after using and rinsing off the CHG Soap.  9. Pat yourself dry with a  CLEAN TOWEL.  10. Wear CLEAN PAJAMAS to bed the night before surgery, wear comfortable clothes the morning of surgery  11. Place CLEAN SHEETS on your bed the night of your first shower and DO NOT SLEEP WITH PETS.    Day of Surgery:  Please shower the morning of surgery with the CHG soap Do not apply any deodorants/lotions. Please wear clean clothes to the hospital/surgery center.   Remember to brush your teeth WITH YOUR REGULAR TOOTHPASTE.   Please read over the following fact sheets that you were given.

## 2019-09-07 ENCOUNTER — Encounter (HOSPITAL_COMMUNITY)
Admission: RE | Admit: 2019-09-07 | Discharge: 2019-09-07 | Disposition: A | Discharge status: Home / Self Care | Payer: Medicare Other | Source: Ambulatory Visit | Attending: Surgery | Admitting: Surgery

## 2019-09-07 ENCOUNTER — Encounter (HOSPITAL_COMMUNITY): Payer: Self-pay

## 2019-09-07 ENCOUNTER — Encounter (HOSPITAL_COMMUNITY): Payer: Self-pay | Admitting: Vascular Surgery

## 2019-09-07 ENCOUNTER — Other Ambulatory Visit: Payer: Self-pay

## 2019-09-07 DIAGNOSIS — Z01812 Encounter for preprocedural laboratory examination: Secondary | ICD-10-CM | POA: Diagnosis not present

## 2019-09-07 DIAGNOSIS — Z79899 Other long term (current) drug therapy: Secondary | ICD-10-CM | POA: Diagnosis not present

## 2019-09-07 DIAGNOSIS — Z7901 Long term (current) use of anticoagulants: Secondary | ICD-10-CM | POA: Insufficient documentation

## 2019-09-07 DIAGNOSIS — F419 Anxiety disorder, unspecified: Secondary | ICD-10-CM | POA: Insufficient documentation

## 2019-09-07 DIAGNOSIS — I4891 Unspecified atrial fibrillation: Secondary | ICD-10-CM | POA: Insufficient documentation

## 2019-09-07 DIAGNOSIS — E785 Hyperlipidemia, unspecified: Secondary | ICD-10-CM | POA: Diagnosis not present

## 2019-09-07 DIAGNOSIS — I6522 Occlusion and stenosis of left carotid artery: Secondary | ICD-10-CM | POA: Diagnosis not present

## 2019-09-07 DIAGNOSIS — Z87891 Personal history of nicotine dependence: Secondary | ICD-10-CM | POA: Insufficient documentation

## 2019-09-07 DIAGNOSIS — Z01818 Encounter for other preprocedural examination: Secondary | ICD-10-CM | POA: Insufficient documentation

## 2019-09-07 DIAGNOSIS — I10 Essential (primary) hypertension: Secondary | ICD-10-CM | POA: Insufficient documentation

## 2019-09-07 DIAGNOSIS — R143 Flatulence: Secondary | ICD-10-CM | POA: Diagnosis not present

## 2019-09-07 HISTORY — DX: Unspecified osteoarthritis, unspecified site: M19.90

## 2019-09-07 HISTORY — DX: Dyspnea, unspecified: R06.00

## 2019-09-07 HISTORY — DX: Personal history of urinary calculi: Z87.442

## 2019-09-07 LAB — CBC
HCT: 39.7 % (ref 36.0–46.0)
Hemoglobin: 12.8 g/dL (ref 12.0–15.0)
MCH: 30 pg (ref 26.0–34.0)
MCHC: 32.2 g/dL (ref 30.0–36.0)
MCV: 93 fL (ref 80.0–100.0)
Platelets: 232 10*3/uL (ref 150–400)
RBC: 4.27 MIL/uL (ref 3.87–5.11)
RDW: 13.9 % (ref 11.5–15.5)
WBC: 6.7 10*3/uL (ref 4.0–10.5)
nRBC: 0 % (ref 0.0–0.2)

## 2019-09-07 LAB — SURGICAL PCR SCREEN
MRSA, PCR: NEGATIVE
Staphylococcus aureus: NEGATIVE

## 2019-09-07 LAB — COMPREHENSIVE METABOLIC PANEL
ALT: 16 U/L (ref 0–44)
AST: 18 U/L (ref 15–41)
Albumin: 3.8 g/dL (ref 3.5–5.0)
Alkaline Phosphatase: 83 U/L (ref 38–126)
Anion gap: 11 (ref 5–15)
BUN: 14 mg/dL (ref 8–23)
CO2: 29 mmol/L (ref 22–32)
Calcium: 9.4 mg/dL (ref 8.9–10.3)
Chloride: 100 mmol/L (ref 98–111)
Creatinine, Ser: 0.95 mg/dL (ref 0.44–1.00)
GFR calc Af Amer: >60 mL/min (ref >60)
GFR calc non Af Amer: 55 mL/min — ABNORMAL LOW (ref >60)
Glucose, Bld: 121 mg/dL — ABNORMAL HIGH (ref 70–99)
Potassium: 3.3 mmol/L — ABNORMAL LOW (ref 3.5–5.1)
Sodium: 140 mmol/L (ref 135–145)
Total Bilirubin: 1.1 mg/dL (ref 0.3–1.2)
Total Protein: 6.7 g/dL (ref 6.5–8.1)

## 2019-09-07 LAB — URINALYSIS, ROUTINE W REFLEX MICROSCOPIC
Bilirubin Urine: NEGATIVE
Glucose, UA: NEGATIVE mg/dL
Hgb urine dipstick: NEGATIVE
Ketones, ur: NEGATIVE mg/dL
Nitrite: NEGATIVE
Protein, ur: NEGATIVE mg/dL
Specific Gravity, Urine: 1.013 (ref 1.005–1.030)
pH: 6 (ref 5.0–8.0)

## 2019-09-07 LAB — PROTIME-INR
INR: 1.3 — ABNORMAL HIGH (ref 0.8–1.2)
Prothrombin Time: 15.9 seconds — ABNORMAL HIGH (ref 11.4–15.2)

## 2019-09-07 LAB — APTT: aPTT: 31 seconds (ref 24–36)

## 2019-09-07 NOTE — Progress Notes (Addendum)
Abnormal U/A results called in to Dr. Stephens Shire office.

## 2019-09-07 NOTE — Progress Notes (Addendum)
PCP - Willey Blade in [place] Cardiologist - [place]    Chest x-ray - na EKG - 09/07/19 Stress Test - 2/20 ECHO - 1/20 Cardiac Cath - na  Sleep Study - na CPAP -     Blood Thinner Instructions:coumadin last dose 09/03/19 Aspirin Instructions:   COVID TEST- 09/06/19   Anesthesia review: Pt. States Monday 09/04/19 she has been getting "a knot in her chest at night "when watching TV or reading. No pain but, tightness lasing 5-10 mins. And then it goes away. States she has not notified Dr. Gwenlyn Found.  Patient denies shortness of breath, fever, cough and chest pain at PAT appointment   All instructions explained to the patient, with a verbal understanding of the material. Patient agrees to go over the instructions while at home for a better understanding. Patient also instructed to self quarantine after being tested for COVID-19. The opportunity to ask questions was provided.

## 2019-09-07 NOTE — Progress Notes (Signed)
Anesthesia PAT Evaluation:  Case: [id number] Date/Time: 09/08/19 0957   Procedure: ENDARTERECTOMY CAROTID (Left )   Anesthesia type: General   Pre-op diagnosis: LEFT CAROTID STENOSIS   Location: [facility] OR ROOM 16 / [facility]   Surgeons: Serafina Mitchell, MD      DISCUSSION: Patient is an 86 year old female scheduled for the above procedure.  History includes former smoker (quit 1986), afib (diagnosed ~ 07/2018), right BBB, exertional dyspnea, HTN, HLD, carotid artery disease, renal artery stenosis (s/p right renal artery stent 07/03/04), epistaxis, cervical disc disease, back surgery.   Last seen by cardiologist Dr. Gwenlyn Found on 08/30/19. She did report DOE which he felt was related to her afib. Unremarkable stress and echo in 2020. He referred her to [facility] due to finding of severe LICA stenosis.  I evaluated patient during her PAT visit because she reported new chest symptoms that first started on Monday 09/04/19. She noted an occasional "knot" sensation in her mid-to-left chest that she described not as much like pain but more like a "tightness" or "squeezing" that would last for about 5-10 minutes. She's had recurrent episodes over the last several days. They resolve on their own. No symptoms while at PAT. Symptoms seems to occur more in the evening, but are not associated with position (ie lying down), and she did not associate it with activity. She denied chest wall tenderness. She denied any associated symptoms. No orthopnea, palpitations, syncope, edema. She describes a rather significant change in her activity tolerance around Spring of 2020. She had previously been able to walk 3 miles on a regular basis and by May of 2020, she had to stop because her body just "wore out". She has also noticed dyspnea on exertion over the past year.  Change in endurance and DOE had been attributed to her new onset afib. Her 2020 workup did include a stress and echo as mentioned. She did not get the "knot" sensation when walking  from the parking deck to the hospital entrance, but did have dyspnea. She is not a diabetic and denied history of cardiac intervention. She did have a right renal artery stent in 2005 and does have severe LICA stenosis. Denied stroke/TIA or visual changes. She has had intermittent right sided sharp headaches that she first noticed about six months ago. They have been sharper more recently but seems to get some relief with Tylenol. There is no associated visial changes. Reportedly, she discussed this with Dr. Trula Slade, and headaches not felt to be related to carotid disease and was advised to follow up with her PCP.   As above, she has had chest symptoms that started this week. There are some typical and atypical features. She does have CAD risk factors. Her EKG appears stable. I first discussed with anesthesiologist Roberts Gaudy, MD who agreed with need for additional preoperative cardiology input. I called and spoke with on-call cardiologist Candee Furbish, MD. Given new onset symptoms which could represent angina, he would recommend postponing surgery until she can be re-evaluated by Dr. Gwenlyn Found. I notified Dr. Trula Slade who spoke with Dr. Gwenlyn Found, and decision made to postpone 09/08/19 carotid surgery and follow-up with Dr. Gwenlyn Found on 09/08/19 at 1:30 PM. He will determine if ischemic work-up such as stress or cath is warranted. Patient is aware. In the interim we discussed symptoms that may warrant immediate medical attention/EMS.   Last warfarin 09/03/19. 09/06/19 COVID-19 test negative.    [facility]: BP (!) 130/55   Pulse 69   Temp 36.5  C   Resp 18   Ht 5\' 4"  (1.626 m)   Wt 77.9 kg   SpO2 98%   BMI 29.49 kg/m  Patient A&O. No acute distress. Heart irregularly irregular. No murmur noted. Left carotid bruit present. Lungs clear.    PROVIDERS: Asencion Noble, MD is PCP  Quay Burow, MD is cardiologist   LABS: Labs reviewed: Acceptable for surgery.  I notified Dr. Trula Slade of UA results.  (all labs ordered are  listed, but only abnormal results are displayed)  Labs Reviewed  COMPREHENSIVE METABOLIC PANEL - Abnormal; Notable for the following components:      Result Value   Potassium 3.3 (*)    Glucose, Bld 121 (*)    GFR calc non Af Amer 55 (*)    All other components within normal limits  PROTIME-INR - Abnormal; Notable for the following components:   Prothrombin Time 15.9 (*)    INR 1.3 (*)    All other components within normal limits  URINALYSIS, ROUTINE W REFLEX MICROSCOPIC - Abnormal; Notable for the following components:   APPearance HAZY (*)    Leukocytes,Ua LARGE (*)    Bacteria, UA RARE (*)    All other components within normal limits  SURGICAL PCR SCREEN  APTT  CBC     IMAGES: Renal artery [place] 08/17/19: Summary:  Renal:  Right: No evidence of right renal artery stenosis. RRV flow present.     Normal size right kidney. Normal right Resisitive Index.     Normal cortical thickness of right kidney.  Left: No evidence of left renal artery stenosis. LRV flow present.     Normal size of left kidney. Normal left Resistive Index.     Normal cortical thickness of the left kidney.  Mesenteric:  Normal Celiac artery and Superior Mesenteric artery findings. Areas of  limited  visceral study include left kidney size due to bowel gas.    EKG:  EKG 09/07/19: Atrial fibrillation at 77 bpm Right bundle branch block T wave abnormality, consider lateral ischemia Abnormal ECG - I think that overall EKG appears stable when compared to previous tracing.  EKG 04/20/19:  Atrial fibrillation at 87 bpm Right bundle branch block Septal infarct , age undetermined T wave abnormality, consider inferior ischemia No significant change since last tracing Confirmed by Croitoru, Mihai [phone]) on 04/20/2019 6:03:40 PM   CV: Carotid US 08/17/19: Summary:  Right Carotid: Velocities in the right ICA are consistent with a 1-39%  stenosis.  Left Carotid: Velocities in the left ICA are  consistent with a 80-99%  stenosis.  Vertebrals: Bilateral vertebral arteries demonstrate antegrade flow.  Subclavians: Normal flow hemodynamics were seen in bilateral subclavian        arteries.    Nuclear stress test 09/08/18:  There was no ST segment deviation noted during stress, atrial fibrillation.  The study is normal.  This is a low risk study.  No ischemia noted on perfusion imaging.    Echo 08/12/18: IMPRESSIONS  1. The left ventricle has normal systolic function of 0000000. The cavity  size is normal. There is no left ventricular wall thickness. Echo evidence  of indeterminate diastolic filling patterns. Elevated left ventricular  end-diastolic pressure. The left  ventricular diastology could not be evaluatedsecondary to atrial  fibrillation.  2. Severely dilated left atrial size.  3. Moderately dilated right atrial size.  4. Normal tricuspid valve.  5. Tricuspid regurgitation is mild.  6. The aortic valve tricuspid. There is mild thickening and sclerosis  without  any evidence of stenosis of the aortic valve.  7. The aortic root is normal is size and structure.  8. The inferior vena cava was dilated in size with >50% respiratory  variablity.  9. No atrial level shunt detected by color flow Doppler.     Past Medical History:  Diagnosis Date  . Anxiety   . Atrial fibrillation ([place])   . Cervical disc disease   . Hyperlipidemia   . Hypertension   . Nosebleed   . RBBB (right bundle branch block)   . Renal artery stenosis [facility])     Past Surgical History:  Procedure Laterality Date  . Cardiolite  12/205   negative  . CAROTID STENT  07-06-04  . CATARACT EXTRACTION    . DOPPLER ECHOCARDIOGRAPHY     normal 2D. borderline concentric LVH  . kidney stent    . renal doppler  08/04/2006   patent right renal artery stent  . TONSILLECTOMY      MEDICATIONS: . diazepam (VALIUM) 5 MG tablet  . diltiazem (CARDIZEM CD) 120 MG 24 hr capsule  .  doxazosin (CARDURA) 2 MG tablet  . hydrochlorothiazide (HYDRODIURIL) 25 MG tablet  . loperamide (IMODIUM A-D) 2 MG tablet  . Omega-3 Fatty Acids (FISH OIL) 1000 MG CAPS  . propranolol (INDERAL) 80 MG tablet  . rosuvastatin (CRESTOR) 5 MG tablet  . warfarin (COUMADIN) 5 MG tablet   No current facility-administered medications for this encounter.    Myra Gianotti, PA-C Surgical Short Stay/Anesthesiology [facility] Phone [phone] [facility] Phone [phone] 09/07/2019 6:06 PM

## 2019-09-08 ENCOUNTER — Encounter: Payer: Self-pay | Admitting: Cardiovascular Disease

## 2019-09-08 ENCOUNTER — Ambulatory Visit (INDEPENDENT_AMBULATORY_CARE_PROVIDER_SITE_OTHER): Payer: Medicare Other | Admitting: Cardiovascular Disease

## 2019-09-08 ENCOUNTER — Inpatient Hospital Stay (HOSPITAL_COMMUNITY): Admission: RE | Admit: 2019-09-08 | Payer: Medicare Other | Source: Home / Self Care | Admitting: Surgery

## 2019-09-08 ENCOUNTER — Encounter (HOSPITAL_COMMUNITY): Admission: RE | Payer: Self-pay | Source: Home / Self Care

## 2019-09-08 VITALS — BP 138/80 | HR 76 | Ht 64.0 in | Wt 171.6 lb

## 2019-09-08 DIAGNOSIS — Z01818 Encounter for other preprocedural examination: Secondary | ICD-10-CM | POA: Diagnosis not present

## 2019-09-08 SURGERY — ENDARTERECTOMY, CAROTID
Anesthesia: General | Laterality: Left

## 2019-09-08 MED ORDER — WARFARIN SODIUM 5 MG PO TABS: 5.0000 mg | ORAL_TABLET | Freq: Every day | ORAL | 3 refills | Status: DC

## 2019-09-08 NOTE — Progress Notes (Signed)
Savannah Kirk returns today to discuss her atypical chest pain.  She was scheduled for endarterectomy today by Dr. Trula Slade but told the anesthesiologist that she had several episodes of atypical chest pain this week which I suspect were related to anxiety.  She did have a negative Myoview stress test in February of last year.  In any event, I am going to repeat a Lexiscan Myoview stress test on her to preoperatively clear her for her elective endarterectomy.  I told her to go back on her Coumadin for stroke prophylaxis until we have a definitive surgical date.  I will see her back in 3 months.   Lorretta Harp, M.D., [place], [facility], [place] [address] [place], [place]  [address]  [phone] 09/08/2019 1:43 PM

## 2019-09-08 NOTE — Patient Instructions (Addendum)
Medication Instructions:  Restart home dose of Coumadin  If you need a refill on your cardiac medications before your next appointment, please call your pharmacy.   Lab work: NONE.  Testing/Procedures: Your physician has requested that you have a lexiscan myoview next week. For further information please visit [email or web address]. Please follow instruction sheet, as given.   Follow-Up: At [facility], you and your health needs are our priority.  As part of our continuing mission to provide you with exceptional heart care, we have created designated Provider Care Teams.  These Care Teams include your primary Cardiologist (physician) and Advanced Practice Providers (APPs -  Physician Assistants and Nurse Practitioners) who all work together to provide you with the care you need, when you need it. You may see Quay Burow, MD or one of the following Advanced Practice Providers on your designated Care Team:    Kerin Ransom, PA-C  [place], [place]  Coletta Memos, [place]  Your physician wants you to follow-up in: 3 months with Dr. Gwenlyn Found

## 2019-09-12 ENCOUNTER — Ambulatory Visit (HOSPITAL_COMMUNITY)
Admission: RE | Admit: 2019-09-12 | Discharge: 2019-09-12 | Disposition: A | Discharge status: Home / Self Care | Payer: Medicare Other | Source: Ambulatory Visit | Attending: Cardiology | Admitting: Cardiology

## 2019-09-12 ENCOUNTER — Other Ambulatory Visit: Payer: Self-pay

## 2019-09-12 DIAGNOSIS — I4510 Unspecified right bundle-branch block: Secondary | ICD-10-CM | POA: Insufficient documentation

## 2019-09-12 DIAGNOSIS — Z01818 Encounter for other preprocedural examination: Secondary | ICD-10-CM

## 2019-09-12 DIAGNOSIS — Z01810 Encounter for preprocedural cardiovascular examination: Secondary | ICD-10-CM | POA: Insufficient documentation

## 2019-09-12 DIAGNOSIS — I4891 Unspecified atrial fibrillation: Secondary | ICD-10-CM | POA: Insufficient documentation

## 2019-09-12 LAB — MYOCARDIAL PERFUSION IMAGING
LV dias vol: 82 mL (ref 46–106)
LV sys vol: 32 mL
Peak HR: 97 {beats}/min
Rest HR: 75 {beats}/min
SDS: 0
SRS: 0
SSS: 0
TID: 0.99

## 2019-09-12 MED ORDER — TECHNETIUM TC 99M TETROFOSMIN IV KIT
30.7000 | PACK | Freq: Once | INTRAVENOUS | Status: AC | PRN
  Administered 2019-09-12: 30.7 via INTRAVENOUS
  Filled 2019-09-12: qty 31

## 2019-09-12 MED ORDER — TECHNETIUM TC 99M TETROFOSMIN IV KIT
10.2000 | PACK | Freq: Once | INTRAVENOUS | Status: AC | PRN
  Administered 2019-09-12: 10.2 via INTRAVENOUS
  Filled 2019-09-12: qty 11

## 2019-09-12 MED ORDER — REGADENOSON 0.4 MG/5ML IV SOLN
0.4000 mg | Freq: Once | INTRAVENOUS | Status: AC
  Administered 2019-09-12: 0.4 mg via INTRAVENOUS

## 2019-09-18 ENCOUNTER — Telehealth: Payer: Self-pay | Admitting: *Deleted

## 2019-09-18 NOTE — Telephone Encounter (Signed)
Spoke with patient.  States CEA has been postponed to 10/06/19 because she had to have a stress test.  She is still off coumadin.  Told her she needed to go back on coumadin until 5 days before procedure.  She refused.  States she feels so much better off coumadin.  No H/A, less SOB, more energy.  States she will go back on it after surgery.  Explained increased risk of blood clot/stroke.  She verbalized understanding but states she is staying off until after surgery.

## 2019-09-18 NOTE — Telephone Encounter (Signed)
Patient called wanting to verify with Lattie Haw Reid,RN about her upcoming surgery . States that everything has been postponed.  (patient requested to Call after 12 noon )

## 2019-10-04 ENCOUNTER — Other Ambulatory Visit: Payer: Self-pay

## 2019-10-04 ENCOUNTER — Telehealth: Payer: Self-pay

## 2019-10-04 ENCOUNTER — Encounter (HOSPITAL_COMMUNITY)
Admission: RE | Admit: 2019-10-04 | Discharge: 2019-10-04 | Disposition: A | Discharge status: Home / Self Care | Payer: Medicare Other | Source: Ambulatory Visit | Attending: Surgery | Admitting: Surgery

## 2019-10-04 ENCOUNTER — Encounter (HOSPITAL_COMMUNITY): Payer: Self-pay

## 2019-10-04 ENCOUNTER — Other Ambulatory Visit (HOSPITAL_COMMUNITY)
Admission: RE | Admit: 2019-10-04 | Discharge: 2019-10-04 | Disposition: A | Discharge status: Home / Self Care | Payer: Medicare Other | Source: Ambulatory Visit | Attending: Surgery | Admitting: Surgery

## 2019-10-04 DIAGNOSIS — I739 Peripheral vascular disease, unspecified: Secondary | ICD-10-CM | POA: Diagnosis not present

## 2019-10-04 DIAGNOSIS — I10 Essential (primary) hypertension: Secondary | ICD-10-CM | POA: Diagnosis not present

## 2019-10-04 DIAGNOSIS — Z888 Allergy status to other drugs, medicaments and biological substances status: Secondary | ICD-10-CM | POA: Diagnosis not present

## 2019-10-04 DIAGNOSIS — Z87891 Personal history of nicotine dependence: Secondary | ICD-10-CM | POA: Diagnosis not present

## 2019-10-04 DIAGNOSIS — Z20822 Contact with and (suspected) exposure to covid-19: Secondary | ICD-10-CM | POA: Diagnosis not present

## 2019-10-04 DIAGNOSIS — E7800 Pure hypercholesterolemia, unspecified: Secondary | ICD-10-CM | POA: Diagnosis not present

## 2019-10-04 DIAGNOSIS — Z79899 Other long term (current) drug therapy: Secondary | ICD-10-CM | POA: Diagnosis not present

## 2019-10-04 DIAGNOSIS — Z7901 Long term (current) use of anticoagulants: Secondary | ICD-10-CM | POA: Diagnosis not present

## 2019-10-04 DIAGNOSIS — I482 Chronic atrial fibrillation, unspecified: Secondary | ICD-10-CM | POA: Diagnosis not present

## 2019-10-04 DIAGNOSIS — I6522 Occlusion and stenosis of left carotid artery: Secondary | ICD-10-CM | POA: Diagnosis not present

## 2019-10-04 DIAGNOSIS — E785 Hyperlipidemia, unspecified: Secondary | ICD-10-CM | POA: Diagnosis not present

## 2019-10-04 LAB — COMPREHENSIVE METABOLIC PANEL
ALT: 16 U/L (ref 0–44)
AST: 18 U/L (ref 15–41)
Albumin: 4 g/dL (ref 3.5–5.0)
Alkaline Phosphatase: 82 U/L (ref 38–126)
Anion gap: 12 (ref 5–15)
BUN: 12 mg/dL (ref 8–23)
CO2: 27 mmol/L (ref 22–32)
Calcium: 9.4 mg/dL (ref 8.9–10.3)
Chloride: 101 mmol/L (ref 98–111)
Creatinine, Ser: 0.98 mg/dL (ref 0.44–1.00)
GFR calc Af Amer: >60 mL/min (ref >60)
GFR calc non Af Amer: 53 mL/min — ABNORMAL LOW (ref >60)
Glucose, Bld: 88 mg/dL (ref 70–99)
Potassium: 3.6 mmol/L (ref 3.5–5.1)
Sodium: 140 mmol/L (ref 135–145)
Total Bilirubin: 0.8 mg/dL (ref 0.3–1.2)
Total Protein: 6.9 g/dL (ref 6.5–8.1)

## 2019-10-04 LAB — CBC
HCT: 40.8 % (ref 36.0–46.0)
Hemoglobin: 13.4 g/dL (ref 12.0–15.0)
MCH: 31.1 pg (ref 26.0–34.0)
MCHC: 32.8 g/dL (ref 30.0–36.0)
MCV: 94.7 fL (ref 80.0–100.0)
Platelets: 236 10*3/uL (ref 150–400)
RBC: 4.31 MIL/uL (ref 3.87–5.11)
RDW: 14 % (ref 11.5–15.5)
WBC: 6.7 10*3/uL (ref 4.0–10.5)
nRBC: 0 % (ref 0.0–0.2)

## 2019-10-04 LAB — URINALYSIS, ROUTINE W REFLEX MICROSCOPIC
Bilirubin Urine: NEGATIVE
Glucose, UA: NEGATIVE mg/dL
Hgb urine dipstick: NEGATIVE
Ketones, ur: NEGATIVE mg/dL
Nitrite: NEGATIVE
Protein, ur: NEGATIVE mg/dL
Specific Gravity, Urine: 1.01 (ref 1.005–1.030)
pH: 8 (ref 5.0–8.0)

## 2019-10-04 LAB — TYPE AND SCREEN
ABO/RH(D): A POS
Antibody Screen: NEGATIVE

## 2019-10-04 LAB — ABO/RH: ABO/RH(D): A POS

## 2019-10-04 LAB — PROTIME-INR
INR: 1 (ref 0.8–1.2)
Prothrombin Time: 13.2 seconds (ref 11.4–15.2)

## 2019-10-04 LAB — SARS CORONAVIRUS 2 (TAT 6-24 HRS): SARS Coronavirus 2: NEGATIVE

## 2019-10-04 LAB — APTT: aPTT: 30 seconds (ref 24–36)

## 2019-10-04 NOTE — Progress Notes (Signed)
Abnormal labs resulted (UA). Notified surgeon's office via telephone.

## 2019-10-04 NOTE — Telephone Encounter (Signed)
Preadmit testing called to state that patient has leukocytes in her urine. Called patient to advise and send in a prescription. She stated that she has abxs at home and read the label to me. She has Cipro 500mg . Advised her to take these bid until after her surgery.   York Cerise, CMA

## 2019-10-04 NOTE — Pre-Procedure Instructions (Signed)
[place], [place] [address] Phone: [phone] Fax: [phone]    Your procedure is scheduled on Friday, March 26th.  Report to [facility] Main Entrance "A" at 7:30 A.M., and check in at the Admitting office.  Call this number if you have problems the morning of surgery:  [phone]  Call [phone] if you have any questions prior to your surgery date Monday-Friday 8am-4pm   Remember:  Do not eat or drink after midnight the night before your surgery   Take these medicines the morning of surgery with A SIP OF WATER  doxazosin (CARDURA)  propranolol (INDERAL)  If needed - acetaminophen (TYLENOL)   Follow your surgeon's instructions on when to stop warfarin (COUMADIN).  If no instructions were given by your surgeon then you will need to call the office to get those instructions.    As of today, STOP taking any Aspirin (unless otherwise instructed by your surgeon), Aleve, Naproxen, Ibuprofen, Motrin, Advil, Goody's, BC's, all herbal medications, fish oil, and all vitamins.   The Morning of Surgery  Do not wear jewelry, make-up or nail polish.  Do not wear lotions, powders, perfumes, or deodorant  Do not shave 48 hours prior to surgery.    Do not bring valuables to the hospital.  [facility] is not responsible for any belongings or valuables.  If you are a smoker, DO NOT Smoke 24 hours prior to surgery  If you wear a CPAP at night please bring your mask the morning of surgery   Remember that you must have someone to transport you home after your surgery, and remain with you for 24 hours if you are discharged the same day.  Please bring cases for contacts, glasses, hearing aids, dentures or bridgework because it cannot be worn into surgery.   Leave your suitcase in the car.  After surgery it may be brought to your room.  For patients admitted to the hospital, discharge time will be determined by your treatment  team.  Patients discharged the day of surgery will not be allowed to drive home.   Special instructions:   [facility]- Preparing For Surgery  Before surgery, you can play an important role. Because skin is not sterile, your skin needs to be as free of germs as possible. You can reduce the number of germs on your skin by washing with CHG (chlorahexidine gluconate) Soap before surgery.  CHG is an antiseptic cleaner which kills germs and bonds with the skin to continue killing germs even after washing.    Oral Hygiene is also important to reduce your risk of infection.  Remember - BRUSH YOUR TEETH THE MORNING OF SURGERY WITH YOUR REGULAR TOOTHPASTE  Please do not use if you have an allergy to CHG or antibacterial soaps. If your skin becomes reddened/irritated stop using the CHG.  Do not shave (including legs and underarms) for at least 48 hours prior to first CHG shower. It is OK to shave your face.  Please follow these instructions carefully.   1. Shower the NIGHT BEFORE SURGERY and the MORNING OF SURGERY with CHG Soap.   2. If you chose to wash your hair, wash your hair first as usual with your normal shampoo.  3. After you shampoo, rinse your hair and body thoroughly to remove the shampoo.  4. Use CHG as you would any other liquid soap. You can apply CHG directly to the skin and wash gently with a  scrungie or a clean washcloth.   5. Apply the CHG Soap to your body ONLY FROM THE NECK DOWN.  Do not use on open wounds or open sores. Avoid contact with your eyes, ears, mouth and genitals (private parts). Wash Face and genitals (private parts)  with your normal soap.   6. Wash thoroughly, paying special attention to the area where your surgery will be performed.  7. Thoroughly rinse your body with warm water from the neck down.  8. DO NOT shower/wash with your normal soap after using and rinsing off the CHG Soap.  9. Pat yourself dry with a CLEAN TOWEL.  10. Wear CLEAN PAJAMAS to bed  the night before surgery, wear comfortable clothes the morning of surgery  11. Place CLEAN SHEETS on your bed the night of your first shower and DO NOT SLEEP WITH PETS.  Day of Surgery: Please shower the morning of surgery with the CHG soap Do not apply any deodorants/lotions. Please wear clean clothes to the hospital/surgery center.   Remember to brush your teeth WITH YOUR REGULAR TOOTHPASTE.   Please read over the following fact sheets that you were given.

## 2019-10-04 NOTE — Progress Notes (Signed)
PCP - Dr. Asencion Noble Cardiologist - Dr. Quay Burow  PPM/ICD - denies  Chest x-ray - N/A  EKG - 09/07/2019 Stress Test - 09/12/2019 ECHO - 08/12/2018 Cardiac Cath - 2005  Sleep Study - denies CPAP - N/A  DM: denies  Blood Thinner Instructions: COUMADIN: patient stated was instructed to stop 5 days prior surgery but has not started back taking it since she first stopped back in February for the initial scheduled surgery. Per patient "feels much better off of it". Patient stated, will resume after surgery.  Aspirin Instructions: N/A  ERAS Protcol - No  COVID TEST- Test results pending 10/04/2019. Patient verbalized understanding of self-quarantine instructions.  Anesthesia review: YES, cardiac hx   Patient denies shortness of breath, fever, cough and chest pain at PAT appointment  All instructions explained to the patient, with a verbal understanding of the material. Patient agrees to go over the instructions while at home for a better understanding. Patient also instructed to self quarantine after being tested for COVID-19. The opportunity to ask questions was provided.

## 2019-10-05 ENCOUNTER — Other Ambulatory Visit (HOSPITAL_COMMUNITY): Payer: Medicare Other

## 2019-10-05 NOTE — Anesthesia Preprocedure Evaluation (Addendum)
Anesthesia Evaluation  Patient identified by MRN, date of birth, ID band Patient awake    Reviewed: Allergy & Precautions, NPO status , Patient's Chart, lab work & pertinent test results  History of Anesthesia Complications Negative for: history of anesthetic complications  Airway Mallampati: II  TM Distance: >3 FB Neck ROM: Full    Dental  (+) Edentulous Upper, Partial Lower, Dental Advisory Given   Pulmonary neg pulmonary ROS, former smoker,    Pulmonary exam normal        Cardiovascular hypertension, Pt. on medications and Pt. on home beta blockers + Peripheral Vascular Disease  Normal cardiovascular exam+ dysrhythmias Atrial Fibrillation   Nuclear stress test 09/12/19:  Baseline EKG showed atrial fibrillation with RBBB and nonspecific T wave abnormality with no change during Lexiscan infusion.  The left ventricular ejection fraction is normal (55-65%).  Nuclear stress EF: 61%.  There was no ST segment deviation noted during stress.  The study is normal.  This is a low risk study.    Neuro/Psych negative neurological ROS     GI/Hepatic negative GI ROS, Neg liver ROS,   Endo/Other  negative endocrine ROS  Renal/GU negative Renal ROS     Musculoskeletal negative musculoskeletal ROS (+)   Abdominal   Peds  Hematology negative hematology ROS (+)   Anesthesia Other Findings Day of surgery medications reviewed with the patient.  Reproductive/Obstetrics                            Anesthesia Physical Anesthesia Plan  ASA: III  Anesthesia Plan: General   Post-op Pain Management:    Induction: Intravenous  PONV Risk Score and Plan: 4 or greater and Ondansetron, Dexamethasone, Diphenhydramine and Treatment may vary due to age or medical condition  Airway Management Planned: Oral ETT  Additional Equipment:   Intra-op Plan:   Post-operative Plan: Extubation in OR  Informed  Consent: I have reviewed the patients History and Physical, chart, labs and discussed the procedure including the risks, benefits and alternatives for the proposed anesthesia with the patient or authorized representative who has indicated his/her understanding and acceptance.     Dental advisory given  Plan Discussed with: CRNA and Anesthesiologist  Anesthesia Plan Comments: (PAT note written 10/05/2019 by Myra Gianotti, PA-C. )      Anesthesia Quick Evaluation

## 2019-10-05 NOTE — Progress Notes (Signed)
Anesthesia Chart Review:  Case: [id number] Date/Time: 10/06/19 0915   Procedure: ENDARTERECTOMY CAROTID (Left )   Anesthesia type: General   Pre-op diagnosis: LEFT CAROTID STENOSIS   Location: [facility] OR ROOM 12 / [facility]   Surgeons: Serafina Mitchell, MD      DISCUSSION: Patient is an 86 year old female scheduled for the above procedure. Surgery was initially scheduled for 09/08/19, but she reported several day history of chest tightness at her 09/07/19 PAT visit and surgery was postponed until she could be re-evaluated by her cardiologist Dr. Gwenlyn Found.  Since then she has a non-ischemic stress test.  History includes former smoker (quit 1986), afib (diagnosed ~ 07/2018), right BBB, exertional dyspnea, HTN, HLD, carotid artery disease, renal artery stenosis (s/p right renal artery stent 07/03/04), epistaxis, cervical disc disease, back surgery.   Per PAT VS, BP higher in LUE 153/100 and 144/69 in RUE. She had normal flow hemodynamics in bilateral subclavian arteries per 08/17/19 carotid US.  Last warfarin 09/03/19 (she told cardiology she did not want to resume until after surgery because she felt better off of it). 10/05/19 COVID-19 test negative. Anesthesia team to evaluate on the day of surgery.   VS: BP (!) 144/69   Pulse 82   Temp 36.8 C (Oral)   Resp 18   Ht 5\' 4"  (1.626 m)   Wt 77.7 kg   SpO2 98%   BMI 29.38 kg/m     PROVIDERS: Asencion Noble, MD is PCP Quay Burow, MD is cardiologist   LABS: Labs reviewed: Acceptable for surgery. She had trace leukocytes and rare bacteria on UA, negative nitrites--it appears she was started on Cipro until surgery by [facility]. (all labs ordered are listed, but only abnormal results are displayed)  Labs Reviewed  COMPREHENSIVE METABOLIC PANEL - Abnormal; Notable for the following components:      Result Value   GFR calc non Af Amer 53 (*)    All other components within normal limits  URINALYSIS, ROUTINE W REFLEX MICROSCOPIC - Abnormal; Notable for the  following components:   Color, Urine STRAW (*)    Leukocytes,Ua TRACE (*)    Bacteria, UA RARE (*)    All other components within normal limits  APTT  CBC  PROTIME-INR  TYPE AND SCREEN  ABO/RH     IMAGES: Renal artery [place] 08/17/19: Summary:  Renal:  Right: No evidence of right renal artery stenosis. RRV flow present.     Normal size right kidney. Normal right Resisitive Index.     Normal cortical thickness of right kidney.  Left: No evidence of left renal artery stenosis. LRV flow present.     Normal size of left kidney. Normal left Resistive Index.     Normal cortical thickness of the left kidney.  Mesenteric:  Normal Celiac artery and Superior Mesenteric artery findings. Areas of  limited  visceral study include left kidney size due to bowel gas.    EKG:  EKG 09/07/19: Atrial fibrillation at 77 bpm Right bundle branch block T wave abnormality, consider lateral ischemia Abnormal ECG - I think that overall EKG appears stable when compared to previous tracing.   CV: Nuclear stress test 09/12/19:  Baseline EKG showed atrial fibrillation with RBBB and nonspecific T wave abnormality with no change during Lexiscan infusion.  The left ventricular ejection fraction is normal (55-65%).  Nuclear stress EF: 61%.  There was no ST segment deviation noted during stress.  The study is normal.  This is a low risk study.  Carotid US 08/17/19: Summary:  Right Carotid: Velocities in the right ICA are consistent with a 1-39%  stenosis.  Left Carotid: Velocities in the left ICA are consistent with a 80-99%  stenosis.  Vertebrals: Bilateral vertebral arteries demonstrate antegrade flow.  Subclavians: Normal flow hemodynamics were seen in bilateral subclavian        arteries.    Echo 08/12/18: IMPRESSIONS  1. The left ventricle has normal systolic function of 0000000. The cavity  size is normal. There is no left ventricular wall thickness. Echo evidence   of indeterminate diastolic filling patterns. Elevated left ventricular  end-diastolic pressure. The left  ventricular diastology could not be evaluatedsecondary to atrial  fibrillation.  2. Severely dilated left atrial size.  3. Moderately dilated right atrial size.  4. Normal tricuspid valve.  5. Tricuspid regurgitation is mild.  6. The aortic valve tricuspid. There is mild thickening and sclerosis  without any evidence of stenosis of the aortic valve.  7. The aortic root is normal is size and structure.  8. The inferior vena cava was dilated in size with >50% respiratory  variablity.  9. No atrial level shunt detected by color flow Doppler.     Past Medical History:  Diagnosis Date  . Arthritis   . Atrial fibrillation ([place])   . Cervical disc disease   . Dyspnea    on exertion  . History of kidney stones   . Hyperlipidemia   . Hypertension   . Nosebleed   . RBBB (right bundle branch block)   . Renal artery stenosis [facility])     Past Surgical History:  Procedure Laterality Date  . BACK SURGERY     lumbar interbody fusion  . Cardiolite  12/205   negative  . CAROTID STENT  07-06-04  . CATARACT EXTRACTION    . DOPPLER ECHOCARDIOGRAPHY     normal 2D. borderline concentric LVH  . kidney stent    . renal doppler  08/04/2006   patent right renal artery stent  . TONSILLECTOMY      MEDICATIONS: . acetaminophen (TYLENOL) 500 MG tablet  . diazepam (VALIUM) 5 MG tablet  . diltiazem (CARDIZEM CD) 120 MG 24 hr capsule  . doxazosin (CARDURA) 2 MG tablet  . hydrochlorothiazide (HYDRODIURIL) 25 MG tablet  . loperamide (IMODIUM A-D) 2 MG tablet  . Omega-3 Fatty Acids (FISH OIL) 1000 MG CAPS  . propranolol (INDERAL) 80 MG tablet  . rosuvastatin (CRESTOR) 5 MG tablet  . warfarin (COUMADIN) 5 MG tablet   No current facility-administered medications for this encounter.    Myra Gianotti, PA-C Surgical Short Stay/Anesthesiology [facility] Phone [phone] [facility] Phone  [phone] 10/05/2019 9:41 AM

## 2019-10-06 ENCOUNTER — Encounter (HOSPITAL_COMMUNITY): Payer: Self-pay | Admitting: Surgery

## 2019-10-06 ENCOUNTER — Inpatient Hospital Stay (HOSPITAL_COMMUNITY): Payer: Medicare Other | Admitting: Vascular Surgery

## 2019-10-06 ENCOUNTER — Inpatient Hospital Stay (HOSPITAL_COMMUNITY)
Admission: RE | Admit: 2019-10-06 | Discharge: 2019-10-07 | DRG: 038 | Disposition: A | Discharge status: Home / Self Care | Payer: Medicare Other | Attending: Surgery | Admitting: Surgery

## 2019-10-06 ENCOUNTER — Inpatient Hospital Stay (HOSPITAL_COMMUNITY): Payer: Medicare Other | Admitting: Anesthesiology

## 2019-10-06 ENCOUNTER — Encounter (HOSPITAL_COMMUNITY)
Admission: RE | Disposition: A | Discharge status: Home / Self Care | Payer: Self-pay | Source: Home / Self Care | Attending: Surgery

## 2019-10-06 ENCOUNTER — Other Ambulatory Visit: Payer: Self-pay

## 2019-10-06 DIAGNOSIS — I739 Peripheral vascular disease, unspecified: Secondary | ICD-10-CM | POA: Diagnosis present

## 2019-10-06 DIAGNOSIS — Z87891 Personal history of nicotine dependence: Secondary | ICD-10-CM

## 2019-10-06 DIAGNOSIS — Z79899 Other long term (current) drug therapy: Secondary | ICD-10-CM

## 2019-10-06 DIAGNOSIS — Z888 Allergy status to other drugs, medicaments and biological substances status: Secondary | ICD-10-CM | POA: Diagnosis not present

## 2019-10-06 DIAGNOSIS — I6529 Occlusion and stenosis of unspecified carotid artery: Secondary | ICD-10-CM | POA: Diagnosis present

## 2019-10-06 DIAGNOSIS — I10 Essential (primary) hypertension: Secondary | ICD-10-CM | POA: Diagnosis present

## 2019-10-06 DIAGNOSIS — I482 Chronic atrial fibrillation, unspecified: Secondary | ICD-10-CM | POA: Diagnosis not present

## 2019-10-06 DIAGNOSIS — Z20822 Contact with and (suspected) exposure to covid-19: Secondary | ICD-10-CM | POA: Diagnosis not present

## 2019-10-06 DIAGNOSIS — E7800 Pure hypercholesterolemia, unspecified: Secondary | ICD-10-CM | POA: Diagnosis not present

## 2019-10-06 DIAGNOSIS — F419 Anxiety disorder, unspecified: Secondary | ICD-10-CM | POA: Diagnosis present

## 2019-10-06 DIAGNOSIS — Z7901 Long term (current) use of anticoagulants: Secondary | ICD-10-CM

## 2019-10-06 DIAGNOSIS — E785 Hyperlipidemia, unspecified: Secondary | ICD-10-CM | POA: Diagnosis present

## 2019-10-06 DIAGNOSIS — I6522 Occlusion and stenosis of left carotid artery: Secondary | ICD-10-CM | POA: Diagnosis not present

## 2019-10-06 HISTORY — PX: CAROTID ENDARTERECTOMY: SUR193

## 2019-10-06 HISTORY — PX: PATCH ANGIOPLASTY: SHX6230

## 2019-10-06 HISTORY — DX: Occlusion and stenosis of unspecified carotid artery: I65.29

## 2019-10-06 HISTORY — PX: ENDARTERECTOMY: SHX5162

## 2019-10-06 LAB — POCT ACTIVATED CLOTTING TIME: Activated Clotting Time: 268 seconds

## 2019-10-06 SURGERY — ENDARTERECTOMY, CAROTID
Anesthesia: General | Site: Neck | Laterality: Left

## 2019-10-06 MED ORDER — METOPROLOL TARTRATE 5 MG/5ML IV SOLN: 2.0000 mg | INTRAVENOUS | Status: DC | PRN

## 2019-10-06 MED ORDER — PHENOL 1.4 % MT LIQD: 1.0000 | OROMUCOSAL | Status: DC | PRN

## 2019-10-06 MED ORDER — LACTATED RINGERS IV SOLN: INTRAVENOUS | Status: DC

## 2019-10-06 MED ORDER — DIAZEPAM 5 MG PO TABS: 5.0000 mg | ORAL_TABLET | Freq: Every evening | ORAL | Status: DC | PRN

## 2019-10-06 MED ORDER — BSS IO SOLN
15.0000 mL | Freq: Once | INTRAOCULAR | Status: AC
  Administered 2019-10-06: 15 mL
  Filled 2019-10-06: qty 15

## 2019-10-06 MED ORDER — OXYCODONE HCL 5 MG PO TABS: 5.0000 mg | ORAL_TABLET | ORAL | Status: DC | PRN

## 2019-10-06 MED ORDER — FENTANYL CITRATE (PF) 250 MCG/5ML IJ SOLN
INTRAMUSCULAR | Status: AC
  Filled 2019-10-06: qty 5

## 2019-10-06 MED ORDER — ACETAMINOPHEN 500 MG PO TABS
1000.0000 mg | ORAL_TABLET | Freq: Once | ORAL | Status: AC
  Administered 2019-10-06: 1000 mg via ORAL
  Filled 2019-10-06: qty 2

## 2019-10-06 MED ORDER — PANTOPRAZOLE SODIUM 40 MG PO TBEC
40.0000 mg | DELAYED_RELEASE_TABLET | Freq: Every day | ORAL | Status: DC
  Administered 2019-10-07: 40 mg via ORAL
  Filled 2019-10-06: qty 1

## 2019-10-06 MED ORDER — ACETAMINOPHEN 500 MG PO TABS: 500.0000 mg | ORAL_TABLET | Freq: Four times a day (QID) | ORAL | Status: DC | PRN

## 2019-10-06 MED ORDER — LIDOCAINE 2% (20 MG/ML) 5 ML SYRINGE
INTRAMUSCULAR | Status: DC | PRN
  Administered 2019-10-06: 80 mg via INTRAVENOUS

## 2019-10-06 MED ORDER — DEXAMETHASONE SODIUM PHOSPHATE 10 MG/ML IJ SOLN
INTRAMUSCULAR | Status: DC | PRN
  Administered 2019-10-06: 4 mg via INTRAVENOUS

## 2019-10-06 MED ORDER — SUGAMMADEX SODIUM 200 MG/2ML IV SOLN
INTRAVENOUS | Status: DC | PRN
  Administered 2019-10-06: 150 mg via INTRAVENOUS

## 2019-10-06 MED ORDER — HEPARIN SODIUM (PORCINE) 5000 UNIT/ML IJ SOLN
5000.0000 [IU] | Freq: Three times a day (TID) | INTRAMUSCULAR | Status: DC
  Administered 2019-10-07: 5000 [IU] via SUBCUTANEOUS
  Filled 2019-10-06: qty 1

## 2019-10-06 MED ORDER — ONDANSETRON HCL 4 MG/2ML IJ SOLN
INTRAMUSCULAR | Status: DC | PRN
  Administered 2019-10-06: 4 mg via INTRAVENOUS

## 2019-10-06 MED ORDER — SODIUM CHLORIDE 0.9 % IV SOLN
INTRAVENOUS | Status: DC | PRN
  Administered 2019-10-06: 500 mL

## 2019-10-06 MED ORDER — DEXAMETHASONE SODIUM PHOSPHATE 10 MG/ML IJ SOLN
INTRAMUSCULAR | Status: AC
  Filled 2019-10-06: qty 1

## 2019-10-06 MED ORDER — HEMOSTATIC AGENTS (NO CHARGE) OPTIME
TOPICAL | Status: DC | PRN
  Administered 2019-10-06: 1 via TOPICAL

## 2019-10-06 MED ORDER — 0.9 % SODIUM CHLORIDE (POUR BTL) OPTIME
TOPICAL | Status: DC | PRN
  Administered 2019-10-06 (×2): 1000 mL

## 2019-10-06 MED ORDER — ALBUMIN HUMAN 5 % IV SOLN: INTRAVENOUS | Status: DC | PRN

## 2019-10-06 MED ORDER — DILTIAZEM HCL ER COATED BEADS 120 MG PO CP24
120.0000 mg | ORAL_CAPSULE | Freq: Every day | ORAL | Status: DC
  Administered 2019-10-06: 21:00:00 120 mg via ORAL
  Filled 2019-10-06: qty 1

## 2019-10-06 MED ORDER — ROCURONIUM BROMIDE 10 MG/ML (PF) SYRINGE
PREFILLED_SYRINGE | INTRAVENOUS | Status: DC | PRN
  Administered 2019-10-06: 60 mg via INTRAVENOUS

## 2019-10-06 MED ORDER — HYDRALAZINE HCL 20 MG/ML IJ SOLN: 5.0000 mg | INTRAMUSCULAR | Status: DC | PRN

## 2019-10-06 MED ORDER — SODIUM CHLORIDE 0.9 % IV SOLN: INTRAVENOUS | Status: DC

## 2019-10-06 MED ORDER — KETOROLAC TROMETHAMINE 0.5 % OP SOLN
1.0000 [drp] | Freq: Three times a day (TID) | OPHTHALMIC | Status: DC | PRN
  Administered 2019-10-06 (×2): 1 [drp] via OPHTHALMIC
  Filled 2019-10-06: qty 5

## 2019-10-06 MED ORDER — CELECOXIB 200 MG PO CAPS
200.0000 mg | ORAL_CAPSULE | Freq: Once | ORAL | Status: AC
  Administered 2019-10-06: 200 mg via ORAL
  Filled 2019-10-06: qty 1

## 2019-10-06 MED ORDER — FENTANYL CITRATE (PF) 100 MCG/2ML IJ SOLN: 25.0000 ug | INTRAMUSCULAR | Status: DC | PRN

## 2019-10-06 MED ORDER — DOCUSATE SODIUM 100 MG PO CAPS
100.0000 mg | ORAL_CAPSULE | Freq: Every day | ORAL | Status: DC
  Administered 2019-10-07: 100 mg via ORAL
  Filled 2019-10-06: qty 1

## 2019-10-06 MED ORDER — MAGNESIUM SULFATE 2 GM/50ML IV SOLN: 2.0000 g | Freq: Every day | INTRAVENOUS | Status: DC | PRN

## 2019-10-06 MED ORDER — PHENYLEPHRINE HCL-NACL 10-0.9 MG/250ML-% IV SOLN
INTRAVENOUS | Status: DC | PRN
  Administered 2019-10-06: 25 ug/min via INTRAVENOUS

## 2019-10-06 MED ORDER — FLEET ENEMA 7-19 GM/118ML RE ENEM: 1.0000 | ENEMA | Freq: Once | RECTAL | Status: DC | PRN

## 2019-10-06 MED ORDER — LIDOCAINE HCL (PF) 1 % IJ SOLN
INTRAMUSCULAR | Status: AC
  Filled 2019-10-06: qty 30

## 2019-10-06 MED ORDER — PROMETHAZINE HCL 25 MG/ML IJ SOLN: 6.2500 mg | INTRAMUSCULAR | Status: DC | PRN

## 2019-10-06 MED ORDER — EPHEDRINE SULFATE-NACL 50-0.9 MG/10ML-% IV SOSY
PREFILLED_SYRINGE | INTRAVENOUS | Status: DC | PRN
  Administered 2019-10-06: 15 mg via INTRAVENOUS

## 2019-10-06 MED ORDER — FENTANYL CITRATE (PF) 100 MCG/2ML IJ SOLN
INTRAMUSCULAR | Status: DC | PRN
  Administered 2019-10-06: 100 ug via INTRAVENOUS

## 2019-10-06 MED ORDER — VANCOMYCIN HCL IN DEXTROSE 1-5 GM/200ML-% IV SOLN
1000.0000 mg | INTRAVENOUS | Status: AC
  Administered 2019-10-06: 08:00:00 1000 mg via INTRAVENOUS
  Filled 2019-10-06: qty 200

## 2019-10-06 MED ORDER — SENNOSIDES-DOCUSATE SODIUM 8.6-50 MG PO TABS: 1.0000 | ORAL_TABLET | Freq: Every evening | ORAL | Status: DC | PRN

## 2019-10-06 MED ORDER — POTASSIUM CHLORIDE CRYS ER 20 MEQ PO TBCR: 20.0000 meq | EXTENDED_RELEASE_TABLET | Freq: Every day | ORAL | Status: DC | PRN

## 2019-10-06 MED ORDER — SODIUM CHLORIDE 0.9 % IV SOLN
INTRAVENOUS | Status: AC
  Filled 2019-10-06: qty 1.2

## 2019-10-06 MED ORDER — ROSUVASTATIN CALCIUM 5 MG PO TABS: 5.0000 mg | ORAL_TABLET | ORAL | Status: DC

## 2019-10-06 MED ORDER — PROPOFOL 10 MG/ML IV BOLUS
INTRAVENOUS | Status: DC | PRN
  Administered 2019-10-06: 150 mg via INTRAVENOUS

## 2019-10-06 MED ORDER — BISACODYL 5 MG PO TBEC: 5.0000 mg | DELAYED_RELEASE_TABLET | Freq: Every day | ORAL | Status: DC | PRN

## 2019-10-06 MED ORDER — ONDANSETRON HCL 4 MG/2ML IJ SOLN
INTRAMUSCULAR | Status: AC
  Filled 2019-10-06: qty 2

## 2019-10-06 MED ORDER — HEPARIN SODIUM (PORCINE) 1000 UNIT/ML IJ SOLN
INTRAMUSCULAR | Status: DC | PRN
  Administered 2019-10-06: 8000 [IU] via INTRAVENOUS

## 2019-10-06 MED ORDER — PROPRANOLOL HCL 40 MG PO TABS
160.0000 mg | ORAL_TABLET | Freq: Every day | ORAL | Status: DC
  Administered 2019-10-07: 08:00:00 160 mg via ORAL
  Filled 2019-10-06: qty 4

## 2019-10-06 MED ORDER — ALUM & MAG HYDROXIDE-SIMETH 200-200-20 MG/5ML PO SUSP: 15.0000 mL | ORAL | Status: DC | PRN

## 2019-10-06 MED ORDER — PROPOFOL 10 MG/ML IV BOLUS
INTRAVENOUS | Status: AC
  Filled 2019-10-06: qty 20

## 2019-10-06 MED ORDER — PROTAMINE SULFATE 10 MG/ML IV SOLN
INTRAVENOUS | Status: DC | PRN
  Administered 2019-10-06: 50 mg via INTRAVENOUS

## 2019-10-06 MED ORDER — DOXAZOSIN MESYLATE 2 MG PO TABS: 2.0000 mg | ORAL_TABLET | Freq: Every day | ORAL | Status: DC

## 2019-10-06 MED ORDER — CHLORHEXIDINE GLUCONATE CLOTH 2 % EX PADS: 6.0000 | MEDICATED_PAD | Freq: Once | CUTANEOUS | Status: DC

## 2019-10-06 MED ORDER — HYDROCHLOROTHIAZIDE 25 MG PO TABS
25.0000 mg | ORAL_TABLET | Freq: Every day | ORAL | Status: DC
  Administered 2019-10-07: 25 mg via ORAL
  Filled 2019-10-06: qty 1

## 2019-10-06 MED ORDER — GUAIFENESIN-DM 100-10 MG/5ML PO SYRP: 15.0000 mL | ORAL_SOLUTION | ORAL | Status: DC | PRN

## 2019-10-06 MED ORDER — LABETALOL HCL 5 MG/ML IV SOLN: 10.0000 mg | INTRAVENOUS | Status: DC | PRN

## 2019-10-06 MED ORDER — POLYMYXIN B-TRIMETHOPRIM 10000-0.1 UNIT/ML-% OP SOLN
1.0000 [drp] | Freq: Three times a day (TID) | OPHTHALMIC | Status: AC
  Administered 2019-10-06 – 2019-10-07 (×3): 1 [drp] via OPHTHALMIC
  Filled 2019-10-06: qty 10

## 2019-10-06 MED ORDER — HYDROMORPHONE HCL 1 MG/ML IJ SOLN: 0.5000 mg | INTRAMUSCULAR | Status: DC | PRN

## 2019-10-06 MED ORDER — LIDOCAINE 2% (20 MG/ML) 5 ML SYRINGE
INTRAMUSCULAR | Status: AC
  Filled 2019-10-06: qty 5

## 2019-10-06 MED ORDER — ROCURONIUM BROMIDE 10 MG/ML (PF) SYRINGE
PREFILLED_SYRINGE | INTRAVENOUS | Status: AC
  Filled 2019-10-06: qty 10

## 2019-10-06 MED ORDER — SODIUM CHLORIDE 0.9 % IV SOLN: 500.0000 mL | Freq: Once | INTRAVENOUS | Status: DC | PRN

## 2019-10-06 MED ORDER — ONDANSETRON HCL 4 MG/2ML IJ SOLN: 4.0000 mg | Freq: Four times a day (QID) | INTRAMUSCULAR | Status: DC | PRN

## 2019-10-06 MED ORDER — VANCOMYCIN HCL IN DEXTROSE 1-5 GM/200ML-% IV SOLN
1000.0000 mg | Freq: Two times a day (BID) | INTRAVENOUS | Status: AC
  Administered 2019-10-06 – 2019-10-07 (×2): 1000 mg via INTRAVENOUS
  Filled 2019-10-06 (×2): qty 200

## 2019-10-06 MED ORDER — LOPERAMIDE HCL 2 MG PO CAPS: 2.0000 mg | ORAL_CAPSULE | Freq: Every day | ORAL | Status: DC | PRN

## 2019-10-06 MED ORDER — SODIUM CHLORIDE 0.9 % IV SOLN
0.0125 ug/kg/min | INTRAVENOUS | Status: AC
  Administered 2019-10-06: .05 ug/kg/min via INTRAVENOUS
  Filled 2019-10-06: qty 2000

## 2019-10-06 SURGICAL SUPPLY — 52 items
ADH SKN CLS APL DERMABOND .7 (GAUZE/BANDAGES/DRESSINGS) ×1
CANISTER SUCT 3000ML PPV (MISCELLANEOUS) ×3 IMPLANT
CATH ROBINSON RED A/P 18FR (CATHETERS) ×3 IMPLANT
CATH SUCT 10FR WHISTLE TIP (CATHETERS) ×3 IMPLANT
CLIP VESOCCLUDE MED 6/CT (CLIP) ×3 IMPLANT
CLIP VESOCCLUDE SM WIDE 6/CT (CLIP) ×3 IMPLANT
COVER PROBE W GEL 5X96 (DRAPES) IMPLANT
COVER WAND RF STERILE (DRAPES) ×3 IMPLANT
DERMABOND ADVANCED (GAUZE/BANDAGES/DRESSINGS) ×2
DERMABOND ADVANCED .7 DNX12 (GAUZE/BANDAGES/DRESSINGS) ×1 IMPLANT
DRAIN CHANNEL 15F RND FF W/TCR (WOUND CARE) IMPLANT
ELECT REM PT RETURN 9FT ADLT (ELECTROSURGICAL) ×3
ELECTRODE REM PT RTRN 9FT ADLT (ELECTROSURGICAL) ×1 IMPLANT
EVACUATOR SILICONE 100CC (DRAIN) IMPLANT
GLOVE BIO SURGEON STRL SZ7 (GLOVE) ×2 IMPLANT
GLOVE BIOGEL PI IND STRL 6.5 (GLOVE) IMPLANT
GLOVE BIOGEL PI IND STRL 7.5 (GLOVE) ×1 IMPLANT
GLOVE BIOGEL PI INDICATOR 6.5 (GLOVE) ×2
GLOVE BIOGEL PI INDICATOR 7.5 (GLOVE) ×4
GLOVE SURG SS PI 7.5 STRL IVOR (GLOVE) ×5 IMPLANT
GOWN STRL REUS W/ TWL LRG LVL3 (GOWN DISPOSABLE) ×2 IMPLANT
GOWN STRL REUS W/ TWL XL LVL3 (GOWN DISPOSABLE) ×1 IMPLANT
GOWN STRL REUS W/TWL LRG LVL3 (GOWN DISPOSABLE) ×6
GOWN STRL REUS W/TWL XL LVL3 (GOWN DISPOSABLE) ×3
HEMOSTAT SNOW SURGICEL 2X4 (HEMOSTASIS) IMPLANT
HEMOSTAT SURGICEL 2X14 (HEMOSTASIS) ×2 IMPLANT
INSERT FOGARTY SM (MISCELLANEOUS) IMPLANT
KIT BASIN OR (CUSTOM PROCEDURE TRAY) ×3 IMPLANT
KIT SHUNT ARGYLE CAROTID ART 6 (VASCULAR PRODUCTS) IMPLANT
KIT TURNOVER KIT B (KITS) ×3 IMPLANT
NDL HYPO 25GX1X1/2 BEV (NEEDLE) IMPLANT
NEEDLE HYPO 25GX1X1/2 BEV (NEEDLE) IMPLANT
NS IRRIG 1000ML POUR BTL (IV SOLUTION) ×7 IMPLANT
PACK CAROTID (CUSTOM PROCEDURE TRAY) ×3 IMPLANT
PAD ARMBOARD 7.5X6 YLW CONV (MISCELLANEOUS) ×6 IMPLANT
PATCH VASC XENOSURE 1CMX6CM (Vascular Products) ×3 IMPLANT
PATCH VASC XENOSURE 1X6 (Vascular Products) IMPLANT
POSITIONER HEAD DONUT 9IN (MISCELLANEOUS) ×3 IMPLANT
SET WALTER ACTIVATION W/DRAPE (SET/KITS/TRAYS/PACK) ×2 IMPLANT
SHUNT CAROTID BYPASS 10 (VASCULAR PRODUCTS) IMPLANT
SHUNT CAROTID BYPASS 12FRX15.5 (VASCULAR PRODUCTS) IMPLANT
SUT ETHILON 3 0 PS 1 (SUTURE) IMPLANT
SUT PROLENE 6 0 BV (SUTURE) ×8 IMPLANT
SUT PROLENE 7 0 BV1 MDA (SUTURE) ×2 IMPLANT
SUT SILK 3 0 (SUTURE)
SUT SILK 3-0 18XBRD TIE 12 (SUTURE) IMPLANT
SUT VIC AB 3-0 SH 27 (SUTURE) ×6
SUT VIC AB 3-0 SH 27X BRD (SUTURE) ×2 IMPLANT
SUT VIC AB 3-0 X1 27 (SUTURE) ×3 IMPLANT
SYR CONTROL 10ML LL (SYRINGE) IMPLANT
TOWEL GREEN STERILE (TOWEL DISPOSABLE) ×3 IMPLANT
WATER STERILE IRR 1000ML POUR (IV SOLUTION) ×3 IMPLANT

## 2019-10-06 NOTE — Progress Notes (Signed)
Status post left carotid endarterectomy. Hemodynamically stable Left neck incision is healing appropriately without evidence of hematoma She has a slight marginal mandibular neuropraxia otherwise she is neurologically intact. She is complaining of pain in her right eye.  Corneal abrasion is suspected.  This is been discussed with anesthesia.  She has appointments that have been ordered.  I plan on starting anticoagulation back tomorrow and letting her go home if her eye is no longer bothering her  Annamarie Major

## 2019-10-06 NOTE — Anesthesia Procedure Notes (Signed)
Arterial Line Insertion Start/End3/26/2021 8:55 AM, 10/06/2019 9:00 AM Performed by: Barrington Ellison, CRNA, CRNA  Patient location: Pre-op. Preanesthetic checklist: patient identified, risks and benefits discussed and pre-op evaluation Lidocaine 1% used for infiltration Left, radial was placed Catheter size: 20 G Maximum sterile barriers used   Attempts: 1 Procedure performed without using ultrasound guided technique. Following insertion, dressing applied and Biopatch. Post procedure assessment: normal  Patient tolerated the procedure well with no immediate complications.

## 2019-10-06 NOTE — Op Note (Signed)
Patient [name] MRN: [id number] DOB: [birth year] Sex: female  10/06/2019 Pre-operative Diagnosis: Asymptomatic   left carotid stenosis Post-operative diagnosis:  Same Surgeon:  Annamarie Major Assistants: Laurence Slate Procedure:    left carotid Endarterectomy with bovine pericardial patch angioplasty Anesthesia:  General Blood Loss: 100 Specimens: None  Findings: 80%stenosis; Thrombus: None  Indications: The patient was found to have an asymptomatic left carotid stenosis after auscultation of a bruit on physical exam.  She comes in today for carotid endarterectomy.  Her procedure was delayed due to cardiac testing.  Procedure:  The patient was identified in the holding area and taken to [place] 12  The patient was then placed supine on the table.   General endotrachial anesthesia was administered.  The patient was prepped and draped in the usual sterile fashion.  A time out was called and antibiotics were administered.  The incision was made along the anterior border of the left sternocleidomastoid muscle.  Cautery was used to dissect through the subcutaneous tissue.  The platysma muscle was divided with cautery.  The internal jugular vein was exposed along its anterior medial border.  The common facial vein was exposed and then divided between 2-0 silk ties and metal clips.  The common carotid artery was then circumferentially exposed and encircled with an umbilical tape.  The vagus nerve was identified and protected.  Next sharp dissection was used to expose the external carotid artery and the superior thyroid artery.  The were encircled with a blue vessel loop and a 2-0 silk tie respectively.  Finally, the internal carotid was carefully dissected free.  An umbilical tape was placed around the internal carotid artery distal to the diseased segment.  The hypoglossal nerve was visualized throughout and protected.  The patient was given systemic heparinization.  A bovine carotid patch was  selected and prepared on the back table.  A 10 french shunt was also prepared.  After blood pressure readings were appropriate and the heparin had been given time to circulate, the internal carotid artery was occluded with a baby Gregory clamp.  The external and common carotid arteries were then occluded with vascular clamps and the 2-0 tie tightened on the superior thyroid artery.  A #11 blade was used to make an arteriotomy in the common carotid artery.  This was extended with Potts scissors along the anterior and lateral border of the common and internal carotid artery.  Approximately 80% stenosis was identified.  There was no thrombus identified.  The 10 french shunt was not placed, as there was excellent backbleeding.  A kleiner kuntz elevator was used to perform endarterectomy.  An eversion endarterectomy was performed in the external carotid artery.  A good distal endpoint was obtained in the internal carotid artery.  The specimen was removed and sent to pathology.  Heparinized saline was used to irrigate the endarterectomized field.  All potential embolic debris was removed.  Bovine pericardial patch angioplasty was then performed using a running 6-0 Prolene.  The common internal and external carotid arteries were all appropriately flushed. The artery was again irrigated with heparin saline.  The anastomosis was then secured. The clamp was first released on the external carotid artery followed by the common carotid artery approximately 30 seconds later, bloodflow was reestablish through the internal carotid artery.  Next, a hand-held  Doppler was used to evaluate the signals in the common, external, and internal  carotid arteries, all of which had appropriate signals. I then administered  50 mg protamine. The wound was then irrigated.  After hemostasis was achieved, the carotid sheath was reapproximated with 3-0 Vicryl. The  platysma muscle was reapproximated with running 3-0 Vicryl. The skin  was  closed with 4-0 Vicryl. Dermabond was placed on the skin. The  patient was then successfully extubated. Her neurologic exam was  similar to his preprocedural exam. The patient was then taken to recovery room  in stable condition. There were no complications.     Disposition:  To PACU in stable condition.  Relevant Operative Details: Approximately 80% stenosis at the carotid bifurcation.  The patient had a ulcerated lesion with evidence of prior hemorrhage.  No shunt was utilized.  She had excellent backbleeding.  The artery was somewhat redundant.  I elected not to resect the artery.  There was a mild kink at the distal patch which I was able to straighten out with suture manipulation of the carotid artery.  Theotis Burrow, M.D., [facility] [place] Office: [phone] Pager:  [phone]

## 2019-10-06 NOTE — Anesthesia Postprocedure Evaluation (Addendum)
Anesthesia Post Note  Patient: Savannah Kirk  Procedure(s) Performed: LEFT ENDARTERECTOMY CAROTID (Left Neck) Patch Angioplasty using a Xenosure Biologic Patch (Left Neck)     Patient location during evaluation: PACU Anesthesia Type: General Level of consciousness: sedated Pain management: pain level controlled Vital Signs Assessment: post-procedure vital signs reviewed and stable Respiratory status: spontaneous breathing and respiratory function stable Cardiovascular status: stable Postop Assessment: no apparent nausea or vomiting Anesthetic complications: yes Anesthetic complication details: injury of corneaComments: Called by nsg staff to 4E25 for complaint of eye pain.  Pt evaluated.  Probable corneal abrasion.  Orders given.    Last Vitals:  Vitals:   10/06/19 1122 10/06/19 1145  BP: (!) 139/57   Pulse: 95 85  Resp: 18 17  Temp: (!) 36.1 C   SpO2: 92% 93%    Last Pain:  Vitals:   10/06/19 1122  TempSrc:   PainSc: Asleep                 Zeyad Delaguila DANIEL

## 2019-10-06 NOTE — Addendum Note (Signed)
Addendum  created 10/06/19 1337 by Duane Boston, MD   Order list changed

## 2019-10-06 NOTE — Addendum Note (Signed)
Addendum  created 10/06/19 1416 by Duane Boston, MD   Clinical Note Signed

## 2019-10-06 NOTE — Progress Notes (Signed)
MOBILITY TEAM - Progress Note   10/06/19 1408  Mobility  Activity Ambulated in room  Level of Assistance Modified independent, requires aide device or extra time  Assistive Device None  Distance Ambulated (ft) 14 ft  Mobility Response Tolerated well  Mobility performed by Mobility specialist  Bed Position Chair   Patient moving very well post-op. Motivated to get out of bed, but ambulation distance limited by eye pain.   Post-activity: HR 89-95, BP 136/82   Mabeline Caras, PT, DPT Mobility Team Pager [phone]

## 2019-10-06 NOTE — Discharge Instructions (Signed)
   [facility] [place]  Discharge Instructions   Carotid Endarterectomy (CEA)  Please refer to the following instructions for your post-procedure care. Your surgeon or physician assistant will discuss any changes with you.  Activity  You are encouraged to walk as much as you can. You can slowly return to normal activities but must avoid strenuous activity and heavy lifting until your doctor tell you it's OK. Avoid activities such as vacuuming or swinging a golf club. You can drive after one week if you are comfortable and you are no longer taking prescription pain medications. It is normal to feel tired for serval weeks after your surgery. It is also normal to have difficulty with sleep habits, eating, and bowel movements after surgery. These will go away with time.  Bathing/Showering  You may shower after you come home. Do not soak in a bathtub, hot tub, or swim until the incision heals completely.  Incision Care  Shower every day. Clean your incision with mild soap and water. Pat the area dry with a clean towel. You do not need a bandage unless otherwise instructed. Do not apply any ointments or creams to your incision. You may have skin glue on your incision. Do not peel it off. It will come off on its own in about one week. Your incision may feel thickened and raised for several weeks after your surgery. This is normal and the skin will soften over time. For Men Only: It's OK to shave around the incision but do not shave the incision itself for 2 weeks. It is common to have numbness under your chin that could last for several months.  Diet  Resume your normal diet. There are no special food restrictions following this procedure. A low fat/low cholesterol diet is recommended for all patients with vascular disease. In order to heal from your surgery, it is CRITICAL to get adequate nutrition. Your body requires vitamins, minerals, and protein. Vegetables are the best  source of vitamins and minerals. Vegetables also provide the perfect balance of protein. Processed food has little nutritional value, so try to avoid this.        Medications  Resume taking all of your medications unless your doctor or physician assistant tells you not to. If your incision is causing pain, you may take over-the- counter pain relievers such as acetaminophen (Tylenol). If you were prescribed a stronger pain medication, please be aware these medications can cause nausea and constipation. Prevent nausea by taking the medication with a snack or meal. Avoid constipation by drinking plenty of fluids and eating foods with a high amount of fiber, such as fruits, vegetables, and grains. Do not take Tylenol if you are taking prescription pain medications.  Follow Up  Our office will schedule a follow up appointment 2-3 weeks following discharge.  Please call us immediately for any of the following conditions  Increased pain, redness, drainage (pus) from your incision site. Fever of 101 degrees or higher. If you should develop stroke (slurred speech, difficulty swallowing, weakness on one side of your body, loss of vision) you should call 911 and go to the nearest emergency room.  Reduce your risk of vascular disease:  Stop smoking. If you would like help call [facility] at 1-800-QUIT-NOW (1-800-784-8669) or [place] at 336-586-4000. Manage your cholesterol Maintain a desired weight Control your diabetes Keep your blood pressure down  If you have any questions, please call the office at [phone].   

## 2019-10-06 NOTE — H&P (Signed)
Vascular and [facility]in Specialist of [facility]  Patient [name] MRN: [id number] DOB: [birth year] Sex: female  REQUESTING PROVIDER:  Dr. Gwenlyn Found  REASON FOR CONSULT:  Carotid stenosis  HISTORY OF PRESENT ILLNESS:  Savannah Kirk is a 86 y.o. female, who is referred for evaluation of asymptomatic carotid stenosis that was detected by a bruit on physical exam. This led to an ultrasound which showed greater than 80% left carotid stenosis.. She denies amaurosis fugax, numbness or weakness in either extremity and slurred speech.  Patient has a history of peripheral vascular disease having undergone right renal artery stenting in 2005. She is medically managed for hypertension. She suffers from hypercholesterolemia but is not taking a statin due to intolerance. She does have a history of spinal stenosis and some right lower extremity discomfort. She is on Coumadin for A. fib which is rate controlled. She is a former smoker.  PAST MEDICAL HISTORY       Past Medical History:  Diagnosis Date  . Anxiety   . Atrial fibrillation ([place])   . Cervical disc disease   . Hyperlipidemia   . Hypertension   . Nosebleed   . RBBB (right bundle branch block)   . Renal artery stenosis ([facility])    FAMILY HISTORY        Family History  Problem Relation Age of Onset  . Hypertension Mother   . Hypertension Sister   . Diabetes Sister    SOCIAL HISTORY:   Social History        Socioeconomic History  . Marital status: Married    Spouse name: Not on file  . Number of children: Not on file  . Years of education: Not on file  . Highest education level: Not on file  Occupational History  . Not on file  Tobacco Use  . Smoking status: Former Smoker    Types: Cigarettes    Quit date: 06/14/1985    Years since quitting: 34.2  . Smokeless tobacco: Never Used  Substance and Sexual Activity  . Alcohol use: No  . Drug use: No  . Sexual activity: Not on file  Other Topics Concern  . Not on file  Social History  Narrative  . Not on file   Social Determinants of Health      Financial Resource Strain:   . Difficulty of Paying Living Expenses: Not on file  Food Insecurity:   . Worried About Charity fundraiser in the Last Year: Not on file  . Ran Out of Food in the Last Year: Not on file  Transportation Needs:   . Lack of Transportation (Medical): Not on file  . Lack of Transportation (Non-Medical): Not on file  Physical Activity:   . Days of Exercise per Week: Not on file  . Minutes of Exercise per Session: Not on file  Stress:   . Feeling of Stress : Not on file  Social Connections:   . Frequency of Communication with Friends and Family: Not on file  . Frequency of Social Gatherings with Friends and Family: Not on file  . Attends Religious Services: Not on file  . Active Member of Clubs or Organizations: Not on file  . Attends Archivist Meetings: Not on file  . Marital Status: Not on file  Intimate Partner Violence:   . Fear of Current or Ex-Partner: Not on file  . Emotionally Abused: Not on file  . Physically Abused: Not on file  . Sexually Abused: Not on file  ALLERGIES:        Allergies  Allergen Reactions  . Aspirin Other (See Comments)    Nose bleeds  . Penicillins Other (See Comments)    Skins comes off tongue   CURRENT MEDICATIONS:         Current Outpatient Medications  Medication Sig Dispense Refill  . diazepam (VALIUM) 5 MG tablet Take 5 mg by mouth daily as needed for anxiety.    Marland Kitchen doxazosin (CARDURA) 2 MG tablet Take 1 mg by mouth daily.     . hydrochlorothiazide (HYDRODIURIL) 25 MG tablet Take 25 mg by mouth daily.     . Omega-3 Fatty Acids (FISH OIL) 1000 MG CAPS Take 1 capsule by mouth daily.    . propranolol (INDERAL) 80 MG tablet Take 160 mg by mouth daily.    . rosuvastatin (CRESTOR) 5 MG tablet Take 1 tablet by mouth twice weekly as tolerated 30 tablet 3  . warfarin (COUMADIN) 5 MG tablet Take 1 tablet (5 mg total) by mouth daily. 90 tablet 3   . diltiazem (CARDIZEM CD) 120 MG 24 hr capsule Take 1 capsule (120 mg total) by mouth daily. 90 capsule 3   No current facility-administered medications for this visit.   REVIEW OF SYSTEMS:  [X]  denotes positive finding, [ ]  denotes negative finding  Cardiac  Comments:  Chest pain or chest pressure: x   Shortness of breath upon exertion: x   Short of breath when lying flat:    Irregular heart rhythm: x       Vascular    Pain in calf, thigh, or hip brought on by ambulation:    Pain in feet at night that wakes you up from your sleep:     Blood clot in your veins:    Leg swelling:         Pulmonary    Oxygen at home:    Productive cough:     Wheezing:         Neurologic    Sudden weakness in arms or legs:     Sudden numbness in arms or legs:     Sudden onset of difficulty speaking or slurred speech:    Temporary loss of vision in one eye:     Problems with dizziness:         Gastrointestinal    Blood in stool:     Vomited blood:         Genitourinary    Burning when urinating:     Blood in urine:        Psychiatric    Major depression:         Hematologic    Bleeding problems:    Problems with blood clotting too easily:        Skin    Rashes or ulcers:        Constitutional    Fever or chills:     PHYSICAL EXAM:      Vitals:   09/04/19 1450  BP: 139/77  Pulse: 74  Resp: 20  Temp: 97.9 F (36.6 C)  SpO2: 95%  Weight: 172 lb (78 kg)  Height: 5' 4"  (1.626 m)   GENERAL: The patient is a well-nourished female, in no acute distress. The vital signs are documented above.  CARDIAC: There is a regular rate and rhythm.  VASCULAR: Left carotid bruit  PULMONARY: Nonlabored respirations  MUSCULOSKELETAL: There are no major deformities or cyanosis.  NEUROLOGIC: No focal weakness or paresthesias are detected.  SKIN:  There are no ulcers or rashes noted.  PSYCHIATRIC: The patient has a normal affect.  STUDIES:  I have reviewed the following carotid duplex:    Right Carotid: Velocities in the right ICA are consistent with a 1-39%  stenosis.   Left Carotid: Velocities in the left ICA are consistent with a 80-99%  stenosis.   Vertebrals: Bilateral vertebral arteries demonstrate antegrade flow.  Subclavians: Normal flow hemodynamics were seen in bilateral subclavian  arteries.  Bifurcation is at the hyoid. Stenosis is at the bifurcation  ASSESSMENT and PLAN  Asymptomatic left carotid stenosis: We discussed the possibility of stenting versus endarterectomy. I think she is a better candidate for endarterectomy. I discussed the risk and benefits including the risk of nerve injury, stroke, bleeding, and cardiopulmonary complications. All of her questions were answered. She is on Coumadin for atrial fibrillation which will need to be discontinued. We have scheduled her surgery for Friday, February 26.  She does have a statin allergy, however she says that she recently met with her pharmacist and she is getting a prescription called in for twice weekly generic Crestor. If she has not gotten this while she is in the hospital, we will follow-up with pharmacy to make sure that she has the appropriate medication.  Leia Alf, MD, FACS  [facility] of [facility] [phone]  Pager [phone]   Cleared by cardiology to proceed.  She remains symptom free.  WE will plan for left CEA as previously scheduled.  All questions answered.  Annamarie Major

## 2019-10-06 NOTE — Transfer of Care (Signed)
Immediate Anesthesia Transfer of Care Note  Patient: Savannah Kirk  Procedure(s) Performed: LEFT ENDARTERECTOMY CAROTID (Left Neck) Patch Angioplasty using a Xenosure Biologic Patch (Left Neck)  Patient Location: PACU  Anesthesia Type:General  Level of Consciousness: awake and oriented  Airway & Oxygen Therapy: Patient Spontanous Breathing and Patient connected to nasal cannula oxygen  Post-op Assessment: Report given to RN and Patient moving all extremities X 4  Post vital signs: Reviewed and stable  Last Vitals:  Vitals Value Taken Time  BP    Temp    Pulse 95 10/06/19 1121  Resp 18 10/06/19 1121  SpO2 89 % 10/06/19 1121  Vitals shown include unvalidated device data.  Last Pain:  Vitals:   10/06/19 0753  TempSrc: Oral  PainSc:          Complications: No apparent anesthesia complications

## 2019-10-06 NOTE — Anesthesia Procedure Notes (Signed)

## 2019-10-06 NOTE — Progress Notes (Signed)
   Patient is followed by Cardiology will hold Coumadin for A fib until exam tomorrow.  If no hematoma and stable they can restart the Coumadin at discharge.  Roxy Horseman PA-C

## 2019-10-07 LAB — CBC
HCT: 35.5 % — ABNORMAL LOW (ref 36.0–46.0)
Hemoglobin: 11.7 g/dL — ABNORMAL LOW (ref 12.0–15.0)
MCH: 30.9 pg (ref 26.0–34.0)
MCHC: 33 g/dL (ref 30.0–36.0)
MCV: 93.7 fL (ref 80.0–100.0)
Platelets: 199 10*3/uL (ref 150–400)
RBC: 3.79 MIL/uL — ABNORMAL LOW (ref 3.87–5.11)
RDW: 14.3 % (ref 11.5–15.5)
WBC: 9.5 10*3/uL (ref 4.0–10.5)
nRBC: 0 % (ref 0.0–0.2)

## 2019-10-07 LAB — BASIC METABOLIC PANEL
Anion gap: 11 (ref 5–15)
BUN: 18 mg/dL (ref 8–23)
CO2: 24 mmol/L (ref 22–32)
Calcium: 9 mg/dL (ref 8.9–10.3)
Chloride: 102 mmol/L (ref 98–111)
Creatinine, Ser: 0.92 mg/dL (ref 0.44–1.00)
GFR calc Af Amer: >60 mL/min (ref >60)
GFR calc non Af Amer: 58 mL/min — ABNORMAL LOW (ref >60)
Glucose, Bld: 167 mg/dL — ABNORMAL HIGH (ref 70–99)
Potassium: 3.8 mmol/L (ref 3.5–5.1)
Sodium: 137 mmol/L (ref 135–145)

## 2019-10-07 MED ORDER — OXYCODONE HCL 5 MG PO TABS: 5.0000 mg | ORAL_TABLET | Freq: Four times a day (QID) | ORAL | 0 refills | Status: DC | PRN

## 2019-10-07 NOTE — Progress Notes (Signed)
   VASCULAR SURGERY ASSESSMENT & PLAN:   1 Day Post-Op LEFT CEA: Doing well.  Home today.  Follow-up with Dr. Trula Slade in 2 to 3 weeks.   SUBJECTIVE:   No complaints.  PHYSICAL EXAM:   Vitals:   10/06/19 2300 10/07/19 0315 10/07/19 0814 10/07/19 0821  BP: 108/71 (!) 110/52 128/73 128/73  Pulse: 99 96 92 92  Resp: 20 20 18    Temp: 98.2 F (36.8 C) 98 F (36.7 C) 98.2 F (36.8 C)   TempSrc: Oral Oral    SpO2: 94% 98% 97%   Weight:      Height:       NEURO: No focal weakness or paresthesias.  Marginal mandibular nerve dysfunction is improved. Her incision looks fine.  LABS:   Lab Results  Component Value Date   WBC 9.5 10/07/2019   HGB 11.7 (L) 10/07/2019   HCT 35.5 (L) 10/07/2019   MCV 93.7 10/07/2019   PLT 199 10/07/2019   Lab Results  Component Value Date   CREATININE 0.92 10/07/2019    PROBLEM LIST:    Active Problems:   Carotid stenosis   CURRENT MEDS:   . diltiazem  120 mg Oral QHS  . docusate sodium  100 mg Oral Daily  . doxazosin  2 mg Oral QPC lunch  . heparin  5,000 Units Subcutaneous Q8H  . hydrochlorothiazide  25 mg Oral Daily  . pantoprazole  40 mg Oral Daily  . propranolol  160 mg Oral Daily  . [START ON 10/09/2019] rosuvastatin  5 mg Oral Once per day on Mon Thu    Deitra Mayo Office: [phone] 10/07/2019

## 2019-10-07 NOTE — Progress Notes (Signed)
Pt ambulated around unit x 470 feet independently will continue to monitor

## 2019-10-07 NOTE — Progress Notes (Signed)
Discharge instructions provided to patient. All medications, follow up appointments, and discharge instructions provided. IV out. Monitor off CCMD notified. Patient understands to resume normal dose of Warfarin per PA.Marland Kitchen Understands to call Monday for an INR check. Discharging to home by daughter.  Paulene Floor, RN

## 2019-10-07 NOTE — Discharge Summary (Signed)
Discharge Summary     Savannah Kirk 1934/03/10 86 y.o. female  [id number]  Admission Date: 10/06/2019  Discharge Date: 10/07/2019 Physician: Serafina Mitchell, MD  Admission Diagnosis: Carotid stenosis [I65.29]   HPI:   This is a 86 y.o. female presents with asymptomatic left carotid stenosis.  Hospital Course:  The patient was admitted to the hospital and taken to the operating room on 10/06/2019 and underwent left carotid endarterectomy.    Findings: Approximately 80% stenosis was identified  The pt tolerated the procedure well and was transported to the PACU in excellent condition.   By POD 1, the patient's neuro status is intact. VSS and afebrile. She was noted to have a slight marginal mandibular neuropraxia in PACU as well as right eye pain.  Her neuropraxia is improved today and her right eye pain has resolved. She is chewing and swallowing without difficulty. Her incision is well approximated without signs of hematoma or infection.  The remainder of the hospital course consisted of increasing mobilization and increasing intake of solids without difficulty.   Recent Labs    10/04/19 1328 10/07/19 0339  NA 140 137  K 3.6 3.8  CL 101 102  CO2 27 24  GLUCOSE 88 167*  BUN 12 18  CALCIUM 9.4 9.0   Recent Labs    10/04/19 1328 10/07/19 0339  WBC 6.7 9.5  HGB 13.4 11.7*  HCT 40.8 35.5*  PLT 236 199   Recent Labs    10/04/19 1328  INR 1.0     Discharge Instructions    Discharge patient   Complete by: As directed    Discharge disposition: 01-Home or Self Care   Discharge patient date: 10/07/2019      Discharge Diagnosis:  Carotid stenosis [I65.29]  Secondary Diagnosis: Patient Active Problem List   Diagnosis Date Noted  . Carotid stenosis 10/06/2019  . Carotid artery disease ([place]) 08/30/2019  . Encounter for therapeutic drug monitoring 03/13/2019  . Atrial fibrillation, chronic ([place]) 08/31/2018  . Dyspnea on exertion 08/31/2018  .  Hyperlipidemia 11/02/2014  . Renal artery stenosis ([place]) 06/14/2013  . Essential hypertension 06/14/2013   Past Medical History:  Diagnosis Date  . Arthritis   . Atrial fibrillation ([place])   . Cervical disc disease   . Dyspnea    on exertion  . History of kidney stones   . Hyperlipidemia   . Hypertension   . Nosebleed   . RBBB (right bundle branch block)   . Renal artery stenosis ([place])   . Stenosis of carotid artery     Allergies as of 10/07/2019      Reactions   Aspirin Other (See Comments)   Nose bleeds   Penicillins Other (See Comments)   Skins comes off tongue Did it involve swelling of the face/tongue/throat, SOB, or low BP? No Did it involve sudden or severe rash/hives, skin peeling, or any reaction on the inside of your mouth or nose? No Did you need to seek medical attention at a hospital or doctor's office? Yes When did it last happen?42-23 years old If all above answers are "NO", may proceed with cephalosporin use.      Medication List    TAKE these medications   acetaminophen 500 MG tablet Commonly known as: TYLENOL Take 500-1,000 mg by mouth every 6 (six) hours as needed (for pain.).   diazepam 5 MG tablet Commonly known as: VALIUM Take 5 mg by mouth at bedtime as needed for sedation.   diltiazem 120 MG 24  hr capsule Commonly known as: CARDIZEM CD Take 1 capsule (120 mg total) by mouth daily. What changed: when to take this   doxazosin 2 MG tablet Commonly known as: CARDURA Take 2 mg by mouth daily after lunch.   Fish Oil 1000 MG Caps Take 1,000 mg by mouth daily after lunch.   hydrochlorothiazide 25 MG tablet Commonly known as: HYDRODIURIL Take 25 mg by mouth daily.   loperamide 2 MG tablet Commonly known as: IMODIUM A-D Take 2 mg by mouth daily as needed for diarrhea or loose stools.   oxyCODONE 5 MG immediate release tablet Commonly known as: Oxy IR/ROXICODONE Take 1 tablet (5 mg total) by mouth every 6 (six) hours as needed for  moderate pain.   propranolol 80 MG tablet Commonly known as: INDERAL Take 160 mg by mouth daily.   rosuvastatin 5 MG tablet Commonly known as: CRESTOR Take 1 tablet by mouth twice weekly as tolerated What changed:   how much to take  how to take this  when to take this   warfarin 5 MG tablet Commonly known as: COUMADIN Take as directed. If you are unsure how to take this medication, talk to your nurse or doctor. Original instructions: Take 1 tablet (5 mg total) by mouth daily. What changed:   how much to take  when to take this        [facility] Discharge Instructions Carotid Endarterectomy (CEA)  Please refer to the following instructions for your post-procedure care. Your surgeon or physician assistant will discuss any changes with you.  Activity  You are encouraged to walk as much as you can. You can slowly return to normal activities but must avoid strenuous activity and heavy lifting until your doctor tell you it's OK. Avoid activities such as vacuuming or swinging a golf club. You can drive after one week if you are comfortable and you are no longer taking prescription pain medications. It is normal to feel tired for serval weeks after your surgery. It is also normal to have difficulty with sleep habits, eating, and bowel movements after surgery. These will go away with time.  Bathing/Showering  You may shower after you come home. Do not soak in a bathtub, hot tub, or swim until the incision heals completely.  Incision Care  Shower every day. Clean your incision with mild soap and water. Pat the area dry with a clean towel. You do not need a bandage unless otherwise instructed. Do not apply any ointments or creams to your incision. You may have skin glue on your incision. Do not peel it off. It will come off on its own in about one week. Your incision may feel thickened and raised for several weeks after your surgery. This is normal  and the skin will soften over time. For Men Only: It's OK to shave around the incision but do not shave the incision itself for 2 weeks. It is common to have numbness under your chin that could last for several months.  Diet  Resume your normal diet. There are no special food restrictions following this procedure. A low fat/low cholesterol diet is recommended for all patients with vascular disease. In order to heal from your surgery, it is CRITICAL to get adequate nutrition. Your body requires vitamins, minerals, and protein. Vegetables are the best source of vitamins and minerals. Vegetables also provide the perfect balance of protein. Processed food has little nutritional value, so try to avoid this.  Medications  Resume taking all of your medications unless your doctor or physician assistant tells you not to.  If your incision is causing pain, you may take over-the- counter pain relievers such as acetaminophen (Tylenol). If you were prescribed a stronger pain medication, please be aware these medications can cause nausea and constipation.  Prevent nausea by taking the medication with a snack or meal. Avoid constipation by drinking plenty of fluids and eating foods with a high amount of fiber, such as fruits, vegetables, and grains.  Do not take Tylenol if you are taking prescription pain medications.  Follow Up  Our office will schedule a follow up appointment 2-3 weeks following discharge.  Please call us immediately for any of the following conditions  . Increased pain, redness, drainage (pus) from your incision site. . Fever of 101 degrees or higher. . If you should develop stroke (slurred speech, difficulty swallowing, weakness on one side of your body, loss of vision) you should call 911 and go to the nearest emergency room. .  Reduce your risk of vascular disease:  . Stop smoking. If you would like help call [facility] at 1-800-QUIT-NOW 939-797-7886) or [place] at  301-008-3676. . Manage your cholesterol . Maintain a desired weight . Control your diabetes . Keep your blood pressure down .  If you have any questions, please call the office at [phone].  Prescriptions given: 1.   Roxicet #15 No Refill   Disposition: Home  Patient's condition: is Excellent  Follow up: 1. Dr. Trula Slade in 2 weeks.   Risa Grill, PA-C [facility] [phone]   --- For [facility] use ---   Modified Rankin score at D/C (0-6): 0  IV medication needed for:  1. Hypertension: No 2. Hypotension: No  Post-op Complications: No  1. Post-op CVA or TIA: No  If yes: Event classification (right eye, left eye, right cortical, left cortical, verterobasilar, other):   If yes: Timing of event (intra-op, <6 hrs post-op, >=6 hrs post-op, unknown):   2. CN injury: No  If yes: CN n/a injuried   3. Myocardial infarction: No  If yes: Dx by (EKG or clinical, Troponin):   4.  CHF: No  5.  Dysrhythmia (new): No  6. Wound infection: No  7. Reperfusion symptoms: No  8. Return to OR: No  If yes: return to OR for (bleeding, neurologic, other CEA incision, other):   Discharge medications: Statin use:  Yes ASA use:  No   Beta blocker use:  Yes ACE-Inhibitor use:  No  ARB use:  No CCB use: Yes P2Y12 Antagonist use: No, [ ]  Plavix, [ ]  Plasugrel, [ ]  Ticlopinine, [ ]  Ticagrelor, [ ]  Other, [ ]  No for medical reason, [ ]  Non-compliant, [x ] Not-indicated Anti-coagulant use:  Yes, [x ] Warfarin, [ ]  Rivaroxaban, [ ]  Dabigatran,

## 2019-10-09 ENCOUNTER — Telehealth: Payer: Self-pay | Admitting: Cardiovascular Disease

## 2019-10-09 NOTE — Telephone Encounter (Signed)
Patient had surgery this past Friday and was told by the Doctor that she must be checked (INR) this Wednesday

## 2019-10-09 NOTE — Telephone Encounter (Signed)
Called patient.  She had Lt CEA on 10/06/19.  Restarted warfarin on 10/08/19.  Took 2.5mg .  Was told to come fore INR check on Wed 3/31.  Told to take warfarin 5mg  tonight and tomorrow night (3/29 & 3/30) and come for INR check on 3/31.  Pt verbalized understanding.

## 2019-10-10 ENCOUNTER — Encounter: Payer: Self-pay | Admitting: *Deleted

## 2019-10-10 ENCOUNTER — Other Ambulatory Visit: Payer: Self-pay | Admitting: *Deleted

## 2019-10-10 NOTE — Patient Outreach (Signed)
[facility]) Care Management  10/10/2019  Savannah Kirk 01/10/1934 [id number]   RED ON EMMI ALERT - General Discharge Day # 1 Date: 3/29 Red Alert Reason: No follow up scheduled, Don't know who to call with changes in condition   Outreach attempt #1, successful.    Referral received from care management assistant as member was admitted to hospital for elective carotid endarterectomy, discharged on 3/27.  Per chart, she has history of HTN, A-fib, and HLD.  Call placed to member, identity verified.  This care manager introduced self and stated purpose of call, [facility] care management services explained.  She is initially reluctant to provide information, but as program explained more she begins to open up.  Report she lives alone but her daughter is in the area to help with needs.  Daughter stayed with her the past few nights since discharge.  She does have follow up appointment scheduled with surgeon on 4/19.  State she was told she could drive after a week and will provide her own transportation.  She also has appointment with coumadin clinic tomorrow, daughter will take her.    Expresses concern regarding restarting Coumadin, state it has made her "sick."  Unable to specify what "sick" means, just she does not feel her best since restarting.  State she has tried taking Eliquis but it had an impact on her kidneys as well as there was concern for cost.  There is also a concern for cost if she switched to Xarelto.    Also state since she has been home she have developed a right side facial droop.  Surgery was on the left side, state there is some swelling on the surgical side, she wonder if this causing the droop.  Discussed risk of stroke with being off Coumadin pre-op as well as risk from the surgery.  Advised to go to ED/Urgent care center, she does not want to.  Discussed calling MD office to report the droop, state she will just wait until tomorrow when she is already in the office  for her INR check.  Advised again to have her condition evaluated but also respect member's wishes to wait. She denies any urgent concerns, advised to contact this care manager with questions.   Plan: RN CM will follow up within the next week.  If remain stable will close case.

## 2019-10-11 ENCOUNTER — Other Ambulatory Visit: Payer: Self-pay

## 2019-10-11 ENCOUNTER — Ambulatory Visit (INDEPENDENT_AMBULATORY_CARE_PROVIDER_SITE_OTHER): Payer: Medicare Other | Admitting: *Deleted

## 2019-10-11 DIAGNOSIS — I4819 Other persistent atrial fibrillation: Secondary | ICD-10-CM | POA: Diagnosis not present

## 2019-10-11 DIAGNOSIS — Z5181 Encounter for therapeutic drug level monitoring: Secondary | ICD-10-CM

## 2019-10-11 LAB — POCT INR: INR: 1.1 — AB (ref 2.0–3.0)

## 2019-10-11 NOTE — Patient Instructions (Signed)
Take warfarin 1 tablet x 3 days then resume 1/2 tablet daily except 1 tablet on Mondays and Thursdays Recheck in 1 wk

## 2019-10-16 ENCOUNTER — Encounter: Payer: Medicare Other | Admitting: Surgery

## 2019-10-17 ENCOUNTER — Other Ambulatory Visit: Payer: Self-pay | Admitting: *Deleted

## 2019-10-17 NOTE — Patient Outreach (Signed)
[facility]) Care Management  10/17/2019  Savannah Kirk 1934-03-22 [id number]   RED ON EMMI ALERT - General Discharge Day # 4 Date: 4/1 Red Alert Reason: Other problems or questions   Outreach attempt #1, successful, identity verified.  She is familiar with program as she also triggered red on EMMI dashboard last week.  She denies any problems or questions at this time, state the facial droop she was experiencing last week is now better.  She report is still having low energy due to taking the warfarin but will continue to take it as she knows it is beneficial to her health.  She has another visit with the coumadin clinic this week and post op visit on 4/19.  Denies any ongoing concerns at this time.     Plan: RN CM will close case as no further needs identified.  Valente David, [place], MSN [place] [phone]

## 2019-10-19 ENCOUNTER — Ambulatory Visit (INDEPENDENT_AMBULATORY_CARE_PROVIDER_SITE_OTHER): Payer: Medicare Other | Admitting: *Deleted

## 2019-10-19 ENCOUNTER — Other Ambulatory Visit: Payer: Self-pay

## 2019-10-19 DIAGNOSIS — I4819 Other persistent atrial fibrillation: Secondary | ICD-10-CM

## 2019-10-19 DIAGNOSIS — Z5181 Encounter for therapeutic drug level monitoring: Secondary | ICD-10-CM | POA: Diagnosis not present

## 2019-10-19 LAB — POCT INR: INR: 2.7 (ref 2.0–3.0)

## 2019-10-19 NOTE — Patient Instructions (Signed)
Continue warfarin 1/2 tablet daily except 1 tablet on Mondays and Thursdays Recheck in 3 wks

## 2019-10-30 ENCOUNTER — Ambulatory Visit (INDEPENDENT_AMBULATORY_CARE_PROVIDER_SITE_OTHER): Payer: Self-pay | Admitting: Surgery

## 2019-10-30 ENCOUNTER — Other Ambulatory Visit: Payer: Self-pay

## 2019-10-30 ENCOUNTER — Encounter: Payer: Self-pay | Admitting: Surgery

## 2019-10-30 VITALS — BP 146/83 | HR 75 | Temp 97.3°F | Resp 20 | Ht 64.0 in | Wt 172.0 lb

## 2019-10-30 DIAGNOSIS — I6523 Occlusion and stenosis of bilateral carotid arteries: Secondary | ICD-10-CM

## 2019-10-30 NOTE — Progress Notes (Signed)
Patient [name] MRN: [id number] DOB: 05-01-1934 Sex: female  REASON FOR VISIT:     post op  HISTORY OF PRESENT ILLNESS:   Savannah Kirk is a 86 y.o. female who was found to have a bruit on physical exam which led to an ultrasound showing a percent left carotid stenosis.  On 10/06/2019 she underwent left carotid endarterectomy.  Intraoperative findings included an 80% stenosis at the bifurcation with ulceration.  Her postoperative course was uncomplicated.  Patient has a history of peripheral vascular disease having undergone right renal artery stenting in 2005.  She is medically managed for hypertension.  She suffers from hypercholesterolemia but is not taking a statin due to intolerance.  She does have a history of spinal stenosis and some right lower extremity discomfort.  She is on Coumadin for A. fib which is rate controlled.  She is a former smoker.  CURRENT MEDICATIONS:    Current Outpatient Medications  Medication Sig Dispense Refill  . acetaminophen (TYLENOL) 500 MG tablet Take 500-1,000 mg by mouth every 6 (six) hours as needed (for pain.).    Marland Kitchen diazepam (VALIUM) 5 MG tablet Take 5 mg by mouth at bedtime as needed for sedation.     Marland Kitchen diltiazem (CARDIZEM CD) 120 MG 24 hr capsule Take 1 capsule (120 mg total) by mouth daily. (Patient taking differently: Take 120 mg by mouth at bedtime. ) 90 capsule 3  . doxazosin (CARDURA) 2 MG tablet Take 2 mg by mouth daily after lunch.     . hydrochlorothiazide (HYDRODIURIL) 25 MG tablet Take 25 mg by mouth daily.     Marland Kitchen loperamide (IMODIUM A-D) 2 MG tablet Take 2 mg by mouth daily as needed for diarrhea or loose stools.    . Omega-3 Fatty Acids (FISH OIL) 1000 MG CAPS Take 1,000 mg by mouth daily after lunch.     . propranolol (INDERAL) 80 MG tablet Take 160 mg by mouth daily.    . rosuvastatin (CRESTOR) 5 MG tablet Take 1 tablet by mouth twice weekly as tolerated (Patient taking differently: Take 5 mg by  mouth 2 (two) times a week. Take 1 tablet by mouth twice weekly as tolerated) 30 tablet 3  . warfarin (COUMADIN) 5 MG tablet Take 1 tablet (5 mg total) by mouth daily. (Patient taking differently: Take 2.5 mg by mouth at bedtime. ) 90 tablet 3  . oxyCODONE (OXY IR/ROXICODONE) 5 MG immediate release tablet Take 1 tablet (5 mg total) by mouth every 6 (six) hours as needed for moderate pain. (Patient not taking: Reported on 10/30/2019) 15 tablet 0   No current facility-administered medications for this visit.    REVIEW OF SYSTEMS:   [X]  denotes positive finding, [ ]  denotes negative finding Cardiac  Comments:  Chest pain or chest pressure:    Shortness of breath upon exertion:    Short of breath when lying flat:    Irregular heart rhythm:    Constitutional    Fever or chills:      PHYSICAL EXAM:   Vitals:   10/30/19 1127 10/30/19 1132  BP: (!) 154/80 (!) 146/83  Pulse: 75   Resp: 20   Temp: (!) 97.3 F (36.3 C)   SpO2: 97%   Weight: 172 lb (78 kg)   Height: 5\' 4"  (1.626 m)     GENERAL: The patient is a well-nourished female, in no acute distress. The vital signs are documented above. CARDIOVASCULAR: There is a regular rate and rhythm. PULMONARY: Non-labored  respirations Her incision has healed nicely.  She is neurologically intact.  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  She is doing very well in her recovery.  She does have a marginal mandibular neuropraxia which I have encouraged her should resolve with time.  She will follow-up in 6 months with repeat carotid duplex.  Leia Alf, MD, FACS [facility] [phone] Pager [phone]

## 2019-11-01 ENCOUNTER — Other Ambulatory Visit: Payer: Self-pay | Admitting: *Deleted

## 2019-11-01 DIAGNOSIS — I6523 Occlusion and stenosis of bilateral carotid arteries: Secondary | ICD-10-CM

## 2019-11-09 ENCOUNTER — Ambulatory Visit (INDEPENDENT_AMBULATORY_CARE_PROVIDER_SITE_OTHER): Payer: Medicare Other

## 2019-11-09 ENCOUNTER — Other Ambulatory Visit: Payer: Self-pay

## 2019-11-09 DIAGNOSIS — I482 Chronic atrial fibrillation, unspecified: Secondary | ICD-10-CM

## 2019-11-09 DIAGNOSIS — Z5181 Encounter for therapeutic drug level monitoring: Secondary | ICD-10-CM | POA: Diagnosis not present

## 2019-11-09 DIAGNOSIS — I4819 Other persistent atrial fibrillation: Secondary | ICD-10-CM | POA: Diagnosis not present

## 2019-11-09 LAB — POCT INR: INR: 3.4 — AB (ref 2.0–3.0)

## 2019-11-09 NOTE — Patient Instructions (Signed)
Description   Skip today's dosage of Warfarin, then resume same dosage of warfarin 1/2 tablet daily except 1 tablet on Mondays and Thursdays Recheck in 3 wks

## 2019-11-30 ENCOUNTER — Ambulatory Visit (INDEPENDENT_AMBULATORY_CARE_PROVIDER_SITE_OTHER): Payer: Medicare Other | Admitting: Pharmacist

## 2019-11-30 ENCOUNTER — Other Ambulatory Visit: Payer: Self-pay

## 2019-11-30 DIAGNOSIS — I4819 Other persistent atrial fibrillation: Secondary | ICD-10-CM

## 2019-11-30 DIAGNOSIS — Z5181 Encounter for therapeutic drug level monitoring: Secondary | ICD-10-CM

## 2019-11-30 DIAGNOSIS — I482 Chronic atrial fibrillation, unspecified: Secondary | ICD-10-CM

## 2019-11-30 LAB — POCT INR: INR: 2.7 (ref 2.0–3.0)

## 2019-11-30 NOTE — Patient Instructions (Signed)
Description   Continue taking warfarin 1/2 tablet daily except 1 tablet on Mondays and Thursdays Recheck in 4 wks

## 2019-12-06 ENCOUNTER — Encounter: Payer: Self-pay | Admitting: Cardiovascular Disease

## 2019-12-06 ENCOUNTER — Other Ambulatory Visit: Payer: Self-pay

## 2019-12-06 ENCOUNTER — Ambulatory Visit: Payer: Medicare Other | Admitting: Cardiovascular Disease

## 2019-12-06 DIAGNOSIS — E782 Mixed hyperlipidemia: Secondary | ICD-10-CM

## 2019-12-06 DIAGNOSIS — I6522 Occlusion and stenosis of left carotid artery: Secondary | ICD-10-CM | POA: Diagnosis not present

## 2019-12-06 DIAGNOSIS — I10 Essential (primary) hypertension: Secondary | ICD-10-CM

## 2019-12-06 DIAGNOSIS — I482 Chronic atrial fibrillation, unspecified: Secondary | ICD-10-CM

## 2019-12-06 NOTE — Progress Notes (Signed)
12/06/2019 [place]   Jan 06, 1934  [id number]  Primary Physician Asencion Noble, MD Primary Cardiologist: Lorretta Harp MD FACP, [place], [place], [place]  HPI:  Savannah Kirk is a 86 y.o.  mildly overweight, divorced Caucasian female, mother of 3 who I last saw in the office  08/30/2019.Marland Kitchen She has a history of PAD status post right renal artery PTA and stenting by myself July 06, 2004. We have been following this by duplex ultrasound which was last done August 03, 2011, that was stable. Her other problems include hypertension and hyperlipidemia. Not on a statin drug because of statin intolerance. She also has right lower extremity discomfort diagnosed by Dr. Christella Noa and spinal stenosis with radicular pain improved with steroid injection. Since I saw her a year ago she's remained otherwise asymptomatic. Her most recent l renal Doppler study performed 01/03/14 revealed a widely patent right renal artery stent. Dr. Willey Blade follows her lipid profile.   When I saw her a year ago she was in A. fib. I referred her to the A. fib clinic. She was originally started on Eliquis which was transitioned to warfarin because of side effects. She is rate controlled. She was complaining of dyspnea I last saw her and she had negative Myoview in February of last year and a normal 2D echo in January of last year.  Because of an auscultated bruit I performed carotid Doppler study on her 08/17/2019 revealing a high-grade left ICA stenosis.  Based on this I I referred her to Dr. Trula Slade for elective left carotid endarterectomy.  Because of complaints of atypical chest pain and preoperative evaluation she underwent Myoview stress testing 09/12/2019 which was low risk and nonischemic.  She underwent uncomplicated endarterectomy on 10/06/2019 and is recuperated nicely.  Her only major complaint is of chronic fatigue and shortness of breath.   Current Meds  Medication Sig  . acetaminophen (TYLENOL) 500 MG tablet Take  500-1,000 mg by mouth every 6 (six) hours as needed (for pain.).  Marland Kitchen diazepam (VALIUM) 5 MG tablet Take 5 mg by mouth at bedtime as needed for sedation.   Marland Kitchen diltiazem (CARDIZEM CD) 120 MG 24 hr capsule Take 1 capsule (120 mg total) by mouth daily. (Patient taking differently: Take 120 mg by mouth at bedtime. )  . doxazosin (CARDURA) 2 MG tablet Take 2 mg by mouth daily after lunch.   . hydrochlorothiazide (HYDRODIURIL) 25 MG tablet Take 25 mg by mouth daily.   Marland Kitchen loperamide (IMODIUM A-D) 2 MG tablet Take 2 mg by mouth daily as needed for diarrhea or loose stools.  . Omega-3 Fatty Acids (FISH OIL) 1000 MG CAPS Take 1,000 mg by mouth daily after lunch.   . oxyCODONE (OXY IR/ROXICODONE) 5 MG immediate release tablet Take 1 tablet (5 mg total) by mouth every 6 (six) hours as needed for moderate pain.  Marland Kitchen propranolol (INDERAL) 80 MG tablet Take 160 mg by mouth daily.  . rosuvastatin (CRESTOR) 5 MG tablet Take 1 tablet by mouth twice weekly as tolerated (Patient taking differently: Take 5 mg by mouth 2 (two) times a week. Take 1 tablet by mouth twice weekly as tolerated)  . warfarin (COUMADIN) 5 MG tablet Take 1 tablet (5 mg total) by mouth daily. (Patient taking differently: Take 2.5 mg by mouth at bedtime. )     Allergies  Allergen Reactions  . Aspirin Other (See Comments)    Nose bleeds  . Penicillins Other (See Comments)    Skins comes off  tongue  Did it involve swelling of the face/tongue/throat, SOB, or low BP? No Did it involve sudden or severe rash/hives, skin peeling, or any reaction on the inside of your mouth or nose? No Did you need to seek medical attention at a hospital or doctor's office? Yes When did it last happen?56-32 years old If all above answers are "NO", may proceed with cephalosporin use.     Social History   Socioeconomic History  . Marital status: Married    Spouse name: Not on file  . Number of children: Not on file  . Years of education: Not on file  .  Highest education level: Not on file  Occupational History  . Not on file  Tobacco Use  . Smoking status: Former Smoker    Types: Cigarettes    Quit date: 06/14/1985    Years since quitting: 34.5  . Smokeless tobacco: Never Used  Substance and Sexual Activity  . Alcohol use: No  . Drug use: No  . Sexual activity: Not on file  Other Topics Concern  . Not on file  Social History Narrative  . Not on file   Social Determinants of Health   Financial Resource Strain:   . Difficulty of Paying Living Expenses:   Food Insecurity:   . Worried About Charity fundraiser in the Last Year:   . Arboriculturist in the Last Year:   Transportation Needs:   . Film/video editor (Medical):   Marland Kitchen Lack of Transportation (Non-Medical):   Physical Activity:   . Days of Exercise per Week:   . Minutes of Exercise per Session:   Stress:   . Feeling of Stress :   Social Connections:   . Frequency of Communication with Friends and Family:   . Frequency of Social Gatherings with Friends and Family:   . Attends Religious Services:   . Active Member of Clubs or Organizations:   . Attends Archivist Meetings:   Marland Kitchen Marital Status:   Intimate Partner Violence:   . Fear of Current or Ex-Partner:   . Emotionally Abused:   Marland Kitchen Physically Abused:   . Sexually Abused:      Review of Systems: General: negative for chills, fever, night sweats or weight changes.  Cardiovascular: negative for chest pain, dyspnea on exertion, edema, orthopnea, palpitations, paroxysmal nocturnal dyspnea or shortness of breath Dermatological: negative for rash Respiratory: negative for cough or wheezing Urologic: negative for hematuria Abdominal: negative for nausea, vomiting, diarrhea, bright red blood per rectum, melena, or hematemesis Neurologic: negative for visual changes, syncope, or dizziness All other systems reviewed and are otherwise negative except as noted above.    Blood pressure (!) 142/78, pulse  (!) 101, height 5\' 4"  (1.626 m), weight 168 lb 9.6 oz (76.5 kg).  General appearance: alert and no distress Neck: no adenopathy, no carotid bruit, no JVD, supple, symmetrical, trachea midline and thyroid not enlarged, symmetric, no tenderness/mass/nodules Lungs: clear to auscultation bilaterally Heart: irregularly irregular rhythm Extremities: extremities normal, atraumatic, no cyanosis or edema Pulses: 2+ and symmetric Skin: Skin color, texture, turgor normal. No rashes or lesions Neurologic: Alert and oriented X 3, normal strength and tone. Normal symmetric reflexes. Normal coordination and gait  EKG atrial fibrillation with a ventricular spots of 101 and right bundle branch block.  I personally reviewed this EKG.  ASSESSMENT AND PLAN:   Essential hypertension History of essential hypertension a blood pressure measured today 142/78.  She is on diltiazem, propranolol and  hydrochlorothiazide.  Hyperlipidemia History of hyperlipidemia on statin therapy with lipid profile performed 08/07/2019 revealing a total cholesterol of 222 and HDL of 110 and LDL 120.  Atrial fibrillation, chronic History of chronic A. fib rate controlled on Coumadin anticoagulation.  Carotid artery disease (HCC) History of asymptomatic carotid artery disease status post uncomplicated elective endarterectomy performed by Dr. Trula Slade 10/06/2019.  This was done after she had a low risk Myoview to preoperative clearance her.      Lorretta Harp MD FACP,FACC,FAHA, [facility] 12/06/2019 11:45 AM

## 2019-12-06 NOTE — Assessment & Plan Note (Signed)
History of asymptomatic carotid artery disease status post uncomplicated elective endarterectomy performed by Dr. Trula Slade 10/06/2019.  This was done after she had a low risk Myoview to preoperative clearance her.

## 2019-12-06 NOTE — Assessment & Plan Note (Signed)
History of essential hypertension a blood pressure measured today 142/78.  She is on diltiazem, propranolol and hydrochlorothiazide.

## 2019-12-06 NOTE — Assessment & Plan Note (Signed)
History of hyperlipidemia on statin therapy with lipid profile performed 08/07/2019 revealing a total cholesterol of 222 and HDL of 110 and LDL 120.

## 2019-12-06 NOTE — Patient Instructions (Signed)
Medication Instructions:  No changes *If you need a refill on your cardiac medications before your next appointment, please call your pharmacy*   Lab Work: None ordered If you have labs (blood work) drawn today and your tests are completely normal, you will receive your results only by: Marland Kitchen MyChart Message (if you have MyChart) OR . A paper copy in the mail If you have any lab test that is abnormal or we need to change your treatment, we will call you to review the results.   Testing/Procedures: None ordered   Follow-Up: At [facility], you and your health needs are our priority.  As part of our continuing mission to provide you with exceptional heart care, we have created designated Provider Care Teams.  These Care Teams include your primary Cardiologist (physician) and Advanced Practice Providers (APPs -  Physician Assistants and Nurse Practitioners) who all work together to provide you with the care you need, when you need it.  We recommend signing up for the patient portal called "MyChart".  Sign up information is provided on this After Visit Summary.  MyChart is used to connect with patients for Virtual Visits (Telemedicine).  Patients are able to view lab/test results, encounter notes, upcoming appointments, etc.  Non-urgent messages can be sent to your provider as well.   To learn more about what you can do with MyChart, go to [email or web address].    Your next appointment:   Follow up with an APP in 6 months Follow up with Dr. Gwenlyn Found in 12 motnhs

## 2019-12-06 NOTE — Assessment & Plan Note (Signed)
History of chronic A. fib rate controlled on Coumadin anticoagulation. 

## 2019-12-15 DIAGNOSIS — I482 Chronic atrial fibrillation, unspecified: Secondary | ICD-10-CM | POA: Diagnosis not present

## 2019-12-15 DIAGNOSIS — I10 Essential (primary) hypertension: Secondary | ICD-10-CM | POA: Diagnosis not present

## 2019-12-15 DIAGNOSIS — G4700 Insomnia, unspecified: Secondary | ICD-10-CM | POA: Diagnosis not present

## 2019-12-28 ENCOUNTER — Ambulatory Visit (INDEPENDENT_AMBULATORY_CARE_PROVIDER_SITE_OTHER): Payer: Medicare Other | Admitting: *Deleted

## 2019-12-28 DIAGNOSIS — Z5181 Encounter for therapeutic drug level monitoring: Secondary | ICD-10-CM | POA: Diagnosis not present

## 2019-12-28 DIAGNOSIS — I4819 Other persistent atrial fibrillation: Secondary | ICD-10-CM | POA: Diagnosis not present

## 2019-12-28 LAB — POCT INR: INR: 3 (ref 2.0–3.0)

## 2019-12-28 NOTE — Patient Instructions (Signed)
Continue taking warfarin 1/2 tablet daily except 1 tablet on Mondays and Thursdays Recheck in 4 wks

## 2020-01-05 DIAGNOSIS — H5213 Myopia, bilateral: Secondary | ICD-10-CM | POA: Diagnosis not present

## 2020-01-05 DIAGNOSIS — Z961 Presence of intraocular lens: Secondary | ICD-10-CM | POA: Diagnosis not present

## 2020-01-05 DIAGNOSIS — H524 Presbyopia: Secondary | ICD-10-CM | POA: Diagnosis not present

## 2020-01-05 DIAGNOSIS — H52203 Unspecified astigmatism, bilateral: Secondary | ICD-10-CM | POA: Diagnosis not present

## 2020-01-24 ENCOUNTER — Ambulatory Visit (INDEPENDENT_AMBULATORY_CARE_PROVIDER_SITE_OTHER): Payer: Medicare Other | Admitting: *Deleted

## 2020-01-24 DIAGNOSIS — Z5181 Encounter for therapeutic drug level monitoring: Secondary | ICD-10-CM | POA: Diagnosis not present

## 2020-01-24 DIAGNOSIS — I4819 Other persistent atrial fibrillation: Secondary | ICD-10-CM

## 2020-01-24 LAB — POCT INR: INR: 1 — AB (ref 2.0–3.0)

## 2020-01-24 NOTE — Patient Instructions (Signed)
Pt stopped taking warfarin 10 days ago because she thinks it makes her feel bad.  He have discussed this before but she has decided to stop warfarin x 30 days to see how she feels.  If she feels better she is refusing to start back on it against medical advise.  She is aware of the increased risk of blood clot/stroke but states "I'm willing to take that chance".  She tried DOAC's before and had multiple complaints as well.  "Made my kidneys hurt".  Pt will call back at the end of 30 days to let me know if she is going back on warfarin or staying off of it.

## 2020-04-16 DIAGNOSIS — Z23 Encounter for immunization: Secondary | ICD-10-CM | POA: Diagnosis not present

## 2020-04-25 ENCOUNTER — Telehealth: Payer: Self-pay | Admitting: *Deleted

## 2020-04-25 NOTE — Telephone Encounter (Signed)
Spoke with pt.  States she restarted warfarin 1 week ago.  States she did not feel any better when she was not taking it so decided to restart it.  She started back on previous dose of 2.5mg  daily except 5mg  on Mondays and Thursdays.  Appt made to recheck INR on 05/02/20.

## 2020-04-25 NOTE — Telephone Encounter (Signed)
Patient has gone back on Coumdin. Not sure when she is suppose to have appointment.

## 2020-05-02 ENCOUNTER — Other Ambulatory Visit: Payer: Self-pay

## 2020-05-02 ENCOUNTER — Ambulatory Visit (INDEPENDENT_AMBULATORY_CARE_PROVIDER_SITE_OTHER): Payer: Medicare Other | Admitting: Pharmacist

## 2020-05-02 DIAGNOSIS — Z5181 Encounter for therapeutic drug level monitoring: Secondary | ICD-10-CM | POA: Diagnosis not present

## 2020-05-02 DIAGNOSIS — I482 Chronic atrial fibrillation, unspecified: Secondary | ICD-10-CM | POA: Diagnosis not present

## 2020-05-02 LAB — POCT INR: INR: 2.4 (ref 2.0–3.0)

## 2020-05-02 NOTE — Patient Instructions (Signed)
Description   Continue 1 tablet on Mondays and Thursdays and 1/2 tablet the rest of the week.  Recheck in 4 weeks.

## 2020-05-30 ENCOUNTER — Ambulatory Visit (INDEPENDENT_AMBULATORY_CARE_PROVIDER_SITE_OTHER): Payer: Medicare Other | Admitting: *Deleted

## 2020-05-30 DIAGNOSIS — Z5181 Encounter for therapeutic drug level monitoring: Secondary | ICD-10-CM

## 2020-05-30 DIAGNOSIS — I482 Chronic atrial fibrillation, unspecified: Secondary | ICD-10-CM | POA: Diagnosis not present

## 2020-05-30 LAB — POCT INR: INR: 2.5 (ref 2.0–3.0)

## 2020-05-30 NOTE — Patient Instructions (Signed)
Continue 1 tablet on Mondays and Thursdays and 1/2 tablet the rest of the week.  Recheck in 4 weeks.

## 2020-06-12 ENCOUNTER — Ambulatory Visit: Payer: Medicare Other | Admitting: Cardiovascular Disease

## 2020-06-12 ENCOUNTER — Other Ambulatory Visit: Payer: Self-pay

## 2020-06-12 ENCOUNTER — Encounter: Payer: Self-pay | Admitting: Cardiovascular Disease

## 2020-06-12 VITALS — BP 150/77 | HR 76 | Temp 96.8°F | Ht 63.0 in | Wt 170.2 lb

## 2020-06-12 DIAGNOSIS — I6522 Occlusion and stenosis of left carotid artery: Secondary | ICD-10-CM | POA: Diagnosis not present

## 2020-06-12 DIAGNOSIS — I482 Chronic atrial fibrillation, unspecified: Secondary | ICD-10-CM | POA: Diagnosis not present

## 2020-06-12 DIAGNOSIS — I4821 Permanent atrial fibrillation: Secondary | ICD-10-CM

## 2020-06-12 DIAGNOSIS — E782 Mixed hyperlipidemia: Secondary | ICD-10-CM | POA: Diagnosis not present

## 2020-06-12 DIAGNOSIS — I10 Essential (primary) hypertension: Secondary | ICD-10-CM

## 2020-06-12 NOTE — Assessment & Plan Note (Signed)
History of carotid artery disease with known high-grade left ICA stenosis by duplex ultrasound which I performed 08/17/2019.  She underwent uncomplicated elective left carotid endarterectomy by Dr. Trula Slade 10/06/2019 after Myoview stress test performed on 09/12/2019 for preoperative clearance was low risk and nonischemic.  He will follow-up for duplex ultrasounds in the future.

## 2020-06-12 NOTE — Assessment & Plan Note (Signed)
History of chronic atrial fibrillation rate controlled on Coumadin anticoagulation

## 2020-06-12 NOTE — Assessment & Plan Note (Signed)
History of hyperlipidemia on Crestor 5 mg 2 times a week.  Most recent lipid profile performed 08/07/2019 revealed total cholesterol 222, with an LDL of 120 and HDL of 110.  I am going to repeat a fasting lipid liver profile today.

## 2020-06-12 NOTE — Patient Instructions (Signed)
Medication Instructions:  Your physician recommends that you continue on your current medications as directed. Please refer to the Current Medication list given to you today.  *If you need a refill on your cardiac medications before your next appointment, please call your pharmacy*   Lab Work: Your physician recommends that you have labs done today: lipid/ liver profile  If you have labs (blood work) drawn today and your tests are completely normal, you will receive your results only by: Marland Kitchen MyChart Message (if you have MyChart) OR . A paper copy in the mail If you have any lab test that is abnormal or we need to change your treatment, we will call you to review the results.  Follow-Up: At [facility], you and your health needs are our priority.  As part of our continuing mission to provide you with exceptional heart care, we have created designated Provider Care Teams.  These Care Teams include your primary Cardiologist (physician) and Advanced Practice Providers (APPs -  Physician Assistants and Nurse Practitioners) who all work together to provide you with the care you need, when you need it.  We recommend signing up for the patient portal called "MyChart".  Sign up information is provided on this After Visit Summary.  MyChart is used to connect with patients for Virtual Visits (Telemedicine).  Patients are able to view lab/test results, encounter notes, upcoming appointments, etc.  Non-urgent messages can be sent to your provider as well.   To learn more about what you can do with MyChart, go to [email or web address].    Your next appointment:   12 month(s)  The format for your next appointment:   In Person  Provider:   Quay Burow, MD

## 2020-06-12 NOTE — Progress Notes (Signed)
06/12/2020 Savannah Kirk   1934/05/20  [id number]  Primary Physician Asencion Noble, MD Primary Cardiologist: Lorretta Harp MD FACP, [place], [place], [place]  HPI:  Savannah Kirk is a 86 y.o.   mildly overweight, divorced Caucasian female, mother of 3 who I last saw in the office 12/06/2019.Marland Kitchen She has a history of PAD status post right renal artery PTA and stenting by myself July 06, 2004. We have been following this by duplex ultrasound which was last done August 03, 2011, that was stable. Her other problems include hypertension and hyperlipidemia. Not on a statin drug because of statin intolerance. She also has right lower extremity discomfort diagnosed by Dr. Christella Noa and spinal stenosis with radicular pain improved with steroid injection. Since I saw her a year ago she's remained otherwise asymptomatic. Her most recent l renal Doppler study performed 01/03/14 revealed a widely patent right renal artery stent. Dr. Willey Blade follows her lipid profile.   When I saw her a year ago she was in A. fib. I referred her to the A. fib clinic. She was originally started on Eliquis which was transitioned to warfarin because of side effects. She is rate controlled. She was complaining of dyspnea I last saw her and she had negative Myoview in February of last year and a normal 2D echo in January of last year.  Because of an auscultated bruit I performed carotid Doppler study on her 08/17/2019 revealing a high-grade left ICA stenosis. Based on this I I referred her to Dr. Trula Slade for elective left carotid endarterectomy.  Because of complaints of atypical chest pain and preoperative evaluation she underwent Myoview stress testing 09/12/2019 which was low risk and nonischemic.  She underwent uncomplicated endarterectomy on 10/06/2019 and is recuperated nicely.  Her only major complaint is of chronic fatigue and shortness of breath.  Since I saw her 6 months ago she is recuperated from her left carotid endarterectomy.   Her scar is healed nicely and is not visible.  She is chronically short of breath but denies chest pain.  She continues on Coumadin anticoagulation.   Current Meds  Medication Sig  . acetaminophen (TYLENOL) 500 MG tablet Take 500-1,000 mg by mouth every 6 (six) hours as needed (for pain.).  Marland Kitchen diazepam (VALIUM) 5 MG tablet Take 5 mg by mouth at bedtime as needed for sedation.   Marland Kitchen diltiazem (CARDIZEM CD) 120 MG 24 hr capsule Take 1 capsule (120 mg total) by mouth daily. (Patient taking differently: Take 120 mg by mouth at bedtime. )  . doxazosin (CARDURA) 2 MG tablet Take 2 mg by mouth daily after lunch.   . hydrochlorothiazide (HYDRODIURIL) 25 MG tablet Take 25 mg by mouth daily.   Marland Kitchen loperamide (IMODIUM A-D) 2 MG tablet Take 2 mg by mouth daily as needed for diarrhea or loose stools.  . Omega-3 Fatty Acids (FISH OIL) 1000 MG CAPS Take 1,000 mg by mouth daily after lunch.   . propranolol (INDERAL) 80 MG tablet Take 160 mg by mouth daily.  . rosuvastatin (CRESTOR) 5 MG tablet Take 1 tablet by mouth twice weekly as tolerated (Patient taking differently: Take 5 mg by mouth 2 (two) times a week. Take 1 tablet by mouth twice weekly as tolerated)  . warfarin (COUMADIN) 5 MG tablet Take 1 tablet (5 mg total) by mouth daily. (Patient taking differently: Take 2.5 mg by mouth at bedtime. )  . [DISCONTINUED] oxyCODONE (OXY IR/ROXICODONE) 5 MG immediate release tablet Take 1 tablet (5 mg  total) by mouth every 6 (six) hours as needed for moderate pain.     Allergies  Allergen Reactions  . Aspirin Other (See Comments)    Nose bleeds  . Penicillins Other (See Comments)    Skins comes off tongue  Did it involve swelling of the face/tongue/throat, SOB, or low BP? No Did it involve sudden or severe rash/hives, skin peeling, or any reaction on the inside of your mouth or nose? No Did you need to seek medical attention at a hospital or doctor's office? Yes When did it last happen?66-61 years old If all  above answers are "NO", may proceed with cephalosporin use.     Social History   Socioeconomic History  . Marital status: Married    Spouse name: Not on file  . Number of children: Not on file  . Years of education: Not on file  . Highest education level: Not on file  Occupational History  . Not on file  Tobacco Use  . Smoking status: Former Smoker    Types: Cigarettes    Quit date: 06/14/1985    Years since quitting: 35.0  . Smokeless tobacco: Never Used  Vaping Use  . Vaping Use: Never used  Substance and Sexual Activity  . Alcohol use: No  . Drug use: No  . Sexual activity: Not on file  Other Topics Concern  . Not on file  Social History Narrative  . Not on file   Social Determinants of Health   Financial Resource Strain:   . Difficulty of Paying Living Expenses: Not on file  Food Insecurity:   . Worried About Charity fundraiser in the Last Year: Not on file  . Ran Out of Food in the Last Year: Not on file  Transportation Needs:   . Lack of Transportation (Medical): Not on file  . Lack of Transportation (Non-Medical): Not on file  Physical Activity:   . Days of Exercise per Week: Not on file  . Minutes of Exercise per Session: Not on file  Stress:   . Feeling of Stress : Not on file  Social Connections:   . Frequency of Communication with Friends and Family: Not on file  . Frequency of Social Gatherings with Friends and Family: Not on file  . Attends Religious Services: Not on file  . Active Member of Clubs or Organizations: Not on file  . Attends Archivist Meetings: Not on file  . Marital Status: Not on file  Intimate Partner Violence:   . Fear of Current or Ex-Partner: Not on file  . Emotionally Abused: Not on file  . Physically Abused: Not on file  . Sexually Abused: Not on file     Review of Systems: General: negative for chills, fever, night sweats or weight changes.  Cardiovascular: negative for chest pain, dyspnea on exertion, edema,  orthopnea, palpitations, paroxysmal nocturnal dyspnea or shortness of breath Dermatological: negative for rash Respiratory: negative for cough or wheezing Urologic: negative for hematuria Abdominal: negative for nausea, vomiting, diarrhea, bright red blood per rectum, melena, or hematemesis Neurologic: negative for visual changes, syncope, or dizziness All other systems reviewed and are otherwise negative except as noted above.    Blood pressure (!) 150/77, pulse 76, temperature (!) 96.8 F (36 C), height 5\' 3"  (1.6 m), weight 170 lb 3.2 oz (77.2 kg), SpO2 97 %.  General appearance: alert and no distress Neck: no adenopathy, no carotid bruit, no JVD, supple, symmetrical, trachea midline and thyroid not enlarged, symmetric,  no tenderness/mass/nodules Lungs: clear to auscultation bilaterally Heart: irregularly irregular rhythm Extremities: extremities normal, atraumatic, no cyanosis or edema Pulses: 2+ and symmetric Skin: Skin color, texture, turgor normal. No rashes or lesions Neurologic: Alert and oriented X 3, normal strength and tone. Normal symmetric reflexes. Normal coordination and gait  EKG atrial fibrillation with a ventricular sponsor 76, right bundle branch block.  I personally reviewed this EKG.  ASSESSMENT AND PLAN:   Renal artery stenosis History of renal artery stenosis status post right renal artery PTA and stenting by myself 07/06/2004.  He was read recent renal Doppler study performed 08/17/2019 revealed this to be widely patent.  Essential hypertension History of essential hypertension a blood pressure measured today 150/77.  She is on diltiazem, propranolol and hydrochlorothiazide.  Hyperlipidemia History of hyperlipidemia on Crestor 5 mg 2 times a week.  Most recent lipid profile performed 08/07/2019 revealed total cholesterol 222, with an LDL of 120 and HDL of 110.  I am going to repeat a fasting lipid liver profile today.  Atrial fibrillation, chronic History of  chronic atrial fibrillation rate controlled on Coumadin anticoagulation  Carotid artery disease (HCC) History of carotid artery disease with known high-grade left ICA stenosis by duplex ultrasound which I performed 08/17/2019.  She underwent uncomplicated elective left carotid endarterectomy by Dr. Trula Slade 10/06/2019 after Myoview stress test performed on 09/12/2019 for preoperative clearance was low risk and nonischemic.  He will follow-up for duplex ultrasounds in the future.      Lorretta Harp MD FACP,FACC,FAHA, [facility] 06/12/2020 11:18 AM

## 2020-06-12 NOTE — Assessment & Plan Note (Signed)
History of essential hypertension a blood pressure measured today 150/77.  She is on diltiazem, propranolol and hydrochlorothiazide.

## 2020-06-12 NOTE — Assessment & Plan Note (Signed)
History of renal artery stenosis status post right renal artery PTA and stenting by myself 07/06/2004.  He was read recent renal Doppler study performed 08/17/2019 revealed this to be widely patent.

## 2020-06-13 LAB — LIPID PANEL
Chol/HDL Ratio: 2.6 ratio (ref 0.0–4.4)
Cholesterol, Total: 179 mg/dL (ref 100–199)
HDL: 68 mg/dL (ref >39)
LDL Chol Calc (NIH): 90 mg/dL (ref 0–99)
Triglycerides: 118 mg/dL (ref 0–149)
VLDL Cholesterol Cal: 21 mg/dL (ref 5–40)

## 2020-06-13 LAB — HEPATIC FUNCTION PANEL
ALT: 12 IU/L (ref 0–32)
AST: 14 IU/L (ref 0–40)
Albumin: 4.4 g/dL (ref 3.6–4.6)
Alkaline Phosphatase: 93 IU/L (ref 44–121)
Bilirubin Total: 0.7 mg/dL (ref 0.0–1.2)
Bilirubin, Direct: 0.22 mg/dL (ref 0.00–0.40)
Total Protein: 6.9 g/dL (ref 6.0–8.5)

## 2020-06-24 ENCOUNTER — Telehealth: Payer: Self-pay

## 2020-06-24 NOTE — Telephone Encounter (Signed)
Called pt to discuss increasing rosuvastatin (crestor).  Most recent LDL was 90, per Dr. Gwenlyn Found increase crestor from twice a week to once daily.  Pt informs me that she can no longer take crestor due to the pain and craps that she experiences in her legs.  Pt states she stopped taking crestor last Thursday (06/20/20).  Will refer to Dr. Gwenlyn Found to see if he would like to try another statin.

## 2020-06-24 NOTE — Telephone Encounter (Signed)
-----   Message from Lorretta Harp, MD sent at 06/13/2020 12:11 PM EST ----- LDL 90 on crestor 2  X/wk. Increase to daily and re check 2-3 months. LDL goal <0

## 2020-06-27 ENCOUNTER — Ambulatory Visit (INDEPENDENT_AMBULATORY_CARE_PROVIDER_SITE_OTHER): Payer: Medicare Other | Admitting: *Deleted

## 2020-06-27 ENCOUNTER — Other Ambulatory Visit: Payer: Self-pay

## 2020-06-27 DIAGNOSIS — I482 Chronic atrial fibrillation, unspecified: Secondary | ICD-10-CM | POA: Diagnosis not present

## 2020-06-27 DIAGNOSIS — Z5181 Encounter for therapeutic drug level monitoring: Secondary | ICD-10-CM

## 2020-06-27 LAB — POCT INR: INR: 3.4 — AB (ref 2.0–3.0)

## 2020-06-27 NOTE — Patient Instructions (Signed)
Hold warfarin tomorrow then resume 1 tablet on Mondays and Thursdays and 1/2 tablet the rest of the week.  Recheck in 3.5 weeks.

## 2020-07-11 ENCOUNTER — Telehealth: Payer: Self-pay

## 2020-07-11 DIAGNOSIS — E782 Mixed hyperlipidemia: Secondary | ICD-10-CM

## 2020-07-11 NOTE — Telephone Encounter (Signed)
Called pt to discuss referral to advanced lipid clinic. Pt slightly hesitant to pursue, she states that she just can't seem to tolerate any statin drugs.  Advised pt that there maybe other options for treating cholesterol to seek out with Dr. Rennis Golden. Pt states that we will give it a try. Referral order placed.

## 2020-07-11 NOTE — Telephone Encounter (Signed)
-----   Message from Jonathan J Berry, MD sent at 06/13/2020 12:11 PM EST ----- LDL 90 on crestor 2  X/wk. Increase to daily and re check 2-3 months. LDL goal <0 

## 2020-07-15 ENCOUNTER — Ambulatory Visit: Payer: Medicare Other | Admitting: Surgery

## 2020-07-15 ENCOUNTER — Encounter (HOSPITAL_COMMUNITY): Payer: Medicare Other

## 2020-07-22 ENCOUNTER — Ambulatory Visit (INDEPENDENT_AMBULATORY_CARE_PROVIDER_SITE_OTHER): Payer: Medicare Other | Admitting: *Deleted

## 2020-07-22 DIAGNOSIS — Z5181 Encounter for therapeutic drug level monitoring: Secondary | ICD-10-CM | POA: Diagnosis not present

## 2020-07-22 DIAGNOSIS — I482 Chronic atrial fibrillation, unspecified: Secondary | ICD-10-CM

## 2020-07-22 LAB — POCT INR: INR: 2.6 (ref 2.0–3.0)

## 2020-07-22 NOTE — Patient Instructions (Signed)
Continue warfarin 1 tablet on Mondays and Thursdays and 1/2 tablet the rest of the week.  Recheck in 4 weeks.

## 2020-08-13 DIAGNOSIS — G4700 Insomnia, unspecified: Secondary | ICD-10-CM | POA: Diagnosis not present

## 2020-08-13 DIAGNOSIS — I480 Paroxysmal atrial fibrillation: Secondary | ICD-10-CM | POA: Diagnosis not present

## 2020-08-13 DIAGNOSIS — I10 Essential (primary) hypertension: Secondary | ICD-10-CM | POA: Diagnosis not present

## 2020-08-13 DIAGNOSIS — Z79899 Other long term (current) drug therapy: Secondary | ICD-10-CM | POA: Diagnosis not present

## 2020-08-16 ENCOUNTER — Other Ambulatory Visit: Payer: Self-pay

## 2020-08-16 ENCOUNTER — Ambulatory Visit (HOSPITAL_COMMUNITY)
Admission: RE | Admit: 2020-08-16 | Discharge: 2020-08-16 | Disposition: A | Discharge status: Home / Self Care | Payer: Medicare Other | Source: Ambulatory Visit | Attending: Internal Medicine | Admitting: Internal Medicine

## 2020-08-16 DIAGNOSIS — I701 Atherosclerosis of renal artery: Secondary | ICD-10-CM

## 2020-08-19 ENCOUNTER — Ambulatory Visit (INDEPENDENT_AMBULATORY_CARE_PROVIDER_SITE_OTHER): Payer: Medicare Other | Admitting: *Deleted

## 2020-08-19 DIAGNOSIS — I482 Chronic atrial fibrillation, unspecified: Secondary | ICD-10-CM

## 2020-08-19 DIAGNOSIS — Z5181 Encounter for therapeutic drug level monitoring: Secondary | ICD-10-CM

## 2020-08-19 LAB — POCT INR: INR: 2.2 (ref 2.0–3.0)

## 2020-08-19 NOTE — Patient Instructions (Signed)
Continue warfarin 1 tablet on Mondays and Thursdays and 1/2 tablet the rest of the week.  Recheck in 4 weeks. 

## 2020-08-20 ENCOUNTER — Other Ambulatory Visit (HOSPITAL_COMMUNITY): Payer: Self-pay | Admitting: Internal Medicine

## 2020-08-20 DIAGNOSIS — Q271 Congenital renal artery stenosis: Secondary | ICD-10-CM | POA: Diagnosis not present

## 2020-08-20 DIAGNOSIS — I482 Chronic atrial fibrillation, unspecified: Secondary | ICD-10-CM | POA: Diagnosis not present

## 2020-08-20 DIAGNOSIS — G451 Carotid artery syndrome (hemispheric): Secondary | ICD-10-CM | POA: Diagnosis not present

## 2020-08-20 DIAGNOSIS — I10 Essential (primary) hypertension: Secondary | ICD-10-CM | POA: Diagnosis not present

## 2020-08-20 DIAGNOSIS — Z1231 Encounter for screening mammogram for malignant neoplasm of breast: Secondary | ICD-10-CM

## 2020-09-02 ENCOUNTER — Other Ambulatory Visit: Payer: Self-pay

## 2020-09-02 ENCOUNTER — Ambulatory Visit (HOSPITAL_COMMUNITY)
Admission: RE | Admit: 2020-09-02 | Discharge: 2020-09-02 | Disposition: A | Discharge status: Home / Self Care | Payer: Medicare Other | Source: Ambulatory Visit | Attending: Internal Medicine | Admitting: Internal Medicine

## 2020-09-02 DIAGNOSIS — Z1231 Encounter for screening mammogram for malignant neoplasm of breast: Secondary | ICD-10-CM | POA: Diagnosis not present

## 2020-09-09 ENCOUNTER — Other Ambulatory Visit: Payer: Self-pay

## 2020-09-09 ENCOUNTER — Ambulatory Visit (HOSPITAL_COMMUNITY)
Admission: RE | Admit: 2020-09-09 | Discharge: 2020-09-09 | Disposition: A | Discharge status: Home / Self Care | Payer: Medicare Other | Source: Ambulatory Visit | Attending: Surgery | Admitting: Surgery

## 2020-09-09 ENCOUNTER — Encounter: Payer: Self-pay | Admitting: Surgery

## 2020-09-09 ENCOUNTER — Ambulatory Visit: Payer: Medicare Other | Admitting: Surgery

## 2020-09-09 VITALS — BP 169/92 | HR 90 | Temp 98.3°F | Resp 20 | Ht 63.0 in | Wt 172.0 lb

## 2020-09-09 DIAGNOSIS — I6522 Occlusion and stenosis of left carotid artery: Secondary | ICD-10-CM

## 2020-09-09 DIAGNOSIS — I6523 Occlusion and stenosis of bilateral carotid arteries: Secondary | ICD-10-CM | POA: Insufficient documentation

## 2020-09-09 NOTE — Progress Notes (Signed)
Vascular and Vein Specialist of [facility]  Patient [name] MRN: [id number] DOB: 1934/05/04 Sex: female   REASON FOR VISIT:    Follow up  HISOTRY OF PRESENT ILLNESS:    Savannah Kirk is a 87 y.o. female who was found to have a bruit on physical exam which led to an ultrasound showing a percent left carotid stenosis.  On 10/06/2019 she underwent left carotid endarterectomy.  Intraoperative findings included an 80% stenosis at the bifurcation with ulceration.  Her postoperative course was uncomplicated.  She did have a marginal mandibular neuropraxia which has nearly resolved.  She denies any new symptoms or complaints today.  Patient has a history of peripheral vascular disease having undergone right renal artery stenting in 2005. She is medically managed for hypertension. She suffers from hypercholesterolemia but is not taking a statin due to intolerance. She does have a history of spinal stenosis and some right lower extremity discomfort. She is on Coumadin for A. fib which is rate controlled. She is a former smoker.   PAST MEDICAL HISTORY:   Past Medical History:  Diagnosis Date  . Arthritis   . Atrial fibrillation ([place])   . Cervical disc disease   . Dyspnea    on exertion  . History of kidney stones   . Hyperlipidemia   . Hypertension   . Nosebleed   . RBBB (right bundle branch block)   . Renal artery stenosis ([place])   . Stenosis of carotid artery      FAMILY HISTORY:   Family History  Problem Relation Age of Onset  . Hypertension Mother   . Hypertension Sister   . Diabetes Sister     SOCIAL HISTORY:   Social History   Tobacco Use  . Smoking status: Former Smoker    Types: Cigarettes    Quit date: 06/14/1985    Years since quitting: 35.2  . Smokeless tobacco: Never Used  Substance Use Topics  . Alcohol use: No     ALLERGIES:   Allergies  Allergen Reactions  . Aspirin Other (See Comments)    Nose bleeds   . Penicillins Other (See Comments)    Skins comes off tongue  Did it involve swelling of the face/tongue/throat, SOB, or low BP? No Did it involve sudden or severe rash/hives, skin peeling, or any reaction on the inside of your mouth or nose? No Did you need to seek medical attention at a hospital or doctor's office? Yes When did it last happen?74-69 years old If all above answers are "NO", may proceed with cephalosporin use.      CURRENT MEDICATIONS:   Current Outpatient Medications  Medication Sig Dispense Refill  . acetaminophen (TYLENOL) 500 MG tablet Take 500-1,000 mg by mouth every 6 (six) hours as needed (for pain.).    Marland Kitchen diazepam (VALIUM) 5 MG tablet Take 5 mg by mouth at bedtime as needed for sedation.     Marland Kitchen diltiazem (CARDIZEM CD) 120 MG 24 hr capsule Take 1 capsule (120 mg total) by mouth daily. (Patient taking differently: Take 120 mg by mouth at bedtime.) 90 capsule 3  . doxazosin (CARDURA) 2 MG tablet Take 2 mg by mouth daily after lunch.     . hydrochlorothiazide (HYDRODIURIL) 25 MG tablet Take 25 mg by mouth daily.     Marland Kitchen loperamide (IMODIUM A-D) 2 MG tablet Take 2 mg by mouth daily as needed for diarrhea or loose stools.    . Omega-3 Fatty Acids (FISH OIL) 1000 MG  CAPS Take 1,000 mg by mouth daily after lunch.     . propranolol (INDERAL) 80 MG tablet Take 160 mg by mouth daily.    Marland Kitchen warfarin (COUMADIN) 5 MG tablet Take 1 tablet (5 mg total) by mouth daily. (Patient taking differently: Take 2.5 mg by mouth at bedtime.) 90 tablet 3   No current facility-administered medications for this visit.    REVIEW OF SYSTEMS:   [X]  denotes positive finding, [ ]  denotes negative finding Cardiac  Comments:  Chest pain or chest pressure:    Shortness of breath upon exertion:    Short of breath when lying flat:    Irregular heart rhythm:        Vascular    Pain in calf, thigh, or hip brought on by ambulation:    Pain in feet at night that wakes you up from your sleep:      Blood clot in your veins:    Leg swelling:         Pulmonary    Oxygen at home:    Productive cough:     Wheezing:         Neurologic    Sudden weakness in arms or legs:     Sudden numbness in arms or legs:     Sudden onset of difficulty speaking or slurred speech:    Temporary loss of vision in one eye:     Problems with dizziness:         Gastrointestinal    Blood in stool:     Vomited blood:         Genitourinary    Burning when urinating:     Blood in urine:        Psychiatric    Major depression:         Hematologic    Bleeding problems:    Problems with blood clotting too easily:        Skin    Rashes or ulcers:        Constitutional    Fever or chills:      PHYSICAL EXAM:   Vitals:   09/09/20 1123 09/09/20 1126  BP: (!) 165/91 (!) 169/92  Pulse: 90   Resp: 20   Temp: 98.3 F (36.8 C)   SpO2: 96%   Weight: 78 kg   Height: 5\' 3"  (1.6 m)     GENERAL: The patient is a well-nourished female, in no acute distress. The vital signs are documented above. CARDIAC: There is a regular rate and rhythm.  PULMONARY: Non-labored respirations MUSCULOSKELETAL: There are no major deformities or cyanosis. NEUROLOGIC: No focal weakness or paresthesias are detected. SKIN: There are no ulcers or rashes noted. PSYCHIATRIC: The patient has a normal affect.  STUDIES:   I have reviewed her carotid duplex with the following findings: Right Carotid: Velocities in the right ICA are consistent with a 1-39%  stenosis.   Left Carotid: Velocities in the left ICA are consistent with a 1-39%  stenosis.   MEDICAL ISSUES:   The patient is status post left carotid endarterectomy for asymptomatic stenosis in 2021.  Intraoperative findings included an 80% stenosis.  She did have a postoperative marginal mandibular neuropraxia which has essentially resolved.  She has no symptoms.  Ultrasound today shows a widely patent carotid arteries bilaterally.  There are no active vascular  issues anymore therefore I will send her back to Dr. Gwenlyn Found to manage all of her vascular needs.  She will follow-up with me on  an as-needed basis.    Leia Alf, MD, FACS [facility] and Vein Specialists of [facility] [phone] Pager [phone]

## 2020-09-16 ENCOUNTER — Other Ambulatory Visit: Payer: Self-pay

## 2020-09-16 ENCOUNTER — Ambulatory Visit (INDEPENDENT_AMBULATORY_CARE_PROVIDER_SITE_OTHER): Payer: Medicare Other | Admitting: *Deleted

## 2020-09-16 DIAGNOSIS — Z5181 Encounter for therapeutic drug level monitoring: Secondary | ICD-10-CM

## 2020-09-16 DIAGNOSIS — I482 Chronic atrial fibrillation, unspecified: Secondary | ICD-10-CM | POA: Diagnosis not present

## 2020-09-16 LAB — POCT INR: INR: 2.1 (ref 2.0–3.0)

## 2020-09-16 NOTE — Patient Instructions (Signed)
Continue warfarin 1 tablet on Mondays and Thursdays and 1/2 tablet the rest of the week.  Recheck in 6 weeks.

## 2020-10-03 ENCOUNTER — Ambulatory Visit: Payer: Medicare Other | Admitting: Internal Medicine

## 2020-10-28 ENCOUNTER — Ambulatory Visit (INDEPENDENT_AMBULATORY_CARE_PROVIDER_SITE_OTHER): Payer: Medicare Other | Admitting: *Deleted

## 2020-10-28 ENCOUNTER — Other Ambulatory Visit: Payer: Self-pay

## 2020-10-28 DIAGNOSIS — Z5181 Encounter for therapeutic drug level monitoring: Secondary | ICD-10-CM

## 2020-10-28 DIAGNOSIS — I482 Chronic atrial fibrillation, unspecified: Secondary | ICD-10-CM

## 2020-10-28 LAB — POCT INR: INR: 2.8 (ref 2.0–3.0)

## 2020-10-28 NOTE — Patient Instructions (Signed)
Continue warfarin 1 tablet on Mondays and Thursdays and 1/2 tablet the rest of the week.  Recheck in 6 weeks.

## 2020-11-11 ENCOUNTER — Telehealth: Payer: Self-pay | Admitting: *Deleted

## 2020-11-11 NOTE — Telephone Encounter (Signed)
Called pt back.  Discussed bruising/discoloration behind leg.  No injury that she is aware of.  Not painful.  Last INR 2 wks ago was normal.  Has not been on any new medications. Appt made for pt to come for INR check tomorrow 11/12/20.  She is in agreement.

## 2020-11-11 NOTE — Telephone Encounter (Signed)
Patient called wanted to speak to [facility]. She recently spoke to her Pharmacist because the back of her legs(behind the knees) is black. It is all on the inside of her thigh. He suggested she call you to see if her Warfarin may need to be adjusted. She can be reached at [phone].

## 2020-11-12 ENCOUNTER — Ambulatory Visit (INDEPENDENT_AMBULATORY_CARE_PROVIDER_SITE_OTHER): Payer: Medicare Other | Admitting: *Deleted

## 2020-11-12 DIAGNOSIS — I482 Chronic atrial fibrillation, unspecified: Secondary | ICD-10-CM

## 2020-11-12 DIAGNOSIS — Z5181 Encounter for therapeutic drug level monitoring: Secondary | ICD-10-CM | POA: Diagnosis not present

## 2020-11-12 LAB — POCT INR: INR: 2.9 (ref 2.0–3.0)

## 2020-11-12 NOTE — Patient Instructions (Signed)
Continue warfarin 1 tablet on Mondays and Thursdays and 1/2 tablet the rest of the week.  Recheck in 6 weeks.

## 2020-12-26 ENCOUNTER — Ambulatory Visit (INDEPENDENT_AMBULATORY_CARE_PROVIDER_SITE_OTHER): Payer: Medicare Other | Admitting: *Deleted

## 2020-12-26 DIAGNOSIS — Z5181 Encounter for therapeutic drug level monitoring: Secondary | ICD-10-CM | POA: Diagnosis not present

## 2020-12-26 DIAGNOSIS — I482 Chronic atrial fibrillation, unspecified: Secondary | ICD-10-CM

## 2020-12-26 LAB — POCT INR: INR: 2.4 (ref 2.0–3.0)

## 2020-12-26 NOTE — Patient Instructions (Signed)
Continue warfarin 1 tablet on Mondays and Thursdays and 1/2 tablet the rest of the week.  Recheck in 6 weeks.

## 2021-01-06 DIAGNOSIS — H52203 Unspecified astigmatism, bilateral: Secondary | ICD-10-CM | POA: Diagnosis not present

## 2021-01-06 DIAGNOSIS — H524 Presbyopia: Secondary | ICD-10-CM | POA: Diagnosis not present

## 2021-01-06 DIAGNOSIS — H5213 Myopia, bilateral: Secondary | ICD-10-CM | POA: Diagnosis not present

## 2021-01-06 DIAGNOSIS — H353131 Nonexudative age-related macular degeneration, bilateral, early dry stage: Secondary | ICD-10-CM | POA: Diagnosis not present

## 2021-01-06 DIAGNOSIS — Z961 Presence of intraocular lens: Secondary | ICD-10-CM | POA: Diagnosis not present

## 2021-01-14 ENCOUNTER — Other Ambulatory Visit: Payer: Self-pay | Admitting: Cardiovascular Disease

## 2021-02-13 ENCOUNTER — Ambulatory Visit (INDEPENDENT_AMBULATORY_CARE_PROVIDER_SITE_OTHER): Payer: Medicare Other | Admitting: *Deleted

## 2021-02-13 DIAGNOSIS — Z5181 Encounter for therapeutic drug level monitoring: Secondary | ICD-10-CM

## 2021-02-13 DIAGNOSIS — I482 Chronic atrial fibrillation, unspecified: Secondary | ICD-10-CM | POA: Diagnosis not present

## 2021-02-13 LAB — POCT INR: INR: 2.3 (ref 2.0–3.0)

## 2021-02-13 NOTE — Patient Instructions (Signed)
Continue warfarin 1 tablet on Mondays and Thursdays and 1/2 tablet the rest of the week.  Recheck in 6 weeks.

## 2021-02-18 DIAGNOSIS — I10 Essential (primary) hypertension: Secondary | ICD-10-CM | POA: Diagnosis not present

## 2021-02-18 DIAGNOSIS — I480 Paroxysmal atrial fibrillation: Secondary | ICD-10-CM | POA: Diagnosis not present

## 2021-02-20 DIAGNOSIS — Z23 Encounter for immunization: Secondary | ICD-10-CM | POA: Diagnosis not present

## 2021-03-27 ENCOUNTER — Ambulatory Visit (INDEPENDENT_AMBULATORY_CARE_PROVIDER_SITE_OTHER): Payer: Medicare Other | Admitting: *Deleted

## 2021-03-27 DIAGNOSIS — I482 Chronic atrial fibrillation, unspecified: Secondary | ICD-10-CM

## 2021-03-27 DIAGNOSIS — Z5181 Encounter for therapeutic drug level monitoring: Secondary | ICD-10-CM | POA: Diagnosis not present

## 2021-03-27 LAB — POCT INR: INR: 3.4 — AB (ref 2.0–3.0)

## 2021-03-27 NOTE — Patient Instructions (Signed)
Description   Hold warfarin today, then continue to take 1/2 a tablets daily except for 1 tablet on Mondays and Thursdays. Recheck INR in 3 weeks.

## 2021-04-17 ENCOUNTER — Ambulatory Visit: Payer: Medicare Other | Admitting: *Deleted

## 2021-04-17 ENCOUNTER — Other Ambulatory Visit: Payer: Self-pay

## 2021-04-17 DIAGNOSIS — I482 Chronic atrial fibrillation, unspecified: Secondary | ICD-10-CM | POA: Diagnosis not present

## 2021-04-17 DIAGNOSIS — Z5181 Encounter for therapeutic drug level monitoring: Secondary | ICD-10-CM | POA: Diagnosis not present

## 2021-04-17 DIAGNOSIS — Z23 Encounter for immunization: Secondary | ICD-10-CM | POA: Diagnosis not present

## 2021-04-17 LAB — POCT INR: INR: 2.7 (ref 2.0–3.0)

## 2021-04-17 NOTE — Patient Instructions (Signed)
Continue warfarin 1/2 tablet daily except for 1 tablet on Mondays and Thursdays. Recheck INR in 4 weeks.

## 2021-05-15 ENCOUNTER — Ambulatory Visit: Payer: Medicare Other | Admitting: *Deleted

## 2021-05-15 DIAGNOSIS — Z5181 Encounter for therapeutic drug level monitoring: Secondary | ICD-10-CM | POA: Diagnosis not present

## 2021-05-15 DIAGNOSIS — I482 Chronic atrial fibrillation, unspecified: Secondary | ICD-10-CM | POA: Diagnosis not present

## 2021-05-15 LAB — POCT INR: INR: 2.3 (ref 2.0–3.0)

## 2021-05-15 NOTE — Patient Instructions (Signed)
Continue warfarin 1/2 tablet daily except for 1 tablet on Mondays and Thursdays. Recheck INR in 4 weeks.

## 2021-05-29 DIAGNOSIS — L821 Other seborrheic keratosis: Secondary | ICD-10-CM | POA: Diagnosis not present

## 2021-05-29 DIAGNOSIS — L570 Actinic keratosis: Secondary | ICD-10-CM | POA: Diagnosis not present

## 2021-05-29 DIAGNOSIS — X32XXXD Exposure to sunlight, subsequent encounter: Secondary | ICD-10-CM | POA: Diagnosis not present

## 2021-05-29 DIAGNOSIS — L718 Other rosacea: Secondary | ICD-10-CM | POA: Diagnosis not present

## 2021-05-29 DIAGNOSIS — B078 Other viral warts: Secondary | ICD-10-CM | POA: Diagnosis not present

## 2021-06-12 ENCOUNTER — Ambulatory Visit: Payer: Medicare Other | Admitting: *Deleted

## 2021-06-12 DIAGNOSIS — I482 Chronic atrial fibrillation, unspecified: Secondary | ICD-10-CM | POA: Diagnosis not present

## 2021-06-12 DIAGNOSIS — Z5181 Encounter for therapeutic drug level monitoring: Secondary | ICD-10-CM

## 2021-06-12 LAB — POCT INR: INR: 2.7 (ref 2.0–3.0)

## 2021-06-12 NOTE — Patient Instructions (Signed)
Continue warfarin 1/2 tablet daily except for 1 tablet on Mondays and Thursdays.  Recheck INR in 6 weeks.

## 2021-06-19 DIAGNOSIS — Z23 Encounter for immunization: Secondary | ICD-10-CM | POA: Diagnosis not present

## 2021-06-30 IMAGING — MG MM DIGITAL SCREENING BILAT W/ TOMO AND CAD
6 of 12 series · 6 of 36 positions shown · non-contrast
Comparison: None.

CLINICAL DATA: Screening. New baseline examination

EXAM:
DIGITAL SCREENING BILATERAL MAMMOGRAM WITH TOMOSYNTHESIS AND CAD
TECHNIQUE: Bilateral screening digital craniocaudal and mediolateral oblique
mammograms were obtained. Bilateral screening digital breast
tomosynthesis was performed. The images were evaluated with
computer-aided detection.

[L MLO synth-2D]
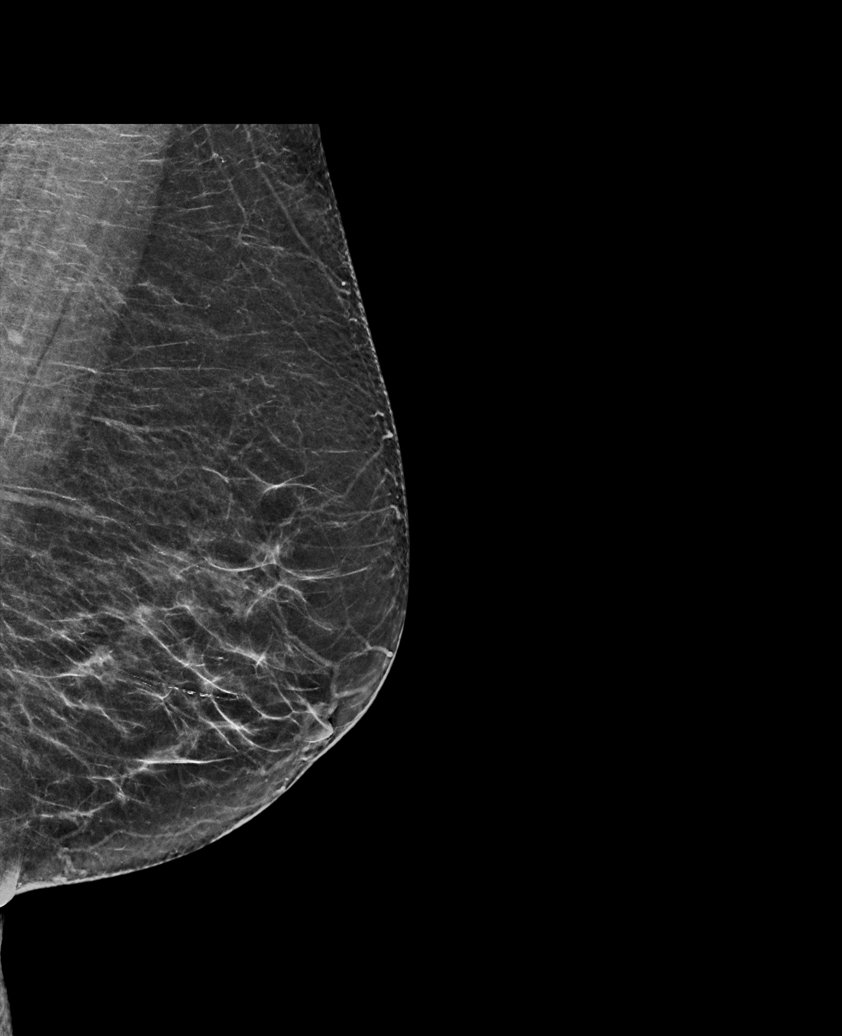

[R MLO synth-2D]
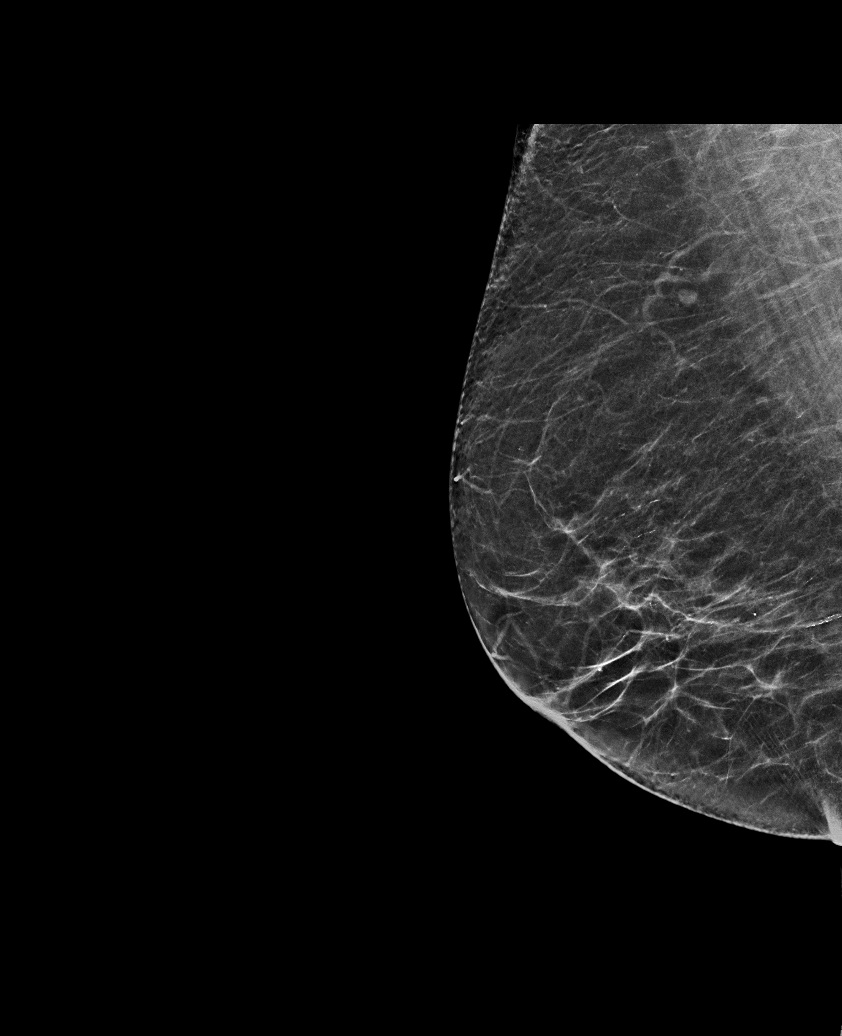

[L CC synth-2D (1 of 2)]
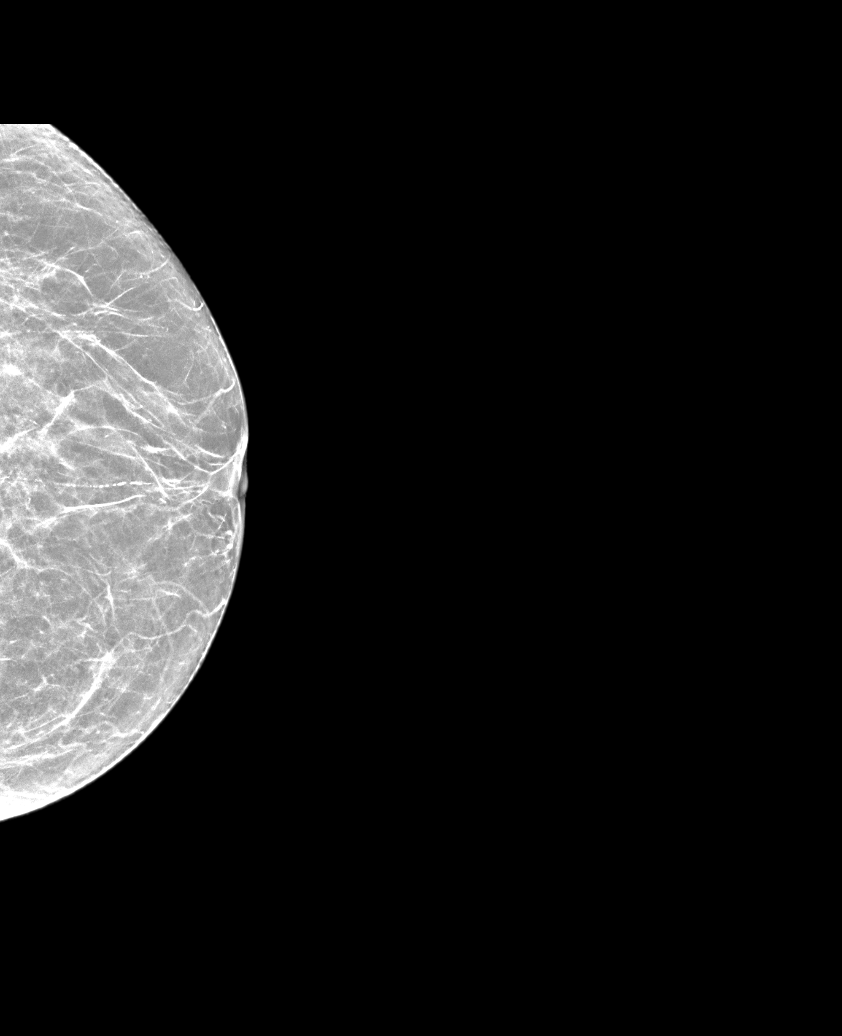

[L CC synth-2D (2 of 2)]
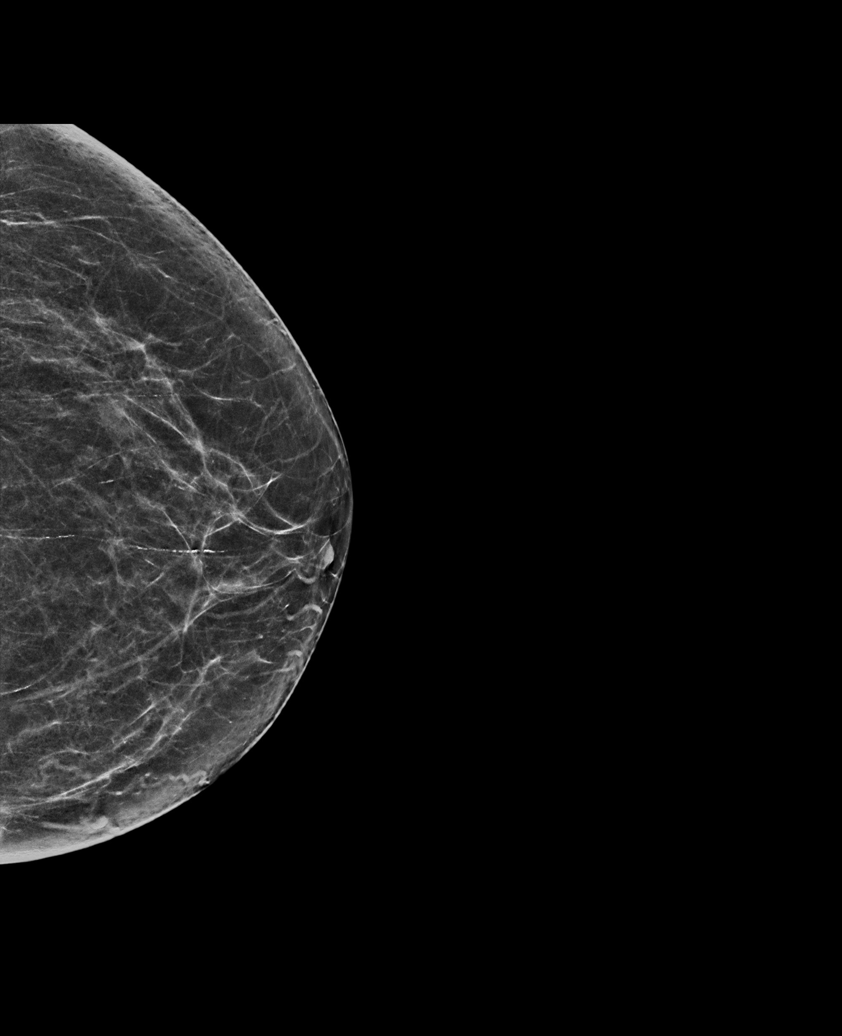

[R CC synth-2D (1 of 2)]
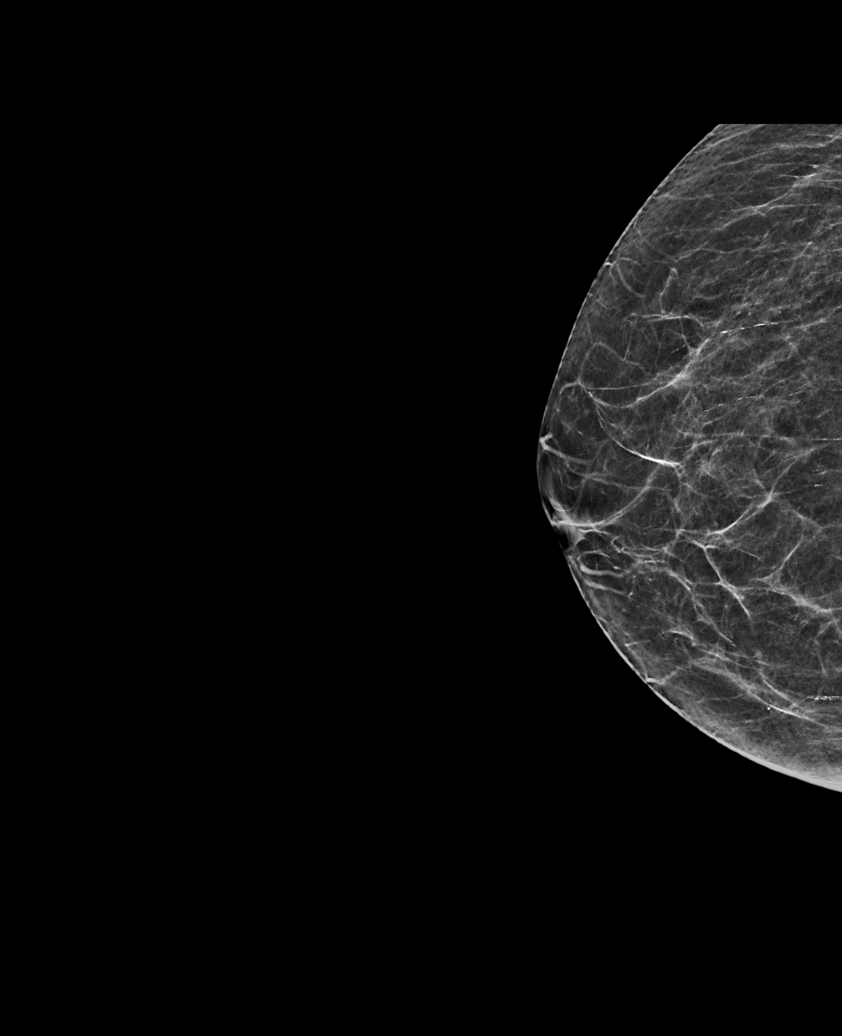

[R CC synth-2D (2 of 2)]
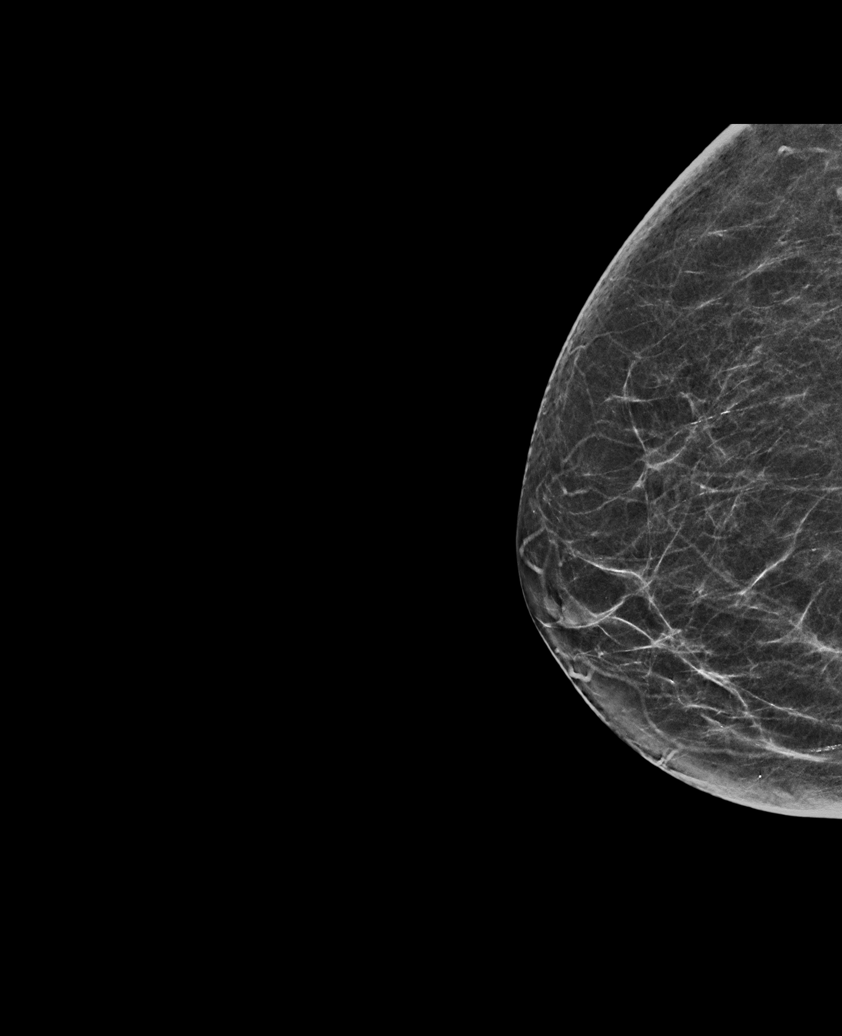

[6 of 36 positions shown; findings below may reference images not displayed]

ACR Breast Density Category b: There are scattered areas of
fibroglandular density.
FINDINGS: There are no findings suspicious for malignancy.
IMPRESSION: No mammographic evidence of malignancy. A result letter of this
screening mammogram will be mailed directly to the patient.

RECOMMENDATION:
Screening mammogram in one year. (Code:I3-M-4W6)

BI-RADS CATEGORY  1: Negative.

## 2021-07-15 ENCOUNTER — Telehealth: Payer: Self-pay | Admitting: Cardiovascular Disease

## 2021-07-15 ENCOUNTER — Other Ambulatory Visit: Payer: Self-pay | Admitting: Cardiovascular Disease

## 2021-07-15 NOTE — Telephone Encounter (Signed)
Pt c/o of Chest Pain: STAT if CP now or developed within 24 hours  1. Are you having CP right now? No   2. Are you experiencing any other symptoms (ex. SOB, nausea, vomiting, sweating)? Suffers from afib  3. How long have you been experiencing CP? Friday and Saturday   4. Is your CP continuous or coming and going? Continuous (feels like heart is heavy)  5. Have you taken Nitroglycerin? No  ?

## 2021-07-15 NOTE — Telephone Encounter (Signed)
Prescription refill request received for warfarin [place]: 02/13/21 ([place])  Next INR check: 07/24/21 Warfarin tablet strength: 5mg   Appropriate dose and refill sent to requested pharmacy.

## 2021-07-15 NOTE — Telephone Encounter (Signed)
Spoke with pt, she developed a heavy feeling in her chest last Friday evening. It eventually went away and she slept fine. It returned Saturday morning and lasted all day Saturday. She has had none since then. She reports it felt similar to the acid reflux pain in the past but was in a different location. This was located in the left side of her chest. There was no belching and no change in pain with movement. She is overdue for her 1 year follow up appointment. Follow up scheduled

## 2021-07-18 ENCOUNTER — Other Ambulatory Visit: Payer: Self-pay

## 2021-07-18 ENCOUNTER — Ambulatory Visit: Payer: Medicare Other | Admitting: Cardiovascular Disease

## 2021-07-18 ENCOUNTER — Encounter: Payer: Self-pay | Admitting: Cardiovascular Disease

## 2021-07-18 VITALS — BP 128/66 | HR 63 | Ht 63.0 in | Wt 168.0 lb

## 2021-07-18 DIAGNOSIS — I6522 Occlusion and stenosis of left carotid artery: Secondary | ICD-10-CM | POA: Diagnosis not present

## 2021-07-18 DIAGNOSIS — I482 Chronic atrial fibrillation, unspecified: Secondary | ICD-10-CM | POA: Diagnosis not present

## 2021-07-18 DIAGNOSIS — R0789 Other chest pain: Secondary | ICD-10-CM | POA: Diagnosis not present

## 2021-07-18 DIAGNOSIS — I10 Essential (primary) hypertension: Secondary | ICD-10-CM

## 2021-07-18 DIAGNOSIS — I701 Atherosclerosis of renal artery: Secondary | ICD-10-CM

## 2021-07-18 DIAGNOSIS — I6523 Occlusion and stenosis of bilateral carotid arteries: Secondary | ICD-10-CM

## 2021-07-18 NOTE — Assessment & Plan Note (Signed)
History of chronic A. fib rate controlled on Coumadin oral anticoagulation

## 2021-07-18 NOTE — Assessment & Plan Note (Signed)
Savannah Kirk complained of some atypical chest pain last week that lasted for 2 days and then resolve spontaneously.  She did have a Myoview stress test performed 09/12/2019 which was low risk and nonischemic.  If she has recurrent chest pain with ischemic qualities we will further evaluate by coronary CTA.

## 2021-07-18 NOTE — Assessment & Plan Note (Signed)
History of renal artery stenosis status post right renal artery stenting by myself 07/06/2004.  Her blood pressures have been under good control.  Her renal Doppler studies performed as recently as August 19, 2020 revealed a widely patent right renal artery stent.  We will continue to follow her noninvasively.

## 2021-07-18 NOTE — Progress Notes (Signed)
07/18/2021 [place]   1934-04-27  [id number]  Primary Physician Asencion Noble, MD Primary Cardiologist: Lorretta Harp MD FACP, [place], [place], [place]  HPI:  Savannah Kirk is a 88 y.o.  mildly overweight, divorced Caucasian female, mother of 3 who I last saw in the office 08/14/2019.Marland Kitchen She has a history of PAD status post right renal artery PTA and stenting by myself July 06, 2004. We have been following this by duplex ultrasound which was last done August 03, 2011, that was stable. Her other problems include hypertension and hyperlipidemia. Not on a statin drug because of statin intolerance. She also has right lower extremity discomfort diagnosed by Dr. Christella Noa and spinal stenosis with radicular pain improved with steroid injection. Since I saw her a year ago she's remained otherwise asymptomatic. Her most recent l renal Doppler study performed 01/03/14 revealed a widely patent right renal artery stent. Dr. Willey Blade follows her lipid profile.     When I saw her a year ago she was in A. fib.  I referred her to the A. fib clinic.  She was originally started on Eliquis which was transitioned to warfarin because of side effects.  She is rate controlled.  She was complaining of dyspnea I last saw her and she had negative Myoview in February of last year and a normal 2D echo in January of last year.   Because of an auscultated bruit I performed carotid Doppler study on her 08/17/2019 revealing a high-grade left ICA stenosis.  Based on this I I referred her to Dr. Trula Slade for elective left carotid endarterectomy.   Because of complaints of atypical chest pain and preoperative evaluation she underwent Myoview stress testing 09/12/2019 which was low risk and nonischemic.  She underwent uncomplicated endarterectomy on 10/06/2019 and is recuperated nicely.  Her only major complaint is of chronic fatigue and shortness of breath.   Since I saw her in the office 1 year ago she continues to do well.  We have been  following her renal and carotid Doppler studies.  She did come plaint of some chest pain that occurred last week lasting for 2 days and spontaneously resolving.  This did not have ischemic features.   Current Meds  Medication Sig   acetaminophen (TYLENOL) 500 MG tablet Take 500-1,000 mg by mouth every 6 (six) hours as needed (for pain.).   diazepam (VALIUM) 5 MG tablet Take 5 mg by mouth at bedtime as needed for sedation.    doxazosin (CARDURA) 2 MG tablet Take 2 mg by mouth daily after lunch.    hydrochlorothiazide (HYDRODIURIL) 25 MG tablet Take 25 mg by mouth daily.    loperamide (IMODIUM A-D) 2 MG tablet Take 2 mg by mouth daily as needed for diarrhea or loose stools.   Omega-3 Fatty Acids (FISH OIL) 1000 MG CAPS Take 1,000 mg by mouth daily after lunch.    propranolol (INDERAL) 80 MG tablet Take 160 mg by mouth daily.   warfarin (COUMADIN) 5 MG tablet TAKE ONE TABLET BY MOUTH ONCE DAILY.     Allergies  Allergen Reactions   Aspirin Other (See Comments)    Nose bleeds   Penicillins Other (See Comments)    Skins comes off tongue  Did it involve swelling of the face/tongue/throat, SOB, or low BP? No Did it involve sudden or severe rash/hives, skin peeling, or any reaction on the inside of your mouth or nose? No Did you need to seek medical attention at a hospital or  doctor's office? Yes When did it last happen?     16-20 years old  If all above answers are NO, may proceed with cephalosporin use.     Social History   Socioeconomic History   Marital status: Married    Spouse name: Not on file   Number of children: Not on file   Years of education: Not on file   Highest education level: Not on file  Occupational History   Not on file  Tobacco Use   Smoking status: Former    Types: Cigarettes    Quit date: 06/14/1985    Years since quitting: 36.1   Smokeless tobacco: Never  Vaping Use   Vaping Use: Never used  Substance and Sexual Activity   Alcohol use: No   Drug use:  No   Sexual activity: Not on file  Other Topics Concern   Not on file  Social History Narrative   Not on file   Social Determinants of Health   Financial Resource Strain: Not on file  Food Insecurity: Not on file  Transportation Needs: Not on file  Physical Activity: Not on file  Stress: Not on file  Social Connections: Not on file  Intimate Partner Violence: Not on file     Review of Systems: General: negative for chills, fever, night sweats or weight changes.  Cardiovascular: negative for chest pain, dyspnea on exertion, edema, orthopnea, palpitations, paroxysmal nocturnal dyspnea or shortness of breath Dermatological: negative for rash Respiratory: negative for cough or wheezing Urologic: negative for hematuria Abdominal: negative for nausea, vomiting, diarrhea, bright red blood per rectum, melena, or hematemesis Neurologic: negative for visual changes, syncope, or dizziness All other systems reviewed and are otherwise negative except as noted above.    Blood pressure 128/66, pulse 63, height 5\' 3"  (1.6 m), weight 168 lb (76.2 kg).  General appearance: alert and no distress Neck: no adenopathy, no carotid bruit, no JVD, supple, symmetrical, trachea midline, and thyroid not enlarged, symmetric, no tenderness/mass/nodules Lungs: clear to auscultation bilaterally Heart: irregularly irregular rhythm Extremities: extremities normal, atraumatic, no cyanosis or edema Pulses: 2+ and symmetric Skin: Skin color, texture, turgor normal. No rashes or lesions Neurologic: Grossly normal  EKG atrial fibrillation with right bundle branch block left axis deviation with a ventricular sponsor of 63.  I personally reviewed this EKG.  ASSESSMENT AND PLAN:   Renal artery stenosis (HCC) History of renal artery stenosis status post right renal artery stenting by myself 07/06/2004.  Her blood pressures have been under good control.  Her renal Doppler studies performed as recently as 28-Aug-2020  revealed a widely patent right renal artery stent.  We will continue to follow her noninvasively.  Essential hypertension History of essential hypertension blood pressure measured today at 128/66.  She is on hydrochlorothiazide, propranolol and diltiazem.  Hyperlipidemia History of hyperlipidemia intolerant to statin therapy and without interesting considering a PCSK9 with lipid profile performed 08/13/2020 revealing total cholesterol of 229, LDL 143 and HDL 61.  Atrial fibrillation, chronic History of chronic A. fib rate controlled on Coumadin oral anticoagulation  Carotid artery disease (HCC) History of carotid artery disease with high-grade left ICA stenosis found by duplex ultrasound after I auscultated a bruit.  I referred her to Dr. Trula Slade who ultimately performed elective left carotid endarterectomy 10/06/2019.  We have been following her carotid Doppler since that time which most recently performed 09/09/2020 revealed a widely patent endarterectomy site.  We will continue to follow her noninvasively.  Atypical chest pain Ms. Novell  complained of some atypical chest pain last week that lasted for 2 days and then resolve spontaneously.  She did have a Myoview stress test performed 09/12/2019 which was low risk and nonischemic.  If she has recurrent chest pain with ischemic qualities we will further evaluate by coronary CTA.     Lorretta Harp MD FACP,FACC,FAHA, [facility] 07/18/2021 8:18 AM

## 2021-07-18 NOTE — Patient Instructions (Signed)
Medication Instructions:  Your physician recommends that you continue on your current medications as directed. Please refer to the Current Medication list given to you today.  *If you need a refill on your cardiac medications before your next appointment, please call your pharmacy*   Testing/Procedures: Your physician has requested that you have a carotid duplex. This test is an ultrasound of the carotid arteries in your neck. It looks at blood flow through these arteries that supply the brain with blood. Allow one hour for this exam. There are no restrictions or special instructions.  Your physician has requested that you have a renal artery duplex. During this test, an ultrasound is used to evaluate blood flow to the kidneys. Allow one hour for this exam. Do not eat after midnight the day before and avoid carbonated beverages. Take your medications as you usually do.   To be done in Feb. 2023. These procedures are done at [facility].    Follow-Up: At [facility], you and your health needs are our priority.  As part of our continuing mission to provide you with exceptional heart care, we have created designated Provider Care Teams.  These Care Teams include your primary Cardiologist (physician) and Advanced Practice Providers (APPs -  Physician Assistants and Nurse Practitioners) who all work together to provide you with the care you need, when you need it.  We recommend signing up for the patient portal called "MyChart".  Sign up information is provided on this After Visit Summary.  MyChart is used to connect with patients for Virtual Visits (Telemedicine).  Patients are able to view lab/test results, encounter notes, upcoming appointments, etc.  Non-urgent messages can be sent to your provider as well.   To learn more about what you can do with MyChart, go to [email or web address].    Your next appointment:   3 month(s)  The format for your next appointment:   In  Person  Provider:   Coletta Memos, FNP, Fabian Sharp, PA-C, Sande Rives, PA-C, Caron Presume, PA-C, Jory Sims, DNP, ANP, or Almyra Deforest, PA-C       Then, Quay Burow, MD will plan to see you again in 12 month(s).

## 2021-07-18 NOTE — Assessment & Plan Note (Signed)
History of carotid artery disease with high-grade left ICA stenosis found by duplex ultrasound after I auscultated a bruit.  I referred her to Dr. Trula Slade who ultimately performed elective left carotid endarterectomy 10/06/2019.  We have been following her carotid Doppler since that time which most recently performed 09/09/2020 revealed a widely patent endarterectomy site.  We will continue to follow her noninvasively.

## 2021-07-18 NOTE — Assessment & Plan Note (Signed)
History of essential hypertension blood pressure measured today at 128/66.  She is on hydrochlorothiazide, propranolol and diltiazem.

## 2021-07-18 NOTE — Assessment & Plan Note (Signed)
History of hyperlipidemia intolerant to statin therapy and without interesting considering a PCSK9 with lipid profile performed 08/13/2020 revealing total cholesterol of 229, LDL 143 and HDL 61.

## 2021-07-24 ENCOUNTER — Ambulatory Visit: Payer: Medicare Other | Admitting: *Deleted

## 2021-07-24 DIAGNOSIS — Z5181 Encounter for therapeutic drug level monitoring: Secondary | ICD-10-CM | POA: Diagnosis not present

## 2021-07-24 DIAGNOSIS — I482 Chronic atrial fibrillation, unspecified: Secondary | ICD-10-CM | POA: Diagnosis not present

## 2021-07-24 LAB — POCT INR: INR: 3.2 — AB (ref 2.0–3.0)

## 2021-07-24 NOTE — Patient Instructions (Signed)
Hold warfarin tonight then resume 1/2 tablet daily except for 1 tablet on Mondays and Thursdays.  Recheck INR in 6 weeks.

## 2021-08-14 ENCOUNTER — Encounter: Payer: Self-pay | Admitting: Cardiology

## 2021-08-14 DIAGNOSIS — I10 Essential (primary) hypertension: Secondary | ICD-10-CM | POA: Diagnosis not present

## 2021-08-14 DIAGNOSIS — I6521 Occlusion and stenosis of right carotid artery: Secondary | ICD-10-CM | POA: Diagnosis not present

## 2021-08-14 DIAGNOSIS — Z79899 Other long term (current) drug therapy: Secondary | ICD-10-CM | POA: Diagnosis not present

## 2021-08-14 DIAGNOSIS — I482 Chronic atrial fibrillation, unspecified: Secondary | ICD-10-CM | POA: Diagnosis not present

## 2021-08-14 DIAGNOSIS — E785 Hyperlipidemia, unspecified: Secondary | ICD-10-CM | POA: Diagnosis not present

## 2021-08-21 DIAGNOSIS — I480 Paroxysmal atrial fibrillation: Secondary | ICD-10-CM | POA: Diagnosis not present

## 2021-08-21 DIAGNOSIS — G629 Polyneuropathy, unspecified: Secondary | ICD-10-CM | POA: Diagnosis not present

## 2021-08-21 DIAGNOSIS — I10 Essential (primary) hypertension: Secondary | ICD-10-CM | POA: Diagnosis not present

## 2021-09-03 DIAGNOSIS — M792 Neuralgia and neuritis, unspecified: Secondary | ICD-10-CM | POA: Diagnosis not present

## 2021-09-03 DIAGNOSIS — M79672 Pain in left foot: Secondary | ICD-10-CM | POA: Diagnosis not present

## 2021-09-03 DIAGNOSIS — M79671 Pain in right foot: Secondary | ICD-10-CM | POA: Diagnosis not present

## 2021-09-04 ENCOUNTER — Ambulatory Visit: Payer: Medicare Other | Admitting: *Deleted

## 2021-09-04 DIAGNOSIS — I482 Chronic atrial fibrillation, unspecified: Secondary | ICD-10-CM

## 2021-09-04 DIAGNOSIS — Z5181 Encounter for therapeutic drug level monitoring: Secondary | ICD-10-CM

## 2021-09-04 LAB — POCT INR: INR: 2.6 (ref 2.0–3.0)

## 2021-09-04 NOTE — Patient Instructions (Signed)
Had gums bleeding 2 wks ago.  She cut warfarin back to 1/2 tablet daily.  Will decrease warfarin to 1/2 tablet daily.  Recheck INR in 3 weeks.

## 2021-09-10 DIAGNOSIS — M722 Plantar fascial fibromatosis: Secondary | ICD-10-CM | POA: Diagnosis not present

## 2021-09-10 DIAGNOSIS — M79671 Pain in right foot: Secondary | ICD-10-CM | POA: Diagnosis not present

## 2021-09-18 DIAGNOSIS — L570 Actinic keratosis: Secondary | ICD-10-CM | POA: Diagnosis not present

## 2021-09-18 DIAGNOSIS — X32XXXD Exposure to sunlight, subsequent encounter: Secondary | ICD-10-CM | POA: Diagnosis not present

## 2021-09-18 DIAGNOSIS — C44311 Basal cell carcinoma of skin of nose: Secondary | ICD-10-CM | POA: Diagnosis not present

## 2021-09-25 ENCOUNTER — Ambulatory Visit: Payer: Medicare Other | Admitting: *Deleted

## 2021-09-25 DIAGNOSIS — Z5181 Encounter for therapeutic drug level monitoring: Secondary | ICD-10-CM | POA: Diagnosis not present

## 2021-09-25 DIAGNOSIS — I482 Chronic atrial fibrillation, unspecified: Secondary | ICD-10-CM | POA: Diagnosis not present

## 2021-09-25 LAB — POCT INR: INR: 2 (ref 2.0–3.0)

## 2021-09-25 NOTE — Patient Instructions (Signed)
Continue warfarin 1/2 tablet daily.  ?Recheck INR in 4 weeks.  ?

## 2021-10-08 ENCOUNTER — Ambulatory Visit (HOSPITAL_COMMUNITY)
Admission: RE | Admit: 2021-10-08 | Discharge: 2021-10-08 | Disposition: A | Discharge status: Home / Self Care | Payer: Medicare Other | Source: Ambulatory Visit | Attending: Cardiology | Admitting: Cardiology

## 2021-10-08 ENCOUNTER — Other Ambulatory Visit: Payer: Self-pay

## 2021-10-08 ENCOUNTER — Ambulatory Visit (HOSPITAL_BASED_OUTPATIENT_CLINIC_OR_DEPARTMENT_OTHER)
Admission: RE | Admit: 2021-10-08 | Discharge: 2021-10-08 | Disposition: A | Discharge status: Home / Self Care | Payer: Medicare Other | Source: Ambulatory Visit | Attending: Cardiovascular Disease | Admitting: Cardiovascular Disease

## 2021-10-08 DIAGNOSIS — I6523 Occlusion and stenosis of bilateral carotid arteries: Secondary | ICD-10-CM | POA: Insufficient documentation

## 2021-10-08 DIAGNOSIS — I701 Atherosclerosis of renal artery: Secondary | ICD-10-CM | POA: Diagnosis not present

## 2021-10-15 NOTE — Progress Notes (Signed)
?Cardiology Clinic Note  ? ?Patient [name] ?Date of Encounter: 10/17/2021 ? ?Primary Care Provider:  Asencion Noble, MD ?Primary Cardiologist:  Quay Burow, MD ? ?Patient Profile  ?  ?88 year old female patient with history of renal artery stenosis status post right renal artery stenting by Dr. Gwenlyn Found on 07/06/2004, with follow-up renal artery Doppler studies on 08/16/2020 which revealed widely patent right renal artery stent, hypertension, hyperlipidemia intolerant to statin therapy, atrial fibrillation on warfarin therapy, CHADS VASC Score of  4, carotid artery disease with high-grade left ICA stenosis status post left carotid endarterectomy 10/06/2019, with follow-up carotid Doppler on 3 09/09/2020 which revealed widely patent endarterectomy site.  At last office visit dated 07/18/2021 the patient did complain of some chest pain which resolved spontaneously.  Follow-up coronary CTA if chest discomfort persisted. ? ?Past Medical History  ?  ?Past Medical History:  ?Diagnosis Date  ? Arthritis   ? Atrial fibrillation ([place])   ? Cervical disc disease   ? Dyspnea   ? on exertion  ? History of kidney stones   ? Hyperlipidemia   ? Hypertension   ? Nosebleed   ? RBBB (right bundle branch block)   ? Renal artery stenosis ([place])   ? Stenosis of carotid artery   ? ?Past Surgical History:  ?Procedure Laterality Date  ? BACK SURGERY    ? lumbar interbody fusion  ? Cardiolite  12/205  ? negative  ? CAROTID ENDARTERECTOMY Left 10/06/2019  ? CAROTID STENT  07-06-04  ? CATARACT EXTRACTION    ? DOPPLER ECHOCARDIOGRAPHY    ? normal 2D. borderline concentric LVH  ? ENDARTERECTOMY Left 10/06/2019  ? Procedure: LEFT ENDARTERECTOMY CAROTID;  Surgeon: Serafina Mitchell, MD;  Location: [facility] OR;  Service: Vascular;  Laterality: Left;  ? kidney stent    ? PATCH ANGIOPLASTY Left 10/06/2019  ? Procedure: Patch Angioplasty using a Xenosure Biologic Patch;  Surgeon: Serafina Mitchell, MD;  Location: [facility] OR;  Service: Vascular;  Laterality: Left;  ?  renal doppler  08/04/2006  ? patent right renal artery stent  ? TONSILLECTOMY    ? ? ?Allergies ? ?Allergies  ?Allergen Reactions  ? Aspirin Other (See Comments)  ?  Nose bleeds  ? Penicillins Other (See Comments)  ?  Skins comes off tongue ? ?Did it involve swelling of the face/tongue/throat, SOB, or low BP? No ?Did it involve sudden or severe rash/hives, skin peeling, or any reaction on the inside of your mouth or nose? No ?Did you need to seek medical attention at a hospital or doctor's office? Yes ?When did it last happen?     40-66 years old  ?If all above answers are ?NO?, may proceed with cephalosporin use. ?  ? ? ?History of Present Illness  ?  ?Savannah Kirk is an 88 year old female patient with peripheral arterial disease to include renal artery stenosis status post right renal artery stenting, carotid artery disease status post left carotid endarterectomy, hyperlipidemia, atrial fibrillation and hypertension.  She comes today with complaints of generalized fatigue.  She states she did not feel this way when she was just on propanolol but with addition of diltiazem she has noticed this more so.   ? ?She has been followed closely by Dr. Willey Blade, her PCP.  Recent labs have been completed at which she brings a copy, completed on 2/23.  All values were essentially within normal limits.  No evidence of anemia or renal insufficiency.  It was noted, that he did have 1+  protein in her urinalysis.  She still remains active working in her yard but tires more easily.  Denies rapid heart rhythm with exertion.  Is followed by heart care in [facility] for Coumadin management. ? ?Home Medications  ?  ?Current Outpatient Medications  ?Medication Sig Dispense Refill  ? acetaminophen (TYLENOL) 500 MG tablet Take 500-1,000 mg by mouth every 6 (six) hours as needed (for pain.).    ? diazepam (VALIUM) 5 MG tablet Take 5 mg by mouth at bedtime as needed for sedation.     ? diltiazem (CARDIZEM CD) 120 MG 24 hr capsule Take 1 capsule (120 mg  total) by mouth daily. (Patient taking differently: Take 120 mg by mouth at bedtime.) 90 capsule 3  ? doxazosin (CARDURA) 2 MG tablet Take 2 mg by mouth daily after lunch.     ? hydrochlorothiazide (HYDRODIURIL) 25 MG tablet Take 25 mg by mouth daily.     ? propranolol (INDERAL) 80 MG tablet Take 160 mg by mouth daily.    ? warfarin (COUMADIN) 5 MG tablet TAKE ONE TABLET BY MOUTH ONCE DAILY. 90 tablet 0  ? ?No current facility-administered medications for this visit.  ?  ? ?Family History  ?  ?Family History  ?Problem Relation Age of Onset  ? Hypertension Mother   ? Hypertension Sister   ? Diabetes Sister   ? ?She indicated that her mother is deceased. She indicated that her father is deceased. She indicated that the status of her sister is unknown. ? ?Social History  ?  ?Social History  ? ?Socioeconomic History  ? Marital status: Married  ?  Spouse name: Not on file  ? Number of children: Not on file  ? Years of education: Not on file  ? Highest education level: Not on file  ?Occupational History  ? Not on file  ?Tobacco Use  ? Smoking status: Former  ?  Types: Cigarettes  ?  Quit date: 06/14/1985  ?  Years since quitting: 36.3  ? Smokeless tobacco: Never  ?Vaping Use  ? Vaping Use: Never used  ?Substance and Sexual Activity  ? Alcohol use: No  ? Drug use: No  ? Sexual activity: Not on file  ?Other Topics Concern  ? Not on file  ?Social History Narrative  ? Not on file  ? ?Social Determinants of Health  ? ?Financial Resource Strain: Not on file  ?Food Insecurity: Not on file  ?Transportation Needs: Not on file  ?Physical Activity: Not on file  ?Stress: Not on file  ?Social Connections: Not on file  ?Intimate Partner Violence: Not on file  ?  ? ?Review of Systems  ?  ?General:  No chills, fever, night sweats or weight changes.  ?Cardiovascular:  No chest pain, dyspnea on exertion, edema, orthopnea, palpitations, paroxysmal nocturnal dyspnea. ?Dermatological: No rash, lesions/masses ?Respiratory: No cough,  dyspnea ?Urologic: No hematuria, dysuria ?Abdominal:   No nausea, vomiting, diarrhea, bright red blood per rectum, melena, or hematemesis ?Neurologic:  No visual changes, wkns, changes in mental status. ?All other systems reviewed and are otherwise negative except as noted above. ? ?  ? ?Physical Exam  ?  ?VS:  BP 140/68   Pulse 94   Ht '5\' 3"'$  (1.6 m)   Wt 163 lb (73.9 kg)   SpO2 98%   BMI 28.87 kg/m?  , BMI Body mass index is 28.87 kg/m?. ?    ?GEN: Well nourished, well developed, in no acute distress. ?HEENT: normal. ?Neck: Supple, no JVD, carotid bruits,  or masses. ?Cardiac: IRRR, no murmurs, rubs, or gallops. No clubbing, cyanosis, edema.  Radials/DP/PT 2+ and equal bilaterally.  ?Respiratory:  Respirations regular and unlabored, clear to auscultation bilaterally. ?GI: Soft, nontender, nondistended, BS + x 4. ?MS: no deformity or atrophy. ?Skin: warm and dry, no rash. ?Neuro:  Strength and sensation are intact. ?Psych: Normal affect. ? ?Accessory Clinical Findings  ?  ?ECG personally reviewed by me today- - No acute changes ? ?Lab Results  ?Component Value Date  ? WBC 9.5 10/07/2019  ? HGB 11.7 (L) 10/07/2019  ? HCT 35.5 (L) 10/07/2019  ? MCV 93.7 10/07/2019  ? PLT 199 10/07/2019  ? ?Lab Results  ?Component Value Date  ? CREATININE 0.92 10/07/2019  ? BUN 18 10/07/2019  ? NA 137 10/07/2019  ? K 3.8 10/07/2019  ? CL 102 10/07/2019  ? CO2 24 10/07/2019  ? ?Lab Results  ?Component Value Date  ? ALT 12 06/12/2020  ? AST 14 06/12/2020  ? ALKPHOS 93 06/12/2020  ? BILITOT 0.7 06/12/2020  ? ?Lab Results  ?Component Value Date  ? CHOL 179 06/12/2020  ? HDL 68 06/12/2020  ? [place] 90 06/12/2020  ? TRIG 118 06/12/2020  ? CHOLHDL 2.6 06/12/2020  ?  ?No results found for: HGBA1C ? ?Review of Prior Studies: ?Carotid Artery Doppler Study 10/08/2021 ?Summary:  ?Right Carotid: Velocities in the right ICA are consistent with a 1-39%  ?stenosis.  ?Left Carotid: Velocities in the left ICA are consistent with a 1-39%  ?stenosis.   ?Vertebrals:  Bilateral vertebral arteries demonstrate antegrade flow.  ?Subclavians: Normal flow hemodynamics were seen in bilateral subclavian  ?             arteries.  ?Renal Artery Doppler Study:10/08/2021 ?Right

## 2021-10-17 ENCOUNTER — Ambulatory Visit: Payer: Medicare Other | Admitting: Adult Health

## 2021-10-17 ENCOUNTER — Encounter: Payer: Self-pay | Admitting: Adult Health

## 2021-10-17 VITALS — BP 140/68 | HR 94 | Ht 63.0 in | Wt 163.0 lb

## 2021-10-17 DIAGNOSIS — I4811 Longstanding persistent atrial fibrillation: Secondary | ICD-10-CM | POA: Diagnosis not present

## 2021-10-17 DIAGNOSIS — I10 Essential (primary) hypertension: Secondary | ICD-10-CM | POA: Diagnosis not present

## 2021-10-17 DIAGNOSIS — E7800 Pure hypercholesterolemia, unspecified: Secondary | ICD-10-CM

## 2021-10-17 NOTE — Patient Instructions (Signed)
Medication Instructions:  ?No Changes ?*If you need a refill on your cardiac medications before your next appointment, please call your pharmacy* ? ? ?Lab Work: ?No labs ?If you have labs (blood work) drawn today and your tests are completely normal, you will receive your results only by: ?MyChart Message (if you have MyChart) OR ?A paper copy in the mail ?If you have any lab test that is abnormal or we need to change your treatment, we will call you to review the results. ? ? ?Testing/Procedures: ?No Testing ? ? ?Follow-Up: ?At [facility], you and your health needs are our priority.  As part of our continuing mission to provide you with exceptional heart care, we have created designated Provider Care Teams.  These Care Teams include your primary Cardiologist (physician) and Advanced Practice Providers (APPs -  Physician Assistants and Nurse Practitioners) who all work together to provide you with the care you need, when you need it. ? ?We recommend signing up for the patient portal called "MyChart".  Sign up information is provided on this After Visit Summary.  MyChart is used to connect with patients for Virtual Visits (Telemedicine).  Patients are able to view lab/test results, encounter notes, upcoming appointments, etc.  Non-urgent messages can be sent to your provider as well.   ?To learn more about what you can do with MyChart, go to [email or web address].   ? ?Your next appointment:   ?9 month(s) ? ?The format for your next appointment:   ?In Person ? ?Provider:   ?Quay Burow, MD   ? ? ?  ?

## 2021-10-23 ENCOUNTER — Ambulatory Visit: Payer: Medicare Other | Admitting: *Deleted

## 2021-10-23 DIAGNOSIS — C44311 Basal cell carcinoma of skin of nose: Secondary | ICD-10-CM | POA: Diagnosis not present

## 2021-10-23 DIAGNOSIS — X32XXXD Exposure to sunlight, subsequent encounter: Secondary | ICD-10-CM | POA: Diagnosis not present

## 2021-10-23 DIAGNOSIS — Z5181 Encounter for therapeutic drug level monitoring: Secondary | ICD-10-CM

## 2021-10-23 DIAGNOSIS — B078 Other viral warts: Secondary | ICD-10-CM | POA: Diagnosis not present

## 2021-10-23 DIAGNOSIS — I482 Chronic atrial fibrillation, unspecified: Secondary | ICD-10-CM | POA: Diagnosis not present

## 2021-10-23 DIAGNOSIS — L570 Actinic keratosis: Secondary | ICD-10-CM | POA: Diagnosis not present

## 2021-10-23 DIAGNOSIS — L718 Other rosacea: Secondary | ICD-10-CM | POA: Diagnosis not present

## 2021-10-23 DIAGNOSIS — L821 Other seborrheic keratosis: Secondary | ICD-10-CM | POA: Diagnosis not present

## 2021-10-23 LAB — POCT INR: INR: 2.3 (ref 2.0–3.0)

## 2021-10-23 NOTE — Patient Instructions (Signed)
Continue warfarin 1/2 tablet daily.  Recheck INR in 6 weeks.  

## 2021-11-17 ENCOUNTER — Encounter (HOSPITAL_COMMUNITY): Payer: Self-pay | Admitting: Emergency Medicine

## 2021-11-17 ENCOUNTER — Emergency Department (HOSPITAL_COMMUNITY): Payer: Medicare Other

## 2021-11-17 ENCOUNTER — Emergency Department (HOSPITAL_COMMUNITY)
Admission: EM | Admit: 2021-11-17 | Discharge: 2021-11-18 | Disposition: A | Discharge status: Home / Self Care | Payer: Medicare Other | Attending: Emergency Medicine | Admitting: Emergency Medicine

## 2021-11-17 DIAGNOSIS — Z7902 Long term (current) use of antithrombotics/antiplatelets: Secondary | ICD-10-CM | POA: Insufficient documentation

## 2021-11-17 DIAGNOSIS — R079 Chest pain, unspecified: Secondary | ICD-10-CM | POA: Diagnosis not present

## 2021-11-17 DIAGNOSIS — E876 Hypokalemia: Secondary | ICD-10-CM | POA: Diagnosis not present

## 2021-11-17 DIAGNOSIS — Z79899 Other long term (current) drug therapy: Secondary | ICD-10-CM | POA: Insufficient documentation

## 2021-11-17 DIAGNOSIS — R072 Precordial pain: Secondary | ICD-10-CM | POA: Diagnosis not present

## 2021-11-17 DIAGNOSIS — I4891 Unspecified atrial fibrillation: Secondary | ICD-10-CM | POA: Insufficient documentation

## 2021-11-17 DIAGNOSIS — R0789 Other chest pain: Secondary | ICD-10-CM | POA: Diagnosis present

## 2021-11-17 DIAGNOSIS — I10 Essential (primary) hypertension: Secondary | ICD-10-CM | POA: Insufficient documentation

## 2021-11-17 LAB — TROPONIN I (HIGH SENSITIVITY): Troponin I (High Sensitivity): 4 ng/L (ref <18)

## 2021-11-17 LAB — CBC
HCT: 39.2 % (ref 36.0–46.0)
Hemoglobin: 12.7 g/dL (ref 12.0–15.0)
MCH: 30.4 pg (ref 26.0–34.0)
MCHC: 32.4 g/dL (ref 30.0–36.0)
MCV: 93.8 fL (ref 80.0–100.0)
Platelets: 231 10*3/uL (ref 150–400)
RBC: 4.18 MIL/uL (ref 3.87–5.11)
RDW: 14 % (ref 11.5–15.5)
WBC: 7.3 10*3/uL (ref 4.0–10.5)
nRBC: 0 % (ref 0.0–0.2)

## 2021-11-17 LAB — BASIC METABOLIC PANEL
Anion gap: 7 (ref 5–15)
BUN: 13 mg/dL (ref 8–23)
CO2: 30 mmol/L (ref 22–32)
Calcium: 9.1 mg/dL (ref 8.9–10.3)
Chloride: 102 mmol/L (ref 98–111)
Creatinine, Ser: 0.93 mg/dL (ref 0.44–1.00)
GFR, Estimated: >60 mL/min (ref >60)
Glucose, Bld: 113 mg/dL — ABNORMAL HIGH (ref 70–99)
Potassium: 3.3 mmol/L — ABNORMAL LOW (ref 3.5–5.1)
Sodium: 139 mmol/L (ref 135–145)

## 2021-11-17 NOTE — ED Triage Notes (Signed)
Pt c/o intermitted stabbing pain to left side of chest since this morning. Pt states tonight pt was sitting down watching TV when the pains came back but they were stronger.  ? ?Hx of Afib. ?

## 2021-11-17 NOTE — ED Notes (Signed)
No chest pain at this time 

## 2021-11-18 NOTE — Discharge Instructions (Signed)

## 2021-11-18 NOTE — ED Provider Notes (Signed)
?[facility] ?Provider Note ? ? ?CSN: [id number] ?Arrival date & time: 11/17/21  2136 ? ?  ? ?History ? ?Chief Complaint  ?Patient presents with  ? Chest Pain  ? ? ?Savannah Kirk is a 88 y.o. female. ? ?The history is provided by the patient.  ?Chest Pain ?Pain location:  L chest ?Pain quality: sharp   ?Pain radiates to:  Does not radiate ?Pain severity:  Moderate ?Onset quality:  Sudden ?Progression:  Resolved ?Chronicity:  New ?Associated symptoms: no fever, no numbness and no weakness   ? ?HPI: A 88 year old patient with a history of peripheral artery disease and hypertension presents for evaluation of chest pain. Initial onset of pain was approximately 1-3 hours ago. The patient's chest pain is well-localized, is sharp and is not worse with exertion. The patient's chest pain is middle- or left-sided, is not described as heaviness/pressure/tightness and does not radiate to the arms/jaw/neck. The patient does not complain of nausea and denies diaphoresis. The patient has no history of stroke, has not smoked in the past 90 days, denies any history of treated diabetes, has no relevant family history of coronary artery disease (first degree relative at less than age 13), has no history of hypercholesterolemia and does not have an elevated BMI (>=30).  ?Patient reports that earlier in the morning on May 8 she had brief chest pain.  It happened soon after waking up.  It is left-sided, it is sharp and takes her breath away briefly.  It lasts less than 1 minute and resolve spontaneously.  No other associated symptoms with that episode.  She checked her blood pressure at the time was 152/74 and a heart rate of 69.  She had no diaphoresis, no vomiting no focal weakness.  No ripping or tearing sensation into her back ?She went about her day and did gardening without any issue.  She had no new shortness of breath, fatigue or chest pain while gardening.  She is very active at baseline and has very few  limitations.  Later in the evening she began having similar type chest pain around 9:15 PM.  It was short and stabbing and lasted less than a minute.  She had no other associated symptoms at that time.  She is currently chest pain-free.  Denies known previous history of MI or PE ?Patient does have a history of atrial fibrillation and is on Coumadin ?Home Medications ?Prior to Admission medications   ?Medication Sig Start Date End Date Taking? Authorizing Provider  ?acetaminophen (TYLENOL) 500 MG tablet Take 500-1,000 mg by mouth every 6 (six) hours as needed (for pain.).    [provider]  ?diazepam (VALIUM) 5 MG tablet Take 5 mg by mouth at bedtime as needed for sedation.     [provider]  ?diltiazem (CARDIZEM CD) 120 MG 24 hr capsule Take 1 capsule (120 mg total) by mouth daily. ?Patient taking differently: Take 120 mg by mouth at bedtime. 09/13/18 10/17/21  Kirk, Savannah R, PA  ?doxazosin (CARDURA) 2 MG tablet Take 2 mg by mouth daily after lunch.  09/29/14   [provider]  ?hydrochlorothiazide (HYDRODIURIL) 25 MG tablet Take 25 mg by mouth daily.     [provider]  ?propranolol (INDERAL) 80 MG tablet Take 160 mg by mouth daily.    [provider]  ?warfarin (COUMADIN) 5 MG tablet TAKE ONE TABLET BY MOUTH ONCE DAILY. 07/15/21   Savannah Lenis, MD  ?   ? ?Allergies    ?  Aspirin and Penicillins   ? ?Review of Systems   ?Review of Systems  ?Constitutional:  Negative for fever.  ?Cardiovascular:  Positive for chest pain.  ?Neurological:  Negative for weakness and numbness.  ? ?Physical Exam ?Updated Vital Signs ?BP (!) 151/95   Pulse 82   Temp 97.8 ?F (36.6 ?C) (Oral)   Resp 18   Ht 1.6 m ('5\' 3"'$ )   Wt 73.9 kg   SpO2 98%   BMI 28.86 kg/m?  ?Physical Exam ?CONSTITUTIONAL: Elderly, no acute distress ?HEAD: Normocephalic/atraumatic ?EYES: EOMI/PERRL ?ENMT: Mucous membranes moist ?NECK: supple no meningeal signs ?SPINE/BACK:entire spine nontender ?CV: Irregular, no  loud murmurs ?LUNGS: Lungs are clear to auscultation bilaterally, no apparent distress ?ABDOMEN: soft, nontender, no rebound or guarding, bowel sounds noted throughout abdomen ?GU:no cva tenderness ?NEURO: Pt is awake/alert/appropriate, moves all extremitiesx4.  No facial droop.  No arm or leg drift ?EXTREMITIES: pulses normal/equalx4, full ROM, no lower extremity edema ?SKIN: warm, color normal ?PSYCH: no abnormalities of mood noted, alert and oriented to situation ? ?ED Results / Procedures / Treatments   ?Labs ?(all labs ordered are listed, but only abnormal results are displayed) ?Labs Reviewed  ?BASIC METABOLIC PANEL - Abnormal; Notable for the following components:  ?    Result Value  ? Potassium 3.3 (*)   ? Glucose, Bld 113 (*)   ? All other components within normal limits  ?CBC  ?TROPONIN I (HIGH SENSITIVITY)  ? ? ?EKG ?EKG Interpretation ? ?Date/Time:  Monday Nov 17 2021 21:53:54 EDT ?Ventricular Rate:  88 ?PR Interval:    ?QRS Duration: 148 ?QT Interval:  408 ?QTC Calculation: 493 ?R Axis:   79 ?Text Interpretation: Atrial fibrillation Right bundle branch block Abnormal ECG Confirmed by Ripley Fraise [phone]) on 11/17/2021 11:07:38 PM ? ?Radiology ?DG Chest 2 View ? ?Result Date: 11/17/2021 ?CLINICAL DATA:  Chest pain EXAM: CHEST - 2 VIEW COMPARISON:  08/14/2010 FINDINGS: Heart and mediastinal contours are within normal limits. No focal opacities or effusions. No acute bony abnormality. Aortic atherosclerosis. IMPRESSION: No active cardiopulmonary disease. Electronically Signed   By: Rolm Baptise M.D.   On: 11/17/2021 22:35   ? ?Procedures ?Procedures  ? ? ?Medications Ordered in ED ?Medications - No data to display ? ?ED Course/ Medical Decision Making/ A&P ?Clinical Course as of 11/18/21 0033  ?Tue Nov 18, 2021  ?0002 Potassium(!): 3.3 ?Mild hypokalemia [DW]  ?0028 Patient is requesting discharge home.  I advised that I have not completely ruled out MI or other acute causes but she would rather follow-up  as an outpatient [DW]  ?0028 We discussed tricked return precautions [DW]  ?0028 We will send message to her cardiologist as they had recommended CT coronary if she had any further chest [DW]  ?  ?Clinical Course User Index ?[DW] Ripley Fraise, MD  ? ?HEAR Score: 5                       ?Medical Decision Making ?Amount and/or Complexity of Data Reviewed ?Labs: ordered. Decision-making details documented in ED Course. ?Radiology: ordered. ? ? ?This patient presents to the ED for concern of chest pain, this involves an extensive number of treatment options, and is a complaint that carries with it a high risk of complications and morbidity.  The differential diagnosis includes but is not limited to acute coronary syndrome, pulmonary embolism, pneumonia, pneumothorax, aortic dissection, pericarditis ? ?Comorbidities that complicate the patient evaluation: ?Patient?s presentation is complicated by their history of  atrial fibrillation ? ?Additional history obtained: ?Records reviewed  cardiology notes reviewed ? ?Lab Tests: ?I Ordered, and personally interpreted labs.  The pertinent results include: Mild hypokalemia ? ?Imaging Studies ordered: ?I ordered imaging studies including X-ray chest   ?I independently visualized and interpreted imaging which showed no acute finding ?I agree with the radiologist interpretation ? ?Cardiac Monitoring: ?The patient was maintained on a cardiac monitor.  I personally viewed and interpreted the cardiac monitor which showed an underlying rhythm of:  Atrial Fibrillation ? ?Reevaluation: ?After the interventions noted above, I reevaluated the patient and found that they have :improved ? ?Complexity of problems addressed: ?Patient?s presentation is most consistent with  acute presentation with potential threat to life or bodily function ? ?Disposition: ?After consideration of the diagnostic results and the patient?s response to treatment,  ?I feel that the patent would benefit from  discharge   .  ? ? ? ? ? ? ? ? ? ?Final Clinical Impression(s) / ED Diagnoses ?Final diagnoses:  ?Precordial pain  ? ? ?Rx / DC Orders ?ED Discharge Orders   ? ? None  ? ?  ? ? ?  ?Ripley Fraise, MD ?11/18/21 0998 ? ?

## 2021-11-21 ENCOUNTER — Other Ambulatory Visit: Payer: Self-pay

## 2021-11-21 DIAGNOSIS — R079 Chest pain, unspecified: Secondary | ICD-10-CM

## 2021-12-04 ENCOUNTER — Ambulatory Visit: Payer: Medicare Other | Admitting: *Deleted

## 2021-12-04 DIAGNOSIS — I482 Chronic atrial fibrillation, unspecified: Secondary | ICD-10-CM | POA: Diagnosis not present

## 2021-12-04 DIAGNOSIS — Z5181 Encounter for therapeutic drug level monitoring: Secondary | ICD-10-CM | POA: Diagnosis not present

## 2021-12-04 LAB — POCT INR: INR: 1.9 — AB (ref 2.0–3.0)

## 2021-12-04 NOTE — Patient Instructions (Signed)
Description   Continue warfarin 1/2 tablet daily.  Recheck INR in 4 weeks. Pt request

## 2021-12-09 ENCOUNTER — Other Ambulatory Visit: Payer: Self-pay | Admitting: Cardiology

## 2021-12-09 NOTE — Telephone Encounter (Signed)
Received refill request for warfarin:  Last INR was 1.9 on 12/04/21 Next INE due 01/05/22 LOV was 10/17/21  Arnold Long NP  Refill approved

## 2021-12-11 DIAGNOSIS — C44311 Basal cell carcinoma of skin of nose: Secondary | ICD-10-CM | POA: Diagnosis not present

## 2021-12-11 DIAGNOSIS — L280 Lichen simplex chronicus: Secondary | ICD-10-CM | POA: Diagnosis not present

## 2022-01-05 DIAGNOSIS — Z961 Presence of intraocular lens: Secondary | ICD-10-CM | POA: Diagnosis not present

## 2022-01-05 DIAGNOSIS — H353131 Nonexudative age-related macular degeneration, bilateral, early dry stage: Secondary | ICD-10-CM | POA: Diagnosis not present

## 2022-01-05 DIAGNOSIS — H5213 Myopia, bilateral: Secondary | ICD-10-CM | POA: Diagnosis not present

## 2022-01-05 DIAGNOSIS — H52203 Unspecified astigmatism, bilateral: Secondary | ICD-10-CM | POA: Diagnosis not present

## 2022-01-05 DIAGNOSIS — H524 Presbyopia: Secondary | ICD-10-CM | POA: Diagnosis not present

## 2022-01-07 ENCOUNTER — Ambulatory Visit: Payer: Medicare Other | Admitting: *Deleted

## 2022-01-07 DIAGNOSIS — Z5181 Encounter for therapeutic drug level monitoring: Secondary | ICD-10-CM | POA: Diagnosis not present

## 2022-01-07 DIAGNOSIS — I482 Chronic atrial fibrillation, unspecified: Secondary | ICD-10-CM

## 2022-01-07 LAB — POCT INR: INR: 2 (ref 2.0–3.0)

## 2022-01-07 NOTE — Patient Instructions (Signed)
Continue warfarin 1/2 tablet daily.  Recheck INR in 5 weeks.

## 2022-01-22 DIAGNOSIS — C44311 Basal cell carcinoma of skin of nose: Secondary | ICD-10-CM | POA: Diagnosis not present

## 2022-02-17 ENCOUNTER — Ambulatory Visit: Payer: Medicare Other | Admitting: *Deleted

## 2022-02-17 DIAGNOSIS — Z5181 Encounter for therapeutic drug level monitoring: Secondary | ICD-10-CM | POA: Diagnosis not present

## 2022-02-17 DIAGNOSIS — I482 Chronic atrial fibrillation, unspecified: Secondary | ICD-10-CM | POA: Diagnosis not present

## 2022-02-17 LAB — POCT INR: INR: 2.5 (ref 2.0–3.0)

## 2022-02-17 NOTE — Patient Instructions (Signed)
Continue warfarin 1/2 tablet daily.  Recheck INR in 6 weeks.  

## 2022-02-27 ENCOUNTER — Telehealth (HOSPITAL_COMMUNITY): Payer: Self-pay

## 2022-02-27 NOTE — Telephone Encounter (Signed)
Patient called and states she is concerned about redness within her skin. She thinks it is coming from taking Diltiazem. Per Tilda Franco this medication can cause vasodilation within the skin. Patient is scheduled for appointment on 9/7 @ 3:00pm to discuss changing her medication. Patient given parking code for September and directions to the [facility]. Communicated with patient and she verbalized understanding.

## 2022-03-05 DIAGNOSIS — Z08 Encounter for follow-up examination after completed treatment for malignant neoplasm: Secondary | ICD-10-CM | POA: Diagnosis not present

## 2022-03-05 DIAGNOSIS — Z85828 Personal history of other malignant neoplasm of skin: Secondary | ICD-10-CM | POA: Diagnosis not present

## 2022-03-19 ENCOUNTER — Encounter (HOSPITAL_COMMUNITY): Payer: Self-pay | Admitting: Physician Assistant

## 2022-03-19 ENCOUNTER — Ambulatory Visit (HOSPITAL_COMMUNITY)
Admission: RE | Admit: 2022-03-19 | Discharge: 2022-03-19 | Disposition: A | Discharge status: Home / Self Care | Payer: Medicare Other | Source: Ambulatory Visit | Attending: Physician Assistant | Admitting: Physician Assistant

## 2022-03-19 VITALS — BP 150/80 | HR 83 | Ht 63.0 in | Wt 166.6 lb

## 2022-03-19 DIAGNOSIS — E785 Hyperlipidemia, unspecified: Secondary | ICD-10-CM | POA: Insufficient documentation

## 2022-03-19 DIAGNOSIS — D6869 Other thrombophilia: Secondary | ICD-10-CM | POA: Diagnosis not present

## 2022-03-19 DIAGNOSIS — I4821 Permanent atrial fibrillation: Secondary | ICD-10-CM

## 2022-03-19 DIAGNOSIS — I10 Essential (primary) hypertension: Secondary | ICD-10-CM | POA: Diagnosis not present

## 2022-03-19 DIAGNOSIS — Z7901 Long term (current) use of anticoagulants: Secondary | ICD-10-CM | POA: Insufficient documentation

## 2022-03-19 DIAGNOSIS — I4811 Longstanding persistent atrial fibrillation: Secondary | ICD-10-CM | POA: Diagnosis not present

## 2022-03-19 MED ORDER — PROPRANOLOL HCL 80 MG PO TABS: ORAL_TABLET | ORAL | 3 refills | Status: DC

## 2022-03-19 NOTE — Progress Notes (Signed)
Primary Care Physician: Asencion Noble, MD Primary Cardiologist: Dr Gwenlyn Found Referring Physician: Dr Toy Care is a 88 y.o. female with a history of renal artery stenosis, HTN, HLD, carotid artery disease s/p endarterectomy 09/2019, and persistent atrial fibrillation who presents for follow up in the [facility].  The patient was initially diagnosed with atrial fibrillation after presenting to her PCP with elevated heart rates. ECG showed atrial fibrillation and her propranolol was changed to atenolol. She noted more SOB and fatigue and she was switched back to propranolol and her symptoms resolved. She opted for a conservative rate control strategy and she has been maintained on propranolol and diltiazem.   On follow up today, patient reports that she has done well from an afib standpoint. She has been well rate controlled, anticoagulated with warfarin. However, she has developed rosacea and is concerned that diltiazem could be contributing. She follows with dermatology.    Today, she denies symptoms of palpitations, chest pain, shortness of breath, orthopnea, PND, lower extremity edema, dizziness, presyncope, syncope, snoring, daytime somnolence, bleeding, or neurologic sequela. The patient is tolerating medications without difficulties and is otherwise without complaint today.    Atrial Fibrillation Risk Factors:  she does not have symptoms or diagnosis of sleep apnea. she does not have a history of rheumatic fever. she does not have a history of alcohol use. The patient does have a history of early familial atrial fibrillation or other arrhythmias. Daughter also has afib.  she has a BMI of Body mass index is 29.51 kg/m.Marland Kitchen Filed Weights   03/19/22 1528  Weight: 75.6 kg    Family History  Problem Relation Age of Onset   Hypertension Mother    Hypertension Sister    Diabetes Sister      Atrial Fibrillation Management history:  Previous  antiarrhythmic drugs: none Previous cardioversions: none Previous ablations: none CHADS2VASC score: 5  Anticoagulation history: Eliquis, warfarin    Past Medical History:  Diagnosis Date   Arthritis    Atrial fibrillation (HCC)    Cervical disc disease    Dyspnea    on exertion   History of kidney stones    Hyperlipidemia    Hypertension    Nosebleed    RBBB (right bundle branch block)    Renal artery stenosis (HCC)    Stenosis of carotid artery    Past Surgical History:  Procedure Laterality Date   BACK SURGERY     lumbar interbody fusion   Cardiolite  12/205   negative   CAROTID ENDARTERECTOMY Left 10/06/2019   CAROTID STENT  07-06-04   CATARACT EXTRACTION     DOPPLER ECHOCARDIOGRAPHY     normal 2D. borderline concentric LVH   ENDARTERECTOMY Left 10/06/2019   Procedure: LEFT ENDARTERECTOMY CAROTID;  Surgeon: Serafina Mitchell, MD;  Location: [facility] OR;  Service: Vascular;  Laterality: Left;   kidney stent     PATCH ANGIOPLASTY Left 10/06/2019   Procedure: Patch Angioplasty using a Xenosure Biologic Patch;  Surgeon: Serafina Mitchell, MD;  Location: [facility] OR;  Service: Vascular;  Laterality: Left;   renal doppler  08/04/2006   patent right renal artery stent   TONSILLECTOMY      Current Outpatient Medications  Medication Sig Dispense Refill   acetaminophen (TYLENOL) 500 MG tablet Take 500-1,000 mg by mouth every 6 (six) hours as needed (for pain.).     diazepam (VALIUM) 5 MG tablet Take 5 mg by mouth at bedtime as needed  for sedation.      diltiazem (CARDIZEM CD) 120 MG 24 hr capsule Take 1 capsule (120 mg total) by mouth daily. 90 capsule 3   doxazosin (CARDURA) 2 MG tablet Take 2 mg by mouth daily after lunch.      hydrochlorothiazide (HYDRODIURIL) 25 MG tablet Take 25 mg by mouth daily.      propranolol (INDERAL) 80 MG tablet Take 160 mg by mouth daily.     warfarin (COUMADIN) 5 MG tablet Take 1/2 - 1 tablet daily or as directed by coumadin clinic 90 tablet 1   Azelaic  Acid 15 % gel Apply topically 2 (two) times daily as needed.     No current facility-administered medications for this encounter.    Allergies  Allergen Reactions   Aspirin Other (See Comments)    Nose bleeds   Penicillins Other (See Comments)    Skins comes off tongue  Did it involve swelling of the face/tongue/throat, SOB, or low BP? No Did it involve sudden or severe rash/hives, skin peeling, or any reaction on the inside of your mouth or nose? No Did you need to seek medical attention at a hospital or doctor's office? Yes When did it last happen?     62-32 years old  If all above answers are "NO", may proceed with cephalosporin use.     Social History   Socioeconomic History   Marital status: Married    Spouse name: Not on file   Number of children: Not on file   Years of education: Not on file   Highest education level: Not on file  Occupational History   Not on file  Tobacco Use   Smoking status: Former    Types: Cigarettes    Quit date: 06/14/1985    Years since quitting: 36.7   Smokeless tobacco: Never   Tobacco comments:    Former smoker 03/19/22  Vaping Use   Vaping Use: Never used  Substance and Sexual Activity   Alcohol use: No   Drug use: No   Sexual activity: Not on file  Other Topics Concern   Not on file  Social History Narrative   Not on file   Social Determinants of Health   Financial Resource Strain: Not on file  Food Insecurity: Not on file  Transportation Needs: Not on file  Physical Activity: Not on file  Stress: Not on file  Social Connections: Not on file  Intimate Partner Violence: Not on file     ROS- All systems are reviewed and negative except as per the HPI above.  Physical Exam: Vitals:   03/19/22 1528  BP: (!) 150/80  Pulse: 83  Weight: 75.6 kg  Height: '5\' 3"'$  (1.6 m)     GEN- The patient is a well appearing elderly female, alert and oriented x 3 today.   HEENT-head normocephalic, atraumatic, sclera clear,  conjunctiva pink, hearing intact, trachea midline. Lungs- Clear to ausculation bilaterally, normal work of breathing Heart- irregular rate and rhythm, no murmurs, rubs or gallops  GI- soft, NT, ND, + BS Extremities- no clubbing, cyanosis, or edema MS- no significant deformity or atrophy Skin- no rash or lesion Psych- euthymic mood, full affect Neuro- strength and sensation are intact   Wt Readings from Last 3 Encounters:  03/19/22 75.6 kg  11/17/21 73.9 kg  10/17/21 73.9 kg    EKG today demonstrates  Afib, RBBB Vent. rate 83 BPM PR interval * ms QRS duration 144 ms QT/QTcB 428/502 ms  Echo 08/12/18 demonstrated  1. The left ventricle has normal systolic function of 76-28%. The cavity size is normal. There is no left ventricular wall thickness. Echo evidence of indeterminate diastolic filling patterns. Elevated left ventricular end-diastolic pressure. The left ventricular diastology could not be evaluatedsecondary to atrial fibrillation.  2. Severely dilated left atrial size.  3. Moderately dilated right atrial size.  4. Normal tricuspid valve.  5. Tricuspid regurgitation is mild.  6. The aortic valve tricuspid. There is mild thickening and sclerosis without any evidence of stenosis of the aortic valve.  7. The aortic root is normal is size and structure.  8. The inferior vena cava was dilated in size with >50% respiratory variablity.  9. No atrial level shunt detected by color flow Doppler. LA 45 mm    Epic records are reviewed at length today   CHA2DS2-VASc Score = 5  The patient's score is based upon: CHF History: 0 HTN History: 1 Diabetes History: 0 Stroke History: 0 Vascular Disease History: 1 Age Score: 2 Gender Score: 1       ASSESSMENT AND PLAN: 1. Permanent Atrial Fibrillation (ICD10:  I48.11) The patient's CHA2DS2-VASc score is 5, indicating a 7.2% annual risk of stroke.   Patient in rate controlled afib. We discussed alternate rate controlling  medications.  Will discontinue diltiazem and increase propranolol to 160 mg AM and 80 mg PM. If this fails to control her heart rate or her rosacea worsens, will resume diltiazem.  Continue warfarin  2. Secondary Hypercoagulable State (ICD10:  D68.69) The patient is at significant risk for stroke/thromboembolism based upon her CHA2DS2-VASc Score of 5.  Continue Warfarin (Coumadin).   3. HTN Stable, med changes as above.   Follow up in the AF clinic in one month.    [facility] [address] [place], [place] [address] [phone] 03/19/2022 3:37 PM

## 2022-03-19 NOTE — Patient Instructions (Signed)
Increase propranolol to 2 tablets ('160mg'$ ) in the AM and 1 tablet ('80mg'$ ) in the PM    STOP diltiazem (cardizem)

## 2022-03-25 DIAGNOSIS — M5136 Other intervertebral disc degeneration, lumbar region: Secondary | ICD-10-CM | POA: Diagnosis not present

## 2022-03-25 DIAGNOSIS — I10 Essential (primary) hypertension: Secondary | ICD-10-CM | POA: Diagnosis not present

## 2022-03-25 DIAGNOSIS — Z23 Encounter for immunization: Secondary | ICD-10-CM | POA: Diagnosis not present

## 2022-03-25 DIAGNOSIS — I480 Paroxysmal atrial fibrillation: Secondary | ICD-10-CM | POA: Diagnosis not present

## 2022-03-31 ENCOUNTER — Ambulatory Visit: Payer: Medicare Other | Attending: Cardiovascular Disease | Admitting: *Deleted

## 2022-03-31 DIAGNOSIS — I482 Chronic atrial fibrillation, unspecified: Secondary | ICD-10-CM | POA: Diagnosis not present

## 2022-03-31 DIAGNOSIS — Z5181 Encounter for therapeutic drug level monitoring: Secondary | ICD-10-CM | POA: Diagnosis not present

## 2022-03-31 LAB — POCT INR: INR: 2.4 (ref 2.0–3.0)

## 2022-03-31 NOTE — Patient Instructions (Signed)
Continue warfarin 1/2 tablet daily.  Recheck INR in 6 weeks.  

## 2022-04-20 ENCOUNTER — Encounter (HOSPITAL_COMMUNITY): Payer: Self-pay | Admitting: Nurse Practitioner

## 2022-04-20 ENCOUNTER — Ambulatory Visit (HOSPITAL_COMMUNITY)
Admission: RE | Admit: 2022-04-20 | Discharge: 2022-04-20 | Disposition: A | Discharge status: Home / Self Care | Payer: Medicare Other | Source: Ambulatory Visit | Attending: Physician Assistant | Admitting: Physician Assistant

## 2022-04-20 VITALS — BP 150/90 | HR 88 | Wt 167.0 lb

## 2022-04-20 DIAGNOSIS — I4821 Permanent atrial fibrillation: Secondary | ICD-10-CM

## 2022-04-20 DIAGNOSIS — D6869 Other thrombophilia: Secondary | ICD-10-CM

## 2022-04-20 DIAGNOSIS — I701 Atherosclerosis of renal artery: Secondary | ICD-10-CM | POA: Insufficient documentation

## 2022-04-20 DIAGNOSIS — E785 Hyperlipidemia, unspecified: Secondary | ICD-10-CM | POA: Insufficient documentation

## 2022-04-20 DIAGNOSIS — Z7901 Long term (current) use of anticoagulants: Secondary | ICD-10-CM | POA: Insufficient documentation

## 2022-04-20 DIAGNOSIS — I10 Essential (primary) hypertension: Secondary | ICD-10-CM | POA: Diagnosis not present

## 2022-04-20 NOTE — Progress Notes (Signed)
Primary Care Physician: Asencion Noble, MD Primary Cardiologist: Dr Gwenlyn Found Referring Physician: Dr Toy Care is a 88 y.o. female with a history of renal artery stenosis, HTN, HLD, carotid artery disease s/p endarterectomy 09/2019, and persistent atrial fibrillation who presents for follow up in the [facility].  The patient was initially diagnosed with atrial fibrillation after presenting to her PCP with elevated heart rates. ECG showed atrial fibrillation and her propranolol was changed to atenolol. She noted more SOB and fatigue and she was switched back to propranolol and her symptoms resolved. She opted for a conservative rate control strategy and she has been maintained on propranolol and diltiazem.   On follow up today, patient reports that she has done well from an afib standpoint. She has been well rate controlled, anticoagulated with warfarin. However, she has developed rosacea and is concerned that diltiazem could be contributing. She follows with dermatology.    F/u in afib clinic, 04/20/22. She is nicely rate controlled stopping diltiazem and continuing on higher dose of propanolol. She does not feel that her rosacea has improved off CCB.   Today, she denies symptoms of palpitations, chest pain, shortness of breath, orthopnea, PND, lower extremity edema, dizziness, presyncope, syncope, snoring, daytime somnolence, bleeding, or neurologic sequela. The patient is tolerating medications without difficulties and is otherwise without complaint today.    Atrial Fibrillation Risk Factors:  she does not have symptoms or diagnosis of sleep apnea. she does not have a history of rheumatic fever. she does not have a history of alcohol use. The patient does have a history of early familial atrial fibrillation or other arrhythmias. Daughter also has afib.  she has a BMI of Body mass index is 29.58 kg/m.Marland Kitchen Filed Weights   04/20/22 0954  Weight: 75.8 kg     Family History  Problem Relation Age of Onset   Hypertension Mother    Hypertension Sister    Diabetes Sister      Atrial Fibrillation Management history:  Previous antiarrhythmic drugs: none Previous cardioversions: none Previous ablations: none CHADS2VASC score: 5  Anticoagulation history: Eliquis, warfarin    Past Medical History:  Diagnosis Date   Arthritis    Atrial fibrillation (HCC)    Cervical disc disease    Dyspnea    on exertion   History of kidney stones    Hyperlipidemia    Hypertension    Nosebleed    RBBB (right bundle branch block)    Renal artery stenosis (HCC)    Stenosis of carotid artery    Past Surgical History:  Procedure Laterality Date   BACK SURGERY     lumbar interbody fusion   Cardiolite  12/205   negative   CAROTID ENDARTERECTOMY Left 10/06/2019   CAROTID STENT  07-06-04   CATARACT EXTRACTION     DOPPLER ECHOCARDIOGRAPHY     normal 2D. borderline concentric LVH   ENDARTERECTOMY Left 10/06/2019   Procedure: LEFT ENDARTERECTOMY CAROTID;  Surgeon: Serafina Mitchell, MD;  Location: [facility] OR;  Service: Vascular;  Laterality: Left;   kidney stent     PATCH ANGIOPLASTY Left 10/06/2019   Procedure: Patch Angioplasty using a Xenosure Biologic Patch;  Surgeon: Serafina Mitchell, MD;  Location: [facility] OR;  Service: Vascular;  Laterality: Left;   renal doppler  08/04/2006   patent right renal artery stent   TONSILLECTOMY      Current Outpatient Medications  Medication Sig Dispense Refill   acetaminophen (TYLENOL) 500 MG  tablet Take 500-1,000 mg by mouth every 6 (six) hours as needed (for pain.).     Azelaic Acid 15 % gel Apply topically 2 (two) times daily as needed.     diazepam (VALIUM) 5 MG tablet Take 5 mg by mouth at bedtime as needed for sedation.      doxazosin (CARDURA) 2 MG tablet Take 2 mg by mouth daily after lunch.      hydrochlorothiazide (HYDRODIURIL) 25 MG tablet Take 25 mg by mouth daily.      propranolol (INDERAL) 80 MG tablet  Take 2 tablets in the AM and 1 tablet in the PM 90 tablet 3   warfarin (COUMADIN) 5 MG tablet Take 1/2 - 1 tablet daily or as directed by coumadin clinic 90 tablet 1   No current facility-administered medications for this encounter.    Allergies  Allergen Reactions   Aspirin Other (See Comments)    Nose bleeds   Penicillins Other (See Comments)    Skins comes off tongue  Did it involve swelling of the face/tongue/throat, SOB, or low BP? No Did it involve sudden or severe rash/hives, skin peeling, or any reaction on the inside of your mouth or nose? No Did you need to seek medical attention at a hospital or doctor's office? Yes When did it last happen?     65-69 years old  If all above answers are "NO", may proceed with cephalosporin use.     Social History   Socioeconomic History   Marital status: Married    Spouse name: Not on file   Number of children: Not on file   Years of education: Not on file   Highest education level: Not on file  Occupational History   Not on file  Tobacco Use   Smoking status: Former    Types: Cigarettes    Quit date: 06/14/1985    Years since quitting: 36.8   Smokeless tobacco: Never   Tobacco comments:    Former smoker 03/19/22  Vaping Use   Vaping Use: Never used  Substance and Sexual Activity   Alcohol use: No   Drug use: No   Sexual activity: Not on file  Other Topics Concern   Not on file  Social History Narrative   Not on file   Social Determinants of Health   Financial Resource Strain: Not on file  Food Insecurity: Not on file  Transportation Needs: Not on file  Physical Activity: Not on file  Stress: Not on file  Social Connections: Not on file  Intimate Partner Violence: Not on file     ROS- All systems are reviewed and negative except as per the HPI above.  Physical Exam: Vitals:   04/20/22 0954  BP: (!) 150/90  Pulse: 88  Weight: 75.8 kg     GEN- The patient is a well appearing elderly female, alert and  oriented x 3 today.   HEENT-head normocephalic, atraumatic, sclera clear, conjunctiva pink, hearing intact, trachea midline. Lungs- Clear to ausculation bilaterally, normal work of breathing Heart- irregular rate and rhythm, no murmurs, rubs or gallops  GI- soft, NT, ND, + BS Extremities- no clubbing, cyanosis, or edema MS- no significant deformity or atrophy Skin- no rash or lesion Psych- euthymic mood, full affect Neuro- strength and sensation are intact   Wt Readings from Last 3 Encounters:  04/20/22 75.8 kg  03/19/22 75.6 kg  11/17/21 73.9 kg       Echo 08/12/18 demonstrated  1. The left ventricle has normal systolic  function of 55-60%. The cavity size is normal. There is no left ventricular wall thickness. Echo evidence of indeterminate diastolic filling patterns. Elevated left ventricular end-diastolic pressure. The left ventricular diastology could not be evaluatedsecondary to atrial fibrillation.  2. Severely dilated left atrial size.  3. Moderately dilated right atrial size.  4. Normal tricuspid valve.  5. Tricuspid regurgitation is mild.  6. The aortic valve tricuspid. There is mild thickening and sclerosis without any evidence of stenosis of the aortic valve.  7. The aortic root is normal is size and structure.  8. The inferior vena cava was dilated in size with >50% respiratory variablity.  9. No atrial level shunt detected by color flow Doppler. LA 45 mm  Ekg- Vent. rate 88 BPM PR interval * ms QRS duration 136 ms QT/QTcB 396/479 ms P-R-T axes * 72 -42 Atrial fibrillation Right bundle branch block Septal infarct , age undetermined Abnormal ECG When compared with ECG of 19-Mar-2022 15:30, PREVIOUS ECG IS PRESENT  Epic records are reviewed at length today   CHA2DS2-VASc Score = 5  The patient's score is based upon: CHF History: 0 HTN History: 1 Diabetes History: 0 Stroke History: 0 Vascular Disease History: 1 Age Score: 2 Gender Score: 1        ASSESSMENT AND PLAN: 1. Permanent Atrial Fibrillation (ICD10:  I48.11) The patient's CHA2DS2-VASc score is 5, indicating a 7.2% annual risk of stroke.   Patient in rate controlled afib.  Diltiazem  was discontinued and increased propranolol to 160 mg AM and 80 mg PM. She is rate controlled with this but not sure if rosacea has improved off diltaizem.  Continue warfarin  2. Secondary Hypercoagulable State (ICD10:  D68.69) The patient is at significant risk for stroke/thromboembolism based upon her CHA2DS2-VASc Score of 5.  Continue Warfarin (Coumadin).   3. HTN Stable, med changes as above.   Follow up with Dr. Gwenlyn Found the first part of the year per recall  Afib clinic as needed   Butch Penny C. Tichina Koebel, [facility] [address] [place], [place] [address] [phone]

## 2022-04-22 DIAGNOSIS — Z23 Encounter for immunization: Secondary | ICD-10-CM | POA: Diagnosis not present

## 2022-05-14 ENCOUNTER — Ambulatory Visit: Payer: Medicare Other | Attending: Cardiovascular Disease | Admitting: *Deleted

## 2022-05-14 DIAGNOSIS — Z5181 Encounter for therapeutic drug level monitoring: Secondary | ICD-10-CM

## 2022-05-14 DIAGNOSIS — I482 Chronic atrial fibrillation, unspecified: Secondary | ICD-10-CM | POA: Diagnosis not present

## 2022-05-14 LAB — POCT INR: INR: 2.4 (ref 2.0–3.0)

## 2022-05-14 NOTE — Patient Instructions (Signed)
Continue warfarin 1/2 tablet daily.  Recheck INR in 6 weeks.  

## 2022-05-20 DIAGNOSIS — Z23 Encounter for immunization: Secondary | ICD-10-CM | POA: Diagnosis not present

## 2022-06-08 ENCOUNTER — Other Ambulatory Visit: Payer: Self-pay | Admitting: Cardiology

## 2022-06-08 NOTE — Telephone Encounter (Signed)
Refill request for warfarin:  Last INR was 2.4 on 05/14/22 Next INR due 06/25/22 LOV was 10/17/21  Arnold Long NP  Refill approved.

## 2022-06-25 ENCOUNTER — Ambulatory Visit: Payer: Medicare Other | Attending: Cardiovascular Disease | Admitting: *Deleted

## 2022-06-25 ENCOUNTER — Other Ambulatory Visit (HOSPITAL_COMMUNITY): Payer: Self-pay | Admitting: *Deleted

## 2022-06-25 DIAGNOSIS — Z5181 Encounter for therapeutic drug level monitoring: Secondary | ICD-10-CM | POA: Diagnosis not present

## 2022-06-25 DIAGNOSIS — I482 Chronic atrial fibrillation, unspecified: Secondary | ICD-10-CM | POA: Diagnosis not present

## 2022-06-25 LAB — POCT INR: INR: 2.4 (ref 2.0–3.0)

## 2022-06-25 MED ORDER — PROPRANOLOL HCL ER 80 MG PO CP24
ORAL_CAPSULE | ORAL | Status: DC
Start: 2022-06-25 — End: 2023-01-13

## 2022-06-25 NOTE — Patient Instructions (Signed)
Continue warfarin 1/2 tablet daily.  Recheck INR in 6 weeks.  

## 2022-08-06 ENCOUNTER — Ambulatory Visit: Payer: Medicare Other | Attending: Cardiovascular Disease | Admitting: *Deleted

## 2022-08-06 DIAGNOSIS — I482 Chronic atrial fibrillation, unspecified: Secondary | ICD-10-CM | POA: Diagnosis not present

## 2022-08-06 DIAGNOSIS — Z5181 Encounter for therapeutic drug level monitoring: Secondary | ICD-10-CM | POA: Diagnosis not present

## 2022-08-06 LAB — POCT INR: INR: 2 (ref 2.0–3.0)

## 2022-08-06 NOTE — Patient Instructions (Signed)
Continue warfarin 1/2 tablet daily.  Recheck INR in 6 weeks.  

## 2022-08-20 ENCOUNTER — Encounter (HOSPITAL_COMMUNITY): Payer: Self-pay | Admitting: *Deleted

## 2022-08-24 DIAGNOSIS — I10 Essential (primary) hypertension: Secondary | ICD-10-CM | POA: Diagnosis not present

## 2022-08-24 DIAGNOSIS — Z79899 Other long term (current) drug therapy: Secondary | ICD-10-CM | POA: Diagnosis not present

## 2022-08-24 DIAGNOSIS — I482 Chronic atrial fibrillation, unspecified: Secondary | ICD-10-CM | POA: Diagnosis not present

## 2022-08-24 DIAGNOSIS — I2510 Atherosclerotic heart disease of native coronary artery without angina pectoris: Secondary | ICD-10-CM | POA: Diagnosis not present

## 2022-08-24 DIAGNOSIS — E785 Hyperlipidemia, unspecified: Secondary | ICD-10-CM | POA: Diagnosis not present

## 2022-08-31 DIAGNOSIS — I779 Disorder of arteries and arterioles, unspecified: Secondary | ICD-10-CM | POA: Diagnosis not present

## 2022-08-31 DIAGNOSIS — I480 Paroxysmal atrial fibrillation: Secondary | ICD-10-CM | POA: Diagnosis not present

## 2022-08-31 DIAGNOSIS — Q271 Congenital renal artery stenosis: Secondary | ICD-10-CM | POA: Diagnosis not present

## 2022-08-31 DIAGNOSIS — E785 Hyperlipidemia, unspecified: Secondary | ICD-10-CM | POA: Diagnosis not present

## 2022-08-31 DIAGNOSIS — I10 Essential (primary) hypertension: Secondary | ICD-10-CM | POA: Diagnosis not present

## 2022-09-14 IMAGING — DX DG CHEST 2V
2 series · 2 of 2 positions shown · non-contrast
Comparison: 08/14/2010

CLINICAL DATA: Chest pain

EXAM:
CHEST - 2 VIEW

[chest pa]
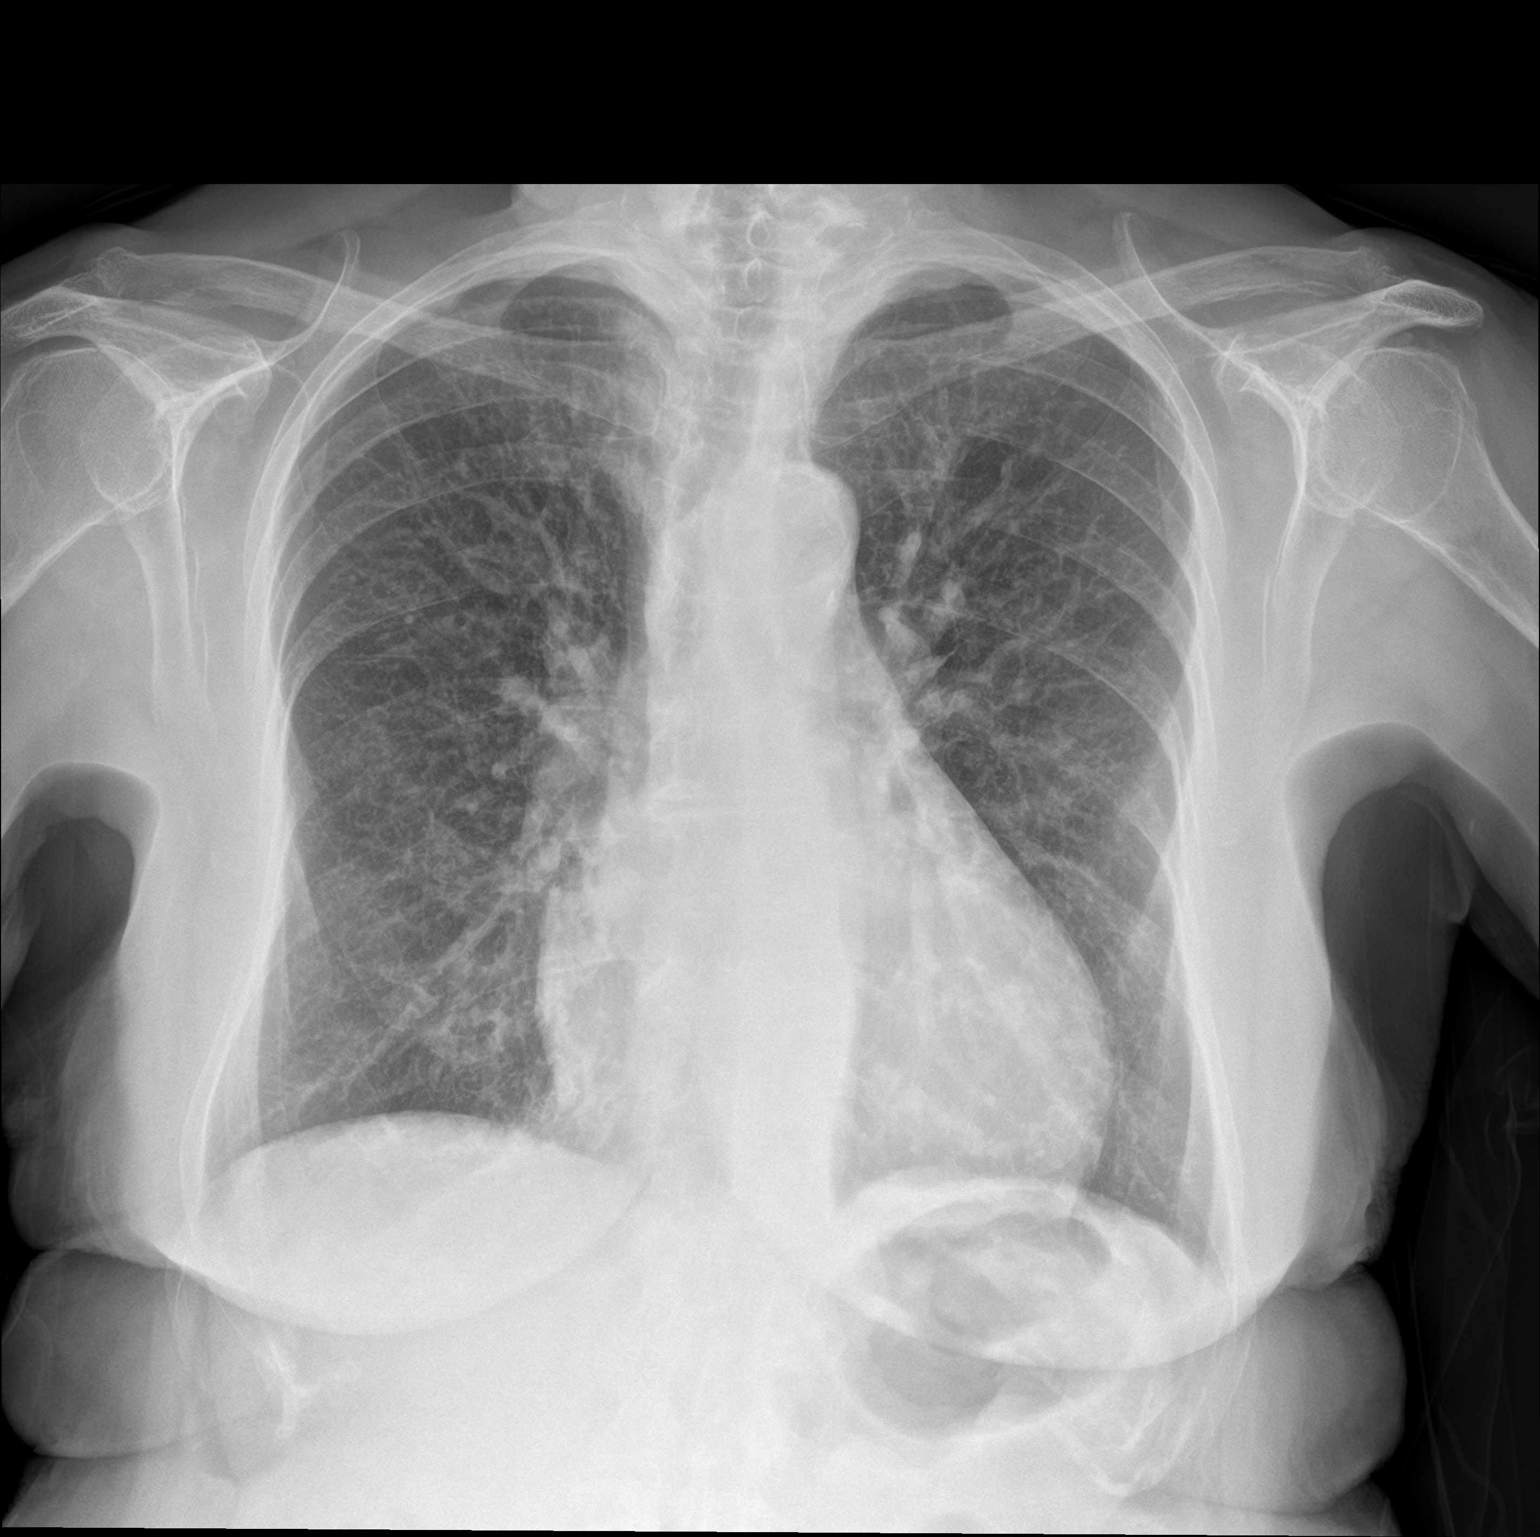

[chest lat]
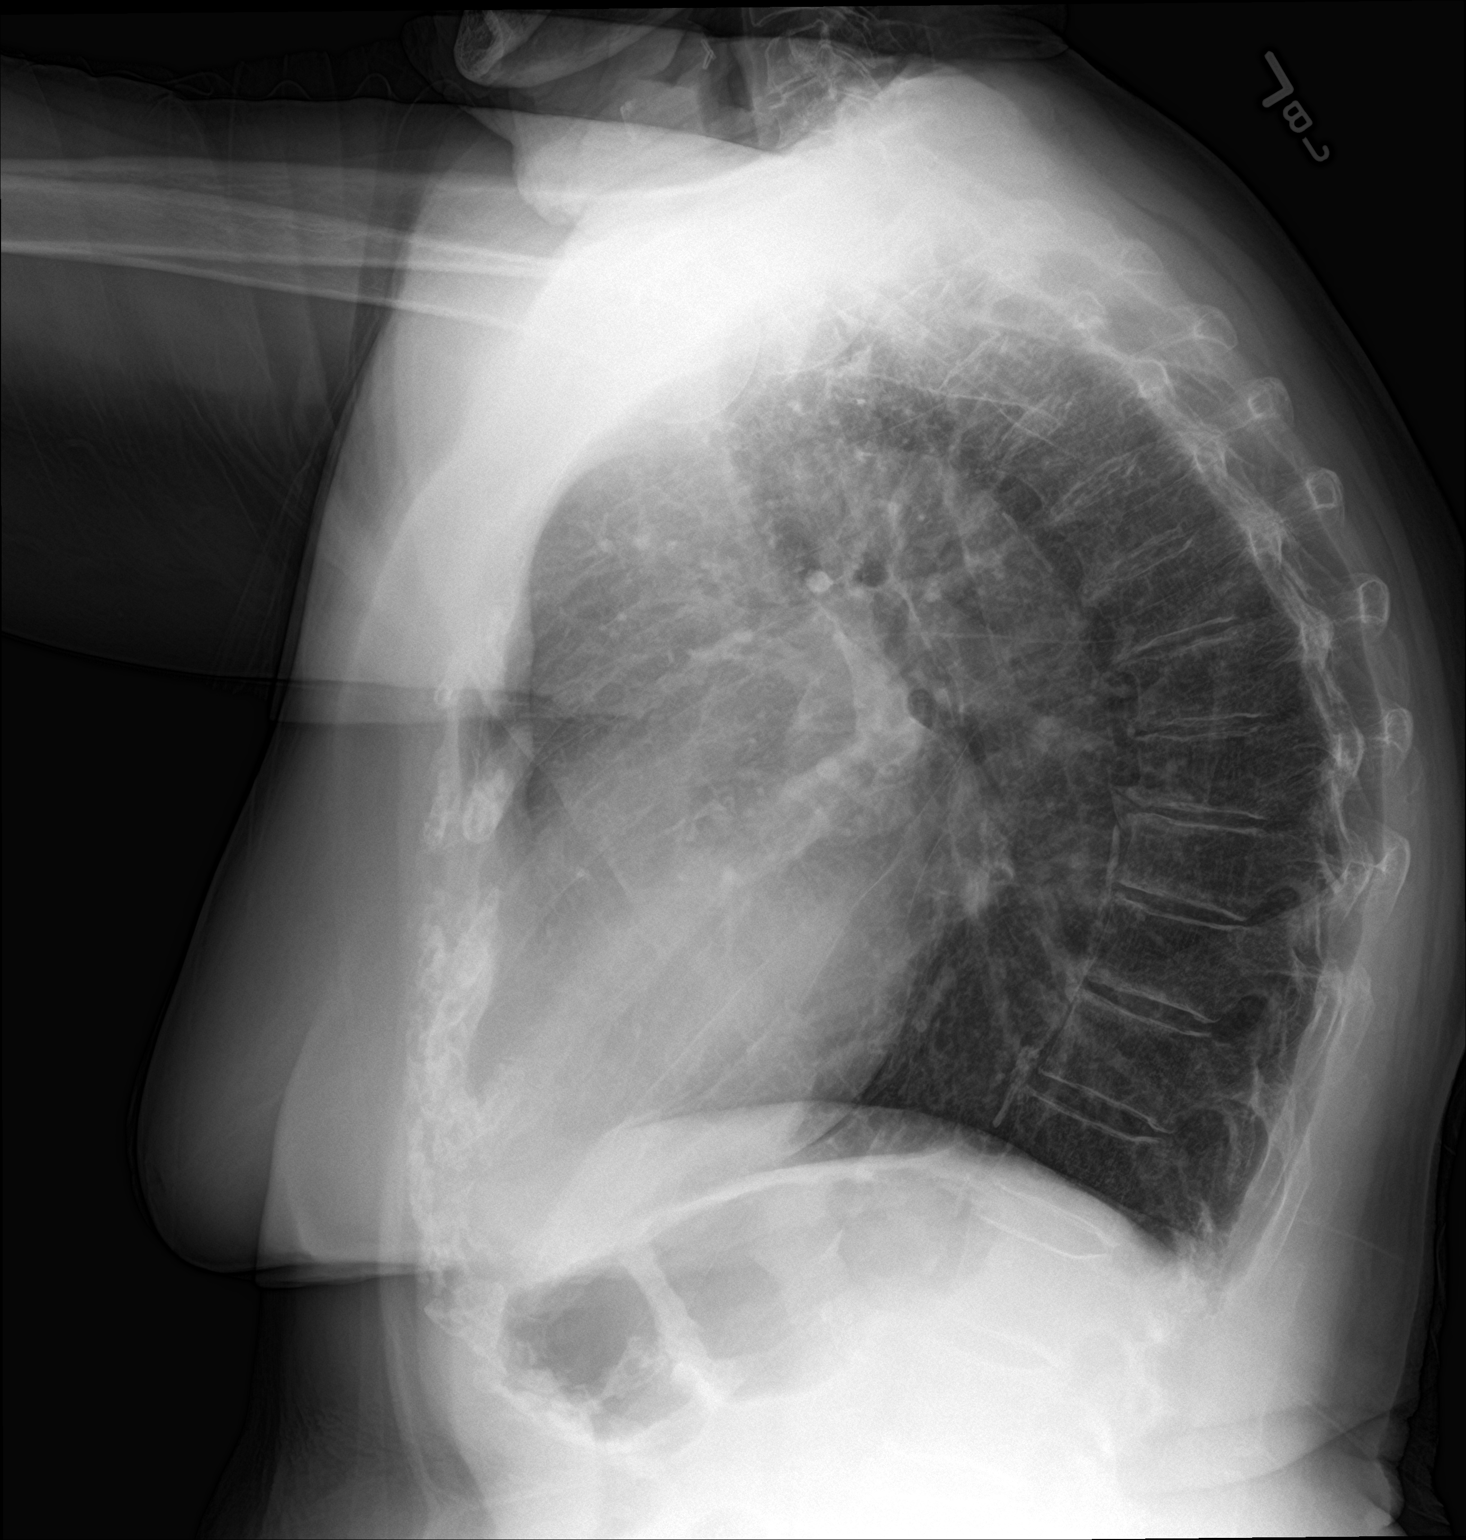

[2 of 2 positions shown; findings below may reference images not displayed]

FINDINGS: Heart and mediastinal contours are within normal limits. No focal
opacities or effusions. No acute bony abnormality. Aortic
atherosclerosis.
IMPRESSION: No active cardiopulmonary disease.

## 2022-09-17 ENCOUNTER — Ambulatory Visit: Payer: Medicare Other | Attending: Cardiovascular Disease | Admitting: *Deleted

## 2022-09-17 DIAGNOSIS — I482 Chronic atrial fibrillation, unspecified: Secondary | ICD-10-CM | POA: Diagnosis not present

## 2022-09-17 DIAGNOSIS — Z5181 Encounter for therapeutic drug level monitoring: Secondary | ICD-10-CM

## 2022-09-17 LAB — POCT INR: INR: 2.6 (ref 2.0–3.0)

## 2022-09-17 NOTE — Patient Instructions (Signed)
Continue warfarin 1/2 tablet daily.  Recheck INR in 6 weeks.

## 2022-10-23 ENCOUNTER — Other Ambulatory Visit: Payer: Self-pay | Admitting: Cardiology

## 2022-10-23 DIAGNOSIS — I482 Chronic atrial fibrillation, unspecified: Secondary | ICD-10-CM

## 2022-10-23 NOTE — Telephone Encounter (Signed)
Warfarin refill Afib Last INR 09/17/22 Last OV 04/20/22

## 2022-10-27 ENCOUNTER — Ambulatory Visit: Payer: Medicare Other | Attending: Cardiovascular Disease | Admitting: *Deleted

## 2022-10-27 DIAGNOSIS — I482 Chronic atrial fibrillation, unspecified: Secondary | ICD-10-CM | POA: Diagnosis not present

## 2022-10-27 DIAGNOSIS — Z5181 Encounter for therapeutic drug level monitoring: Secondary | ICD-10-CM

## 2022-10-27 LAB — POCT INR: INR: 2 (ref 2.0–3.0)

## 2022-10-27 NOTE — Patient Instructions (Signed)
Continue warfarin 1/2 tablet daily.  Recheck INR in 6 weeks.  

## 2022-12-10 ENCOUNTER — Ambulatory Visit: Payer: Medicare Other | Attending: Cardiovascular Disease | Admitting: *Deleted

## 2022-12-10 DIAGNOSIS — Z5181 Encounter for therapeutic drug level monitoring: Secondary | ICD-10-CM | POA: Diagnosis not present

## 2022-12-10 DIAGNOSIS — I482 Chronic atrial fibrillation, unspecified: Secondary | ICD-10-CM | POA: Diagnosis not present

## 2022-12-10 LAB — POCT INR: POC INR: 3.1

## 2022-12-10 NOTE — Patient Instructions (Signed)
Description   Continue warfarin 1/2 tablet daily. Eat an extra serving of greens. Recheck INR in 6 weeks.

## 2023-01-07 DIAGNOSIS — Z961 Presence of intraocular lens: Secondary | ICD-10-CM | POA: Diagnosis not present

## 2023-01-07 DIAGNOSIS — H52203 Unspecified astigmatism, bilateral: Secondary | ICD-10-CM | POA: Diagnosis not present

## 2023-01-07 DIAGNOSIS — H353131 Nonexudative age-related macular degeneration, bilateral, early dry stage: Secondary | ICD-10-CM | POA: Diagnosis not present

## 2023-01-07 DIAGNOSIS — H524 Presbyopia: Secondary | ICD-10-CM | POA: Diagnosis not present

## 2023-01-07 DIAGNOSIS — H5213 Myopia, bilateral: Secondary | ICD-10-CM | POA: Diagnosis not present

## 2023-01-10 NOTE — Progress Notes (Unsigned)
Cardiology Clinic Note   Patient [name] Date of Encounter: 01/13/2023  Primary Care Provider:  Carylon Perches, MD Primary Cardiologist:  Nanetta Batty, MD  Patient Profile    89 year old female  with history of renal artery stenosis status post right renal artery stenting by Dr. Allyson Sabal on 07/06/2004, with follow-up renal artery Doppler studies on 08/16/2020 which revealed widely patent right renal artery stent, hypertension, hyperlipidemia intolerant to statin therapy, atrial fibrillation on warfarin therapy, CHADS VASC Score of  4,Coumadin followed in [place] office; carotid artery disease with high-grade left ICA stenosis status post left carotid endarterectomy 10/06/2019, with follow-up carotid Doppler on 3 09/09/2020 which revealed widely patent endarterectomy site.   Past Medical History    Past Medical History:  Diagnosis Date   Arthritis    Atrial fibrillation (HCC)    Cervical disc disease    Dyspnea    on exertion   History of kidney stones    Hyperlipidemia    Hypertension    Nosebleed    RBBB (right bundle branch block)    Renal artery stenosis (HCC)    Stenosis of carotid artery    Past Surgical History:  Procedure Laterality Date   BACK SURGERY     lumbar interbody fusion   Cardiolite  12/205   negative   CAROTID ENDARTERECTOMY Left 10/06/2019   CAROTID STENT  07-06-04   CATARACT EXTRACTION     DOPPLER ECHOCARDIOGRAPHY     normal 2D. borderline concentric LVH   ENDARTERECTOMY Left 10/06/2019   Procedure: LEFT ENDARTERECTOMY CAROTID;  Surgeon: Nada Libman, MD;  Location: [facility] OR;  Service: Vascular;  Laterality: Left;   kidney stent     PATCH ANGIOPLASTY Left 10/06/2019   Procedure: Patch Angioplasty using a Xenosure Biologic Patch;  Surgeon: Nada Libman, MD;  Location: [facility] OR;  Service: Vascular;  Laterality: Left;   renal doppler  08/04/2006   patent right renal artery stent   TONSILLECTOMY      Allergies  Allergies  Allergen Reactions   Aspirin  Other (See Comments)    Nose bleeds   Beta Adrenergic Blockers Nausea And Vomiting   Penicillins Other (See Comments)    Skins comes off tongue  Did it involve swelling of the face/tongue/throat, SOB, or low BP? No Did it involve sudden or severe rash/hives, skin peeling, or any reaction on the inside of your mouth or nose? No Did you need to seek medical attention at a hospital or doctor's office? Yes When did it last happen?     43-23 years old  If all above answers are "NO", may proceed with cephalosporin use.    Statins Other (See Comments)    Sever Myalgia and Leg cramping     History of Present Illness    [name] comes today for ongoing assessment management of atrial fibrillation, hypercholesterolemia, and hypertension.  She states she has been feeling much more tired lately.  She has been feeling her heart rate slightly elevated.  She tries to remain active even though she does have musculoskeletal issues that tend to slow her down.  She did go zip lining in October 2023 and is showing some pictures.  She continues to go to ED to have Coumadin adjusted with PT/INRs onsite.  Home Medications    Current Outpatient Medications  Medication Sig Dispense Refill   acetaminophen (TYLENOL) 500 MG tablet Take 500-1,000 mg by mouth every 6 (six) hours as needed (for pain.).  Azelaic Acid 15 % gel Apply topically 2 (two) times daily as needed.     diazepam (VALIUM) 5 MG tablet Take 5 mg by mouth at bedtime as needed for sedation.      doxazosin (CARDURA) 2 MG tablet Take 2 mg by mouth daily after lunch.      hydrochlorothiazide (HYDRODIURIL) 25 MG tablet Take 25 mg by mouth daily.      warfarin (COUMADIN) 5 MG tablet TAKE 1/2 TABLET BY MOUTH ONCE DAILY AS DIRECTED. 70 tablet 1   propranolol ER (INDERAL LA) 80 MG 24 hr capsule Take 2 tablets in the AM and 2 tablet in the PM 120 capsule 1   No current facility-administered medications for this visit.     Family History    Family  History  Problem Relation Age of Onset   Hypertension Mother    Hypertension Sister    Diabetes Sister    She indicated that her mother is deceased. She indicated that her father is deceased. She indicated that the status of her sister is unknown.  Social History    Social History   Socioeconomic History   Marital status: Married    Spouse name: Not on file   Number of children: Not on file   Years of education: Not on file   Highest education level: Not on file  Occupational History   Not on file  Tobacco Use   Smoking status: Former    Types: Cigarettes    Quit date: 06/14/1985    Years since quitting: 37.6   Smokeless tobacco: Never   Tobacco comments:    Former smoker 03/19/22  Vaping Use   Vaping Use: Never used  Substance and Sexual Activity   Alcohol use: No   Drug use: No   Sexual activity: Not on file  Other Topics Concern   Not on file  Social History Narrative   Not on file   Social Determinants of Health   Financial Resource Strain: Not on file  Food Insecurity: Not on file  Transportation Needs: Not on file  Physical Activity: Not on file  Stress: Not on file  Social Connections: Not on file  Intimate Partner Violence: Not on file     Review of Systems    General:  No chills, fever, night sweats or weight changes.  Cardiovascular:  No chest pain, dyspnea on exertion, edema, orthopnea, palpitations, paroxysmal nocturnal dyspnea. Dermatological: No rash, lesions/masses Respiratory: No cough, dyspnea Urologic: No hematuria, dysuria Abdominal:   No nausea, vomiting, diarrhea, bright red blood per rectum, melena, or hematemesis Neurologic:  No visual changes, wkns, changes in mental status. All other systems reviewed and are otherwise negative except as noted above.       Physical Exam    VS:  BP (!) 160/90 (BP Location: Right Arm, Patient Position: Sitting, Cuff Size: Normal)   Pulse 88   Ht 5\' 3"  (1.6 m)   Wt 170 lb 9.6 oz (77.4 kg)   SpO2  94%   BMI 30.22 kg/m  , BMI Body mass index is 30.22 kg/m.     GEN: Well nourished, well developed, in no acute distress. HEENT: normal. Neck: Supple, no JVD, carotid bruits, or masses. Cardiac: IRRR, tachycardic, no murmurs, rubs, or gallops. No clubbing, cyanosis, edema.  Radials/DP/PT 2+ and equal bilaterally.  Respiratory:  Respirations regular and unlabored, clear to auscultation bilaterally. GI: Soft, nontender, nondistended, BS + x 4. MS: no deformity or atrophy. Skin: warm and dry, no rash.  Neuro:  Strength and sensation are intact. Psych: Normal affect.      Lab Results  Component Value Date   WBC 7.3 11/17/2021   HGB 12.7 11/17/2021   HCT 39.2 11/17/2021   MCV 93.8 11/17/2021   PLT 231 11/17/2021   Lab Results  Component Value Date   CREATININE 0.93 11/17/2021   BUN 13 11/17/2021   NA 139 11/17/2021   K 3.3 (L) 11/17/2021   CL 102 11/17/2021   CO2 30 11/17/2021   Lab Results  Component Value Date   ALT 12 06/12/2020   AST 14 06/12/2020   ALKPHOS 93 06/12/2020   BILITOT 0.7 06/12/2020   Lab Results  Component Value Date   CHOL 179 06/12/2020   HDL 68 06/12/2020   LDLCALC 90 06/12/2020   TRIG 118 06/12/2020   CHOLHDL 2.6 06/12/2020    No results found for: "HGBA1C"   Review of Prior Studies  Carotid Artery Doppler Study 10/08/2021 Summary:  Right Carotid: Velocities in the right ICA are consistent with a 1-39%  stenosis.  Left Carotid: Velocities in the left ICA are consistent with a 1-39%  stenosis.  Vertebrals:  Bilateral vertebral arteries demonstrate antegrade flow.  Subclavians: Normal flow hemodynamics were seen in bilateral subclavian               arteries.  Renal Artery Doppler Study:10/08/2021 Right: No evidence of right renal artery stenosis. RRV flow present.         Normal size right kidney. Normal right Resisitive Index.         Abnormal cortical thickness of right kidney.  Left:  No evidence of left renal artery stenosis. LRV  flow present.         Normal size of left kidney. Normal left Resistive Index.         Abnormal cortical thickness of the left kidney.  Mesenteric:  Normal Celiac artery and Superior Mesenteric artery findings.   NM Stress Test 09/11/2021 Baseline EKG showed atrial fibrillation with RBBB and nonspecific T wave abnormality with no change during Lexiscan infusion. The left ventricular ejection fraction is normal (55-65%). Nuclear stress EF: 61%. There was no ST segment deviation noted during stress. The study is normal. This is a low risk study.    Echocardiogram 08/12/2018 1. The left ventricle has normal systolic function of 55-60%. The cavity  size is normal. There is no left ventricular wall thickness. Echo evidence  of indeterminate diastolic filling patterns. Elevated left ventricular  end-diastolic pressure. The left  ventricular diastology could not be evaluatedsecondary to atrial  fibrillation.   2. Severely dilated left atrial size.   3. Moderately dilated right atrial size.   4. Normal tricuspid valve.   5. Tricuspid regurgitation is mild.   6. The aortic valve tricuspid. There is mild thickening and sclerosis  without any evidence of stenosis of the aortic valve.   7. The aortic root is normal is size and structure.   8. The inferior vena cava was dilated in size with >50% respiratory  variablity.   9. No atrial level shunt detected by color flow Doppler.       Assessment & Plan   1.  Atrial fibrillation: Heart rate is not well-controlled, she is feeling much more tired : lately.  I will go up on the propranolol to 80 mg in the morning and 80 mg in the evening.  She is to keep up with her heart rate.  If she begins to feel  more tired or dizzy she is to stop the extra dose.  Continue Coumadin therapy.  Can lengthen her PT/INR checks to every 8 weeks from every 6 weeks.  2.  Hypertension: I rechecked her blood pressure today in the office and it remains elevated at 160/90.   She remains on doxazosin 2 mg daily, hydrochlorothiazide 25 mg daily.  May need to add additional medication if blood pressures not well-controlled.  Will see how she does with increased dose of propranolol.  3.  Hypercholesterolemia: Patient is intolerant of statins.  She had been on fish oil in the past I suggested she restart this.  Also offered Zetia and she would like to hold off on adding any additional medications at this time.  Low-cholesterol diet is recommended.         Signed, Bettey Mare. Liborio Nixon, ANP, AACC   01/13/2023 10:27 AM      Office [phone] Fax [phone]  Notice: This dictation was prepared with Dragon dictation along with smaller phrase technology. Any transcriptional errors that result from this process are unintentional and may not be corrected upon review.

## 2023-01-13 ENCOUNTER — Encounter: Payer: Self-pay | Admitting: Adult Health

## 2023-01-13 ENCOUNTER — Ambulatory Visit: Payer: Medicare Other | Attending: Adult Health | Admitting: Adult Health

## 2023-01-13 VITALS — BP 160/90 | HR 88 | Ht 63.0 in | Wt 170.6 lb

## 2023-01-13 DIAGNOSIS — E7800 Pure hypercholesterolemia, unspecified: Secondary | ICD-10-CM

## 2023-01-13 DIAGNOSIS — I482 Chronic atrial fibrillation, unspecified: Secondary | ICD-10-CM

## 2023-01-13 DIAGNOSIS — I10 Essential (primary) hypertension: Secondary | ICD-10-CM

## 2023-01-13 MED ORDER — PROPRANOLOL HCL ER 80 MG PO CP24: ORAL_CAPSULE | ORAL | 1 refills | Status: DC

## 2023-01-13 NOTE — Patient Instructions (Signed)
Medication Instructions:  Increase Propranolol 80 mg ( Take 2 Tablets In The Morning and Two Tablets In The Evening). *If you need a refill on your cardiac medications before your next appointment, please call your pharmacy*   Lab Work: No Labs  If you have labs (blood work) drawn today and your tests are completely normal, you will receive your results only by: MyChart Message (if you have MyChart) OR A paper copy in the mail If you have any lab test that is abnormal or we need to change your treatment, we will call you to review the results.   Testing/Procedures: No Testing   Follow-Up: At [facility], you and your health needs are our priority.  As part of our continuing mission to provide you with exceptional heart care, we have created designated Provider Care Teams.  These Care Teams include your primary Cardiologist (physician) and Advanced Practice Providers (APPs -  Physician Assistants and Nurse Practitioners) who all work together to provide you with the care you need, when you need it.  We recommend signing up for the patient portal called "MyChart".  Sign up information is provided on this After Visit Summary.  MyChart is used to connect with patients for Virtual Visits (Telemedicine).  Patients are able to view lab/test results, encounter notes, upcoming appointments, etc.  Non-urgent messages can be sent to your provider as well.   To learn more about what you can do with MyChart, go to [email or web address].    Your next appointment:   1 month(s)  Provider:   Joni Reining, DNP, ANP

## 2023-01-21 ENCOUNTER — Ambulatory Visit: Payer: Medicare Other | Attending: Cardiovascular Disease | Admitting: *Deleted

## 2023-01-21 DIAGNOSIS — I482 Chronic atrial fibrillation, unspecified: Secondary | ICD-10-CM | POA: Diagnosis not present

## 2023-01-21 DIAGNOSIS — Z5181 Encounter for therapeutic drug level monitoring: Secondary | ICD-10-CM | POA: Diagnosis not present

## 2023-01-21 LAB — POCT INR
INR: 2.6 (ref 2.0–3.0)
INR: 2.6 (ref 2.0–3.0)

## 2023-01-21 NOTE — Patient Instructions (Signed)
Continue warfarin 1/2 tablet daily. Continue greens Recheck INR in 8 weeks.

## 2023-01-22 DIAGNOSIS — M79642 Pain in left hand: Secondary | ICD-10-CM | POA: Diagnosis not present

## 2023-01-22 DIAGNOSIS — M65332 Trigger finger, left middle finger: Secondary | ICD-10-CM | POA: Diagnosis not present

## 2023-02-11 NOTE — Progress Notes (Signed)
Cardiology Clinic Note   Patient [name] Date of Encounter: 02/11/2023  Primary Care Provider:  Carylon Perches, MD Primary Cardiologist:  Savannah Batty, MD  Patient Profile    89 year old female  with history of renal artery stenosis status post right renal artery stenting by Dr. Allyson Sabal on 07/06/2004, with follow-up renal artery Doppler studies on 08/16/2020 which revealed widely patent right renal artery stent, hypertension, hyperlipidemia intolerant to statin therapy, atrial fibrillation on warfarin therapy, CHADS VASC Score of  4, on Coumadin followed in [place] office; carotid artery disease with high-grade left ICA stenosis status post left carotid endarterectomy 10/06/2019, with follow-up carotid Doppler on 3 09/09/2020 which revealed widely patent endarterectomy site.   Past Medical History    Past Medical History:  Diagnosis Date   Arthritis    Atrial fibrillation (HCC)    Cervical disc disease    Dyspnea    on exertion   History of kidney stones    Hyperlipidemia    Hypertension    Nosebleed    RBBB (right bundle branch block)    Renal artery stenosis (HCC)    Stenosis of carotid artery    Past Surgical History:  Procedure Laterality Date   BACK SURGERY     lumbar interbody fusion   Cardiolite  12/205   negative   CAROTID ENDARTERECTOMY Left 10/06/2019   CAROTID STENT  07-06-04   CATARACT EXTRACTION     DOPPLER ECHOCARDIOGRAPHY     normal 2D. borderline concentric LVH   ENDARTERECTOMY Left 10/06/2019   Procedure: LEFT ENDARTERECTOMY CAROTID;  Surgeon: Nada Libman, MD;  Location: [facility] OR;  Service: Vascular;  Laterality: Left;   kidney stent     PATCH ANGIOPLASTY Left 10/06/2019   Procedure: Patch Angioplasty using a Xenosure Biologic Patch;  Surgeon: Nada Libman, MD;  Location: [facility] OR;  Service: Vascular;  Laterality: Left;   renal doppler  08/04/2006   patent right renal artery stent   TONSILLECTOMY      Allergies  Allergies  Allergen Reactions    Aspirin Other (See Comments)    Nose bleeds   Beta Adrenergic Blockers Nausea And Vomiting   Penicillins Other (See Comments)    Skins comes off tongue  Did it involve swelling of the face/tongue/throat, SOB, or low BP? No Did it involve sudden or severe rash/hives, skin peeling, or any reaction on the inside of your mouth or nose? No Did you need to seek medical attention at a hospital or doctor's office? Yes When did it last happen?     50-29 years old  If all above answers are "NO", may proceed with cephalosporin use.    Statins Other (See Comments)    Sever Myalgia and Leg cramping     History of Present Illness    Savannah Kirk comes today for follow up after adjustment of medication  She has hx of atrial fib, and has been followed by the Afib  continues on coumadin, hypertension, and hyperlipidemia.  She is here for follow-up after medication adjustment as her heart rate has been elevated.  On last office visit I increased her propranolol to 80 mg in the morning and 80 mg in the evening.  She is feeling much better with the increased dose.  She is feeling less tired heart rate is much better controlled.  She is having trouble getting the new dose and is requiring a paper prescription.  Otherwise she continues to follow with the Coumadin clinic and  with her primary care provider.  Home Medications    Current Outpatient Medications  Medication Sig Dispense Refill   acetaminophen (TYLENOL) 500 MG tablet Take 500-1,000 mg by mouth every 6 (six) hours as needed (for pain.).     Azelaic Acid 15 % gel Apply topically 2 (two) times daily as needed.     diazepam (VALIUM) 5 MG tablet Take 5 mg by mouth at bedtime as needed for sedation.      doxazosin (CARDURA) 2 MG tablet Take 2 mg by mouth daily after lunch.      hydrochlorothiazide (HYDRODIURIL) 25 MG tablet Take 25 mg by mouth daily.      propranolol ER (INDERAL LA) 80 MG 24 hr capsule Take 2 tablets in the AM and 2 tablet in the PM 120  capsule 1   warfarin (COUMADIN) 5 MG tablet TAKE 1/2 TABLET BY MOUTH ONCE DAILY AS DIRECTED. 70 tablet 1   No current facility-administered medications for this visit.     Family History    Family History  Problem Relation Age of Onset   Hypertension Mother    Hypertension Sister    Diabetes Sister    She indicated that her mother is deceased. She indicated that her father is deceased. She indicated that the status of her sister is unknown.  Social History    Social History   Socioeconomic History   Marital status: Married    Spouse name: Not on file   Number of children: Not on file   Years of education: Not on file   Highest education level: Not on file  Occupational History   Not on file  Tobacco Use   Smoking status: Former    Current packs/day: 0.00    Types: Cigarettes    Quit date: 06/14/1985    Years since quitting: 37.6   Smokeless tobacco: Never   Tobacco comments:    Former smoker 03/19/22  Vaping Use   Vaping status: Never Used  Substance and Sexual Activity   Alcohol use: No   Drug use: No   Sexual activity: Not on file  Other Topics Concern   Not on file  Social History Narrative   Not on file   Social Determinants of Health   Financial Resource Strain: Not on file  Food Insecurity: Not on file  Transportation Needs: Not on file  Physical Activity: Not on file  Stress: Not on file  Social Connections: Not on file  Intimate Partner Violence: Not on file     Review of Systems    General:  No chills, fever, night sweats or weight changes.  Cardiovascular:  No chest pain, dyspnea on exertion, edema, orthopnea, palpitations, paroxysmal nocturnal dyspnea. Dermatological: No rash, lesions/masses Respiratory: No cough, dyspnea Urologic: No hematuria, dysuria Abdominal:   No nausea, vomiting, diarrhea, bright red blood per rectum, melena, or hematemesis Neurologic:  No visual changes, wkns, changes in mental status. All other systems reviewed  and are otherwise negative except as noted above.       Physical Exam    VS:  There were no vitals taken for this visit. , BMI There is no height or weight on file to calculate BMI.     GEN: Well nourished, well developed, in no acute distress. HEENT: normal. Neck: Supple, no JVD, carotid bruits, or masses. Cardiac: IRRR, slightly tachycardic today, but at rest 62 bpm.  No murmurs, rubs, or gallops. No clubbing, cyanosis, edema.  Radials/DP/PT 2+ and equal bilaterally.  Respiratory:  Respirations regular and unlabored, clear to auscultation bilaterally. GI: Soft, nontender, nondistended, BS + x 4. MS: no deformity or atrophy. Skin: warm and dry, no rash.  No evidence of bleeding or hematoma or excessive bruising. Neuro:  Strength and sensation are intact. Psych: Normal affect.      Lab Results  Component Value Date   WBC 7.3 11/17/2021   HGB 12.7 11/17/2021   HCT 39.2 11/17/2021   MCV 93.8 11/17/2021   PLT 231 11/17/2021   Lab Results  Component Value Date   CREATININE 0.93 11/17/2021   BUN 13 11/17/2021   NA 139 11/17/2021   K 3.3 (L) 11/17/2021   CL 102 11/17/2021   CO2 30 11/17/2021   Lab Results  Component Value Date   ALT 12 06/12/2020   AST 14 06/12/2020   ALKPHOS 93 06/12/2020   BILITOT 0.7 06/12/2020   Lab Results  Component Value Date   CHOL 179 06/12/2020   HDL 68 06/12/2020   LDLCALC 90 06/12/2020   TRIG 118 06/12/2020   CHOLHDL 2.6 06/12/2020    No results found for: "HGBA1C"   Review of Prior Studies Carotid Artery Doppler Study 10/08/2021 Summary:  Right Carotid: Velocities in the right ICA are consistent with a 1-39%  stenosis.  Left Carotid: Velocities in the left ICA are consistent with a 1-39%  stenosis.  Vertebrals:  Bilateral vertebral arteries demonstrate antegrade flow.  Subclavians: Normal flow hemodynamics were seen in bilateral subclavian               arteries.  Renal Artery Doppler Study:10/08/2021 Right: No evidence of right  renal artery stenosis. RRV flow present.         Normal size right kidney. Normal right Resisitive Index.         Abnormal cortical thickness of right kidney.  Left:  No evidence of left renal artery stenosis. LRV flow present.         Normal size of left kidney. Normal left Resistive Index.         Abnormal cortical thickness of the left kidney.  Mesenteric:  Normal Celiac artery and Superior Mesenteric artery findings.   NM Stress Test 09/11/2021 Baseline EKG showed atrial fibrillation with RBBB and nonspecific T wave abnormality with no change during Lexiscan infusion. The left ventricular ejection fraction is normal (55-65%). Nuclear stress EF: 61%. There was no ST segment deviation noted during stress. The study is normal. This is a low risk study.    Echocardiogram 08/12/2018 1. The left ventricle has normal systolic function of 55-60%. The cavity  size is normal. There is no left ventricular wall thickness. Echo evidence  of indeterminate diastolic filling patterns. Elevated left ventricular  end-diastolic pressure. The left  ventricular diastology could not be evaluatedsecondary to atrial  fibrillation.   2. Severely dilated left atrial size.   3. Moderately dilated right atrial size.   4. Normal tricuspid valve.   5. Tricuspid regurgitation is mild.   6. The aortic valve tricuspid. There is mild thickening and sclerosis  without any evidence of stenosis of the aortic valve.   7. The aortic root is normal is size and structure.   8. The inferior vena cava was dilated in size with >50% respiratory  variablity.   9. No atrial level shunt detected by color flow Doppler.            Assessment & Plan   1.  Atrial fibrillation: On review of her blood pressures and heart  rate, heart rate is better controlled on propranolol 80 mg twice daily.  Blood pressure has remained stable between 129/65 to 117/69.  She states she is feeling better and is less tired.  She will continue on  Coumadin therapy, PT/INR is checked through Coumadin clinic in the evening and she has a follow-up appointment in September 2024.  She is to see [place] again in 6 months.  If she begins to feel her heart racing, she feels dizzy, or significantly more fatigued she is to call us sooner.  2.  Hypertension: Blood pressure is well-controlled currently.  Continue current medication regimen without any further changes or just months.  She remains on doxazosin 2 mg daily and hydrochlorothiazide 25 mg daily.  3.  Hypercholesterolemia: She is intolerant of statins.  She is recommended to be on either Zetia or fish oil.  She would like to hold off on any additional medications at this time.  She is due to follow-up with primary care, Dr. Ouida Sills, follow-up lipids and LFTs are recommended.  If she remains not well-controlled would highly recommend lipid management medication.        Signed, Bettey Mare. Liborio Nixon, ANP, AACC   02/11/2023 7:27 AM      Office [phone] Fax [phone]  Notice: This dictation was prepared with Dragon dictation along with smaller phrase technology. Any transcriptional errors that result from this process are unintentional and may not be corrected upon review.

## 2023-02-12 ENCOUNTER — Encounter: Payer: Self-pay | Admitting: Adult Health

## 2023-02-12 ENCOUNTER — Ambulatory Visit: Payer: Medicare Other | Attending: Adult Health | Admitting: Adult Health

## 2023-02-12 VITALS — BP 136/80 | HR 96 | Ht 63.0 in | Wt 170.8 lb

## 2023-02-12 DIAGNOSIS — I10 Essential (primary) hypertension: Secondary | ICD-10-CM

## 2023-02-12 DIAGNOSIS — I4811 Longstanding persistent atrial fibrillation: Secondary | ICD-10-CM | POA: Diagnosis not present

## 2023-02-12 DIAGNOSIS — E7800 Pure hypercholesterolemia, unspecified: Secondary | ICD-10-CM

## 2023-02-12 MED ORDER — PROPRANOLOL HCL ER 80 MG PO CP24: ORAL_CAPSULE | ORAL | 2 refills | Status: DC

## 2023-02-12 NOTE — Patient Instructions (Signed)
Medication Instructions:  No Chanes *If you need a refill on your cardiac medications before your next appointment, please call your pharmacy*   Lab Work: No Labs If you have labs (blood work) drawn today and your tests are completely normal, you will receive your results only by: MyChart Message (if you have MyChart) OR A paper copy in the mail If you have any lab test that is abnormal or we need to change your treatment, we will call you to review the results.   Testing/Procedures: No Testing   Follow-Up: At [facility], you and your health needs are our priority.  As part of our continuing mission to provide you with exceptional heart care, we have created designated Provider Care Teams.  These Care Teams include your primary Cardiologist (physician) and Advanced Practice Providers (APPs -  Physician Assistants and Nurse Practitioners) who all work together to provide you with the care you need, when you need it.  We recommend signing up for the patient portal called "MyChart".  Sign up information is provided on this After Visit Summary.  MyChart is used to connect with patients for Virtual Visits (Telemedicine).  Patients are able to view lab/test results, encounter notes, upcoming appointments, etc.  Non-urgent messages can be sent to your provider as well.   To learn more about what you can do with MyChart, go to [email or web address].    Your next appointment:   6 month(s)  Provider:   Nanetta Batty, MD

## 2023-02-22 DIAGNOSIS — S00412A Abrasion of left ear, initial encounter: Secondary | ICD-10-CM | POA: Diagnosis not present

## 2023-03-02 DIAGNOSIS — S01312A Laceration without foreign body of left ear, initial encounter: Secondary | ICD-10-CM | POA: Diagnosis not present

## 2023-03-02 DIAGNOSIS — I10 Essential (primary) hypertension: Secondary | ICD-10-CM | POA: Diagnosis not present

## 2023-03-02 DIAGNOSIS — I480 Paroxysmal atrial fibrillation: Secondary | ICD-10-CM | POA: Diagnosis not present

## 2023-03-11 ENCOUNTER — Telehealth: Payer: Self-pay | Admitting: *Deleted

## 2023-03-11 NOTE — Telephone Encounter (Addendum)
Called and spoke to pt who stated that she has decided to STOP taking warfarin and would like to cancel appt for 03/18/2023.   Pt stated her ear was bleeding and went to urgent care over a week ago and was given a 5 days course of ear drops (pt has finished course of ear drops). Ear no longer bleeding. Pt followed up with PCP concerning her ear. Pt stated that the physician never instructed her to stop her warfarin.   Pt stated that she stopped taking her warfarin about 1 week ago and does not want to restart. Informed pt that the benefits of being on warfarin outway the risk and informed her that it is against medical advice to stop her warfarin.   Informed pt that not being on warfarin can put her at risk for developing a blood clot or stroke.   Pt stated that she still does not want to take warfarin. Pt stated she is not interested in taking another blood thinner at this time. Pt stated that she tried Eliquis in the past and it has hard on her kidneys.   Canceled pt's appt for 03/18/2023.

## 2023-03-11 NOTE — Telephone Encounter (Signed)
Patient wants a call back from RN Misty Stanley regarding canceling her Coumadin visit on 9/5.

## 2023-04-16 DIAGNOSIS — Z23 Encounter for immunization: Secondary | ICD-10-CM | POA: Diagnosis not present

## 2023-05-04 DIAGNOSIS — H9201 Otalgia, right ear: Secondary | ICD-10-CM | POA: Diagnosis not present

## 2023-05-18 ENCOUNTER — Ambulatory Visit: Payer: Self-pay | Admitting: *Deleted

## 2023-07-08 ENCOUNTER — Other Ambulatory Visit (INDEPENDENT_AMBULATORY_CARE_PROVIDER_SITE_OTHER): Payer: Medicare Other

## 2023-07-08 ENCOUNTER — Ambulatory Visit: Payer: Medicare Other | Admitting: Orthopaedic Surgery

## 2023-07-08 VITALS — Ht 62.0 in | Wt 169.0 lb

## 2023-07-08 DIAGNOSIS — M25552 Pain in left hip: Secondary | ICD-10-CM

## 2023-07-08 DIAGNOSIS — M545 Low back pain, unspecified: Secondary | ICD-10-CM

## 2023-07-08 NOTE — Progress Notes (Signed)
Office Visit Note   Patient: Savannah Kirk           Date of Birth: 04/21/1934           MRN: [id number] Visit Date: 07/08/2023              Requested by: Carylon Perches, MD [address] [place],  [place] [address] PCP: Carylon Perches, MD   Assessment & Plan: Visit Diagnoses:  1. Acute left-sided low back pain, unspecified whether sciatica present   2. Pain in left hip     Plan: Patient's intermittent symptoms may be related to some foraminal narrowing or progression at the L2-3 or L3-4.  Her symptoms are not severe enough to consider surgical intervention and she has sometimes when she has minimal symptoms.  She still has inability to walk to the grocery store without leaning on a cart.  She can do these a treadmill or go frequently to stores and ambulate with the cart.  She states several years before her lumbar fusion she could walk 3 miles a day.  We reviewed her preoperative MRI scan which showed severe stenosis at the L4-5 level and mild changes at T3-4.  If she develops progressive claudication symptoms or increased radicular symptoms she can return.  Follow-Up Instructions: No follow-ups on file.   Orders:  Orders Placed This Encounter  Procedures   XR Lumbar Spine Complete   XR HIP UNILAT W OR W/O PELVIS 2-3 VIEWS LEFT   No orders of the defined types were placed in this encounter.     Procedures: No procedures performed   Clinical Data: No additional findings.   Subjective: Chief Complaint  Patient presents with   Lower Back - Pain   Left Hip - Pain    HPI 89 year old female was climbing up the attic steps when she felt some pain.  Increased pain when she was coming down laterally over the hip and buttocks and some groin pain.  Previous surgery by Dr. Trey Sailors with single level fusion L4-5 with expandable cage.  Since her lateral buttocks pain she has used Tylenol with relief.  She has a sister with rheumatoid arthritis is on prednisone and was wanting a  prednisone would make her feel better.  Patient was improved after back fusion with Dr. Channing Mutters.  She at times has some discomfort that radiates into her hips.  Previous MRI showed some mild changes at L3-4 and L2-3.  Anterolisthesis at L4-5 was a level she had fused.  Review of Systems positive for hypertension carotid stenosis negative for MI.  Positive for atrial FaBB she stopped her anticoagulation on her own after problems with bleeding in the ear.   Objective: Vital Signs: Ht 5\' 2"  (1.575 m)   Wt 169 lb (76.7 kg)   BMI 30.91 kg/m   Physical Exam Constitutional:      Appearance: She is well-developed.  HENT:     Head: Normocephalic.     Right Ear: External ear normal.     Left Ear: External ear normal. There is no impacted cerumen.  Eyes:     Pupils: Pupils are equal, round, and reactive to light.  Neck:     Thyroid: No thyromegaly.     Trachea: No tracheal deviation.  Cardiovascular:     Rate and Rhythm: Normal rate.  Pulmonary:     Effort: Pulmonary effort is normal.  Abdominal:     Palpations: Abdomen is soft.  Musculoskeletal:     Cervical back:  No rigidity.  Skin:    General: Skin is warm and dry.  Neurological:     Mental Status: She is alert and oriented to person, place, and time.  Psychiatric:        Behavior: Behavior normal.     Ortho Exam mild tenderness over the trochanter mild over the sciatic notch negative straight leg raising 90 degrees negative logroll hip.  Specialty Comments:  No specialty comments available.  Imaging: XR Lumbar Spine Complete Result Date: 07/08/2023 AP lateral lumbar spine images are obtained to review some calcification of the abdominal aorta.  Instrumented fusion with expandable cage L4-5 without loosening or subsidence.  3 mm anterolisthesis and cage is even with the posterior aspect of the L5 vertebral body. Impression: Previous fusion L4-5.  No significant adjacent level disease noted on plain radiographs.  XR HIP UNILAT W  OR W/O PELVIS 2-3 VIEWS LEFT Result Date: 07/08/2023 AP pelvis AP left hip frog-leg left hip obtained and reviewed this shows normal hip joint.  Instrumented L4-5 fusion noted with expandable cage. Impression: Pelvis and hip x-rays negative for acute changes.    PMFS History: Patient Active Problem List   Diagnosis Date Noted   Secondary hypercoagulable state (HCC) 03/19/2022   Atypical chest pain 07/18/2021   Carotid stenosis 10/06/2019   Carotid artery disease (HCC) 08/30/2019   Dyspnea on exertion 08/31/2018   Hyperlipidemia 11/02/2014   Renal artery stenosis (HCC) 06/14/2013   Essential hypertension 06/14/2013   Past Medical History:  Diagnosis Date   Arthritis    Atrial fibrillation (HCC)    Cervical disc disease    Dyspnea    on exertion   History of kidney stones    Hyperlipidemia    Hypertension    Nosebleed    RBBB (right bundle branch block)    Renal artery stenosis (HCC)    Stenosis of carotid artery     Family History  Problem Relation Age of Onset   Hypertension Mother    Hypertension Sister    Diabetes Sister     Past Surgical History:  Procedure Laterality Date   BACK SURGERY     lumbar interbody fusion   Cardiolite  12/205   negative   CAROTID ENDARTERECTOMY Left 10/06/2019   CAROTID STENT  07-06-04   CATARACT EXTRACTION     DOPPLER ECHOCARDIOGRAPHY     normal 2D. borderline concentric LVH   ENDARTERECTOMY Left 10/06/2019   Procedure: LEFT ENDARTERECTOMY CAROTID;  Surgeon: Nada Libman, MD;  Location: [facility] OR;  Service: Vascular;  Laterality: Left;   kidney stent     PATCH ANGIOPLASTY Left 10/06/2019   Procedure: Patch Angioplasty using a Xenosure Biologic Patch;  Surgeon: Nada Libman, MD;  Location: [facility] OR;  Service: Vascular;  Laterality: Left;   renal doppler  08/04/2006   patent right renal artery stent   TONSILLECTOMY     Social History   Occupational History   Not on file  Tobacco Use   Smoking status: Former    Current  packs/day: 0.00    Types: Cigarettes    Quit date: 06/14/1985    Years since quitting: 38.0   Smokeless tobacco: Never   Tobacco comments:    Former smoker 03/19/22  Vaping Use   Vaping status: Never Used  Substance and Sexual Activity   Alcohol use: No   Drug use: No   Sexual activity: Not on file

## 2023-07-20 ENCOUNTER — Telehealth: Payer: Self-pay | Admitting: Radiology

## 2023-07-20 NOTE — Telephone Encounter (Signed)
 Please see message from [place] office below and advise.  She is having pain again and wants to know if Dr. Ophelia Charter can send her in something.  She doesn't want pain med, requesting anti-inflam med.  She uses [facility] [place] and her number is [phone]

## 2023-07-20 NOTE — Telephone Encounter (Signed)
I called, no answer. Will try again

## 2023-07-22 NOTE — Telephone Encounter (Signed)
 I called patient. She is no longer taking any blood thinner. She states that she discontinued coumadin  last August. She was walking in [place] and her leg locked up and she could not go another step until she exercised it out. She would like to know if you would please reconsider sending in the antiinflammatory since she does not take a blood thinner?  Please advise.

## 2023-07-23 NOTE — Telephone Encounter (Signed)
 Left voicemail advising.

## 2023-08-14 ENCOUNTER — Other Ambulatory Visit: Payer: Self-pay | Admitting: Cardiology

## 2023-08-14 DIAGNOSIS — I482 Chronic atrial fibrillation, unspecified: Secondary | ICD-10-CM

## 2023-08-30 DIAGNOSIS — E785 Hyperlipidemia, unspecified: Secondary | ICD-10-CM | POA: Diagnosis not present

## 2023-08-30 DIAGNOSIS — I4821 Permanent atrial fibrillation: Secondary | ICD-10-CM | POA: Diagnosis not present

## 2023-08-30 DIAGNOSIS — I2510 Atherosclerotic heart disease of native coronary artery without angina pectoris: Secondary | ICD-10-CM | POA: Diagnosis not present

## 2023-08-30 DIAGNOSIS — I10 Essential (primary) hypertension: Secondary | ICD-10-CM | POA: Diagnosis not present

## 2023-08-30 DIAGNOSIS — Q271 Congenital renal artery stenosis: Secondary | ICD-10-CM | POA: Diagnosis not present

## 2023-09-07 DIAGNOSIS — N183 Chronic kidney disease, stage 3 unspecified: Secondary | ICD-10-CM | POA: Diagnosis not present

## 2023-09-07 DIAGNOSIS — Q271 Congenital renal artery stenosis: Secondary | ICD-10-CM | POA: Diagnosis not present

## 2023-09-07 DIAGNOSIS — I4821 Permanent atrial fibrillation: Secondary | ICD-10-CM | POA: Diagnosis not present

## 2023-09-07 DIAGNOSIS — I10 Essential (primary) hypertension: Secondary | ICD-10-CM | POA: Diagnosis not present

## 2023-09-07 DIAGNOSIS — Z789 Other specified health status: Secondary | ICD-10-CM | POA: Diagnosis not present

## 2023-09-07 DIAGNOSIS — E785 Hyperlipidemia, unspecified: Secondary | ICD-10-CM | POA: Diagnosis not present

## 2023-09-07 DIAGNOSIS — M4800 Spinal stenosis, site unspecified: Secondary | ICD-10-CM | POA: Diagnosis not present

## 2023-09-07 DIAGNOSIS — Z0001 Encounter for general adult medical examination with abnormal findings: Secondary | ICD-10-CM | POA: Diagnosis not present

## 2023-10-11 ENCOUNTER — Telehealth: Payer: Self-pay | Admitting: Radiology

## 2023-10-11 MED ORDER — PREDNISONE 10 MG (21) PO TBPK: ORAL_TABLET | ORAL | 0 refills | Status: DC

## 2023-10-11 NOTE — Telephone Encounter (Signed)
 Please see message from [place] office below and advise.  She is having severe back pain.  Dr.Fagan told her not to take Alleve anymore.  So she isn't and it was working.  But now is she is really hurting.  She doesn't want any narcotics but something for the pain.  She uses [facility].  Her home # is [phone] and her cell # is [phone].

## 2023-10-11 NOTE — Telephone Encounter (Signed)
 Sent to pharmacy. I called patient and advised.

## 2023-10-11 NOTE — Addendum Note (Signed)
 Addended by: Rogers Seeds on: 10/11/2023 11:44 AM   Modules accepted: Orders

## 2023-10-13 DIAGNOSIS — M65321 Trigger finger, right index finger: Secondary | ICD-10-CM | POA: Diagnosis not present

## 2023-10-13 DIAGNOSIS — M65332 Trigger finger, left middle finger: Secondary | ICD-10-CM | POA: Diagnosis not present

## 2023-11-15 ENCOUNTER — Telehealth: Payer: Self-pay | Admitting: Orthopedic Surgery

## 2023-11-15 ENCOUNTER — Other Ambulatory Visit: Payer: Self-pay | Admitting: Orthopedic Surgery

## 2023-11-15 DIAGNOSIS — M65331 Trigger finger, right middle finger: Secondary | ICD-10-CM | POA: Diagnosis not present

## 2023-11-15 DIAGNOSIS — M65332 Trigger finger, left middle finger: Secondary | ICD-10-CM | POA: Diagnosis not present

## 2023-11-15 DIAGNOSIS — M810 Age-related osteoporosis without current pathological fracture: Secondary | ICD-10-CM

## 2023-11-15 DIAGNOSIS — M19031 Primary osteoarthritis, right wrist: Secondary | ICD-10-CM | POA: Diagnosis not present

## 2023-11-15 DIAGNOSIS — M65321 Trigger finger, right index finger: Secondary | ICD-10-CM | POA: Diagnosis not present

## 2023-11-15 NOTE — Telephone Encounter (Signed)
 Pt called stating her last appt with Dr Murrel Arnt wanted her to see Dr. Sulema Endo. Pt states she has severe pains in her back and almost unable to walk. Pt is asking if she can see Dr Sulema Endo and don't mind coming to [facility]. Sulema Endo has no openings until June 5 and pt states she can't wait that long. Please advice and call pt at [phone].

## 2023-11-15 NOTE — Telephone Encounter (Signed)
 I added to the schedule. I also spoke with patient, advised, and gave addresses to both our office and [facility].

## 2023-11-18 ENCOUNTER — Ambulatory Visit (HOSPITAL_BASED_OUTPATIENT_CLINIC_OR_DEPARTMENT_OTHER)
Admission: RE | Admit: 2023-11-18 | Discharge: 2023-11-18 | Disposition: A | Discharge status: Home / Self Care | Source: Ambulatory Visit | Attending: Orthopedic Surgery | Admitting: Orthopedic Surgery

## 2023-11-18 DIAGNOSIS — M810 Age-related osteoporosis without current pathological fracture: Secondary | ICD-10-CM | POA: Insufficient documentation

## 2023-11-18 DIAGNOSIS — Z780 Asymptomatic menopausal state: Secondary | ICD-10-CM | POA: Diagnosis not present

## 2023-11-25 ENCOUNTER — Ambulatory Visit: Admitting: Orthopedic Surgery

## 2023-11-25 ENCOUNTER — Other Ambulatory Visit (INDEPENDENT_AMBULATORY_CARE_PROVIDER_SITE_OTHER)

## 2023-11-25 VITALS — BP 156/104 | HR 92 | Ht 62.0 in | Wt 170.0 lb

## 2023-11-25 DIAGNOSIS — M810 Age-related osteoporosis without current pathological fracture: Secondary | ICD-10-CM

## 2023-11-25 DIAGNOSIS — M545 Low back pain, unspecified: Secondary | ICD-10-CM

## 2023-11-25 DIAGNOSIS — M48062 Spinal stenosis, lumbar region with neurogenic claudication: Secondary | ICD-10-CM

## 2023-11-25 NOTE — Progress Notes (Signed)
 Orthopedic Spine Surgery Office Note  Assessment: Patient is a 89 y.o. female with low back pain that radiates into her bilateral posterior thighs.  Resolved with flexion of the lumbar spine.  Suspect neurogenic claudication   Plan: -Explained that initially conservative treatment is tried as a significant number of patients may experience relief with these treatment modalities. Discussed that the conservative treatments include:  -activity modification  -physical therapy  -over the counter pain medications  -medrol dosepak  -lumbar steroid injections -Patient has tried Tylenol , Aleve, prednisone , tizanidine - Patient has done over 6 weeks of conservative treatment with Dr. Hildy Lowers, so recommended an MRI of the lumbar spine to evaluate for spinal stenosis -Has had intolerances with multiple medications and does not want any narcotics.  She has tried many of the medications that I typically recommend, so for now, told her to continue with Tylenol  1000 mg 3 times daily -For her osteoporosis, referred her to our osteoporosis clinic -Patient should return to office in 4 weeks, x-rays at next visit: None   Patient expressed understanding of the plan and all questions were answered to the patient's satisfaction.   ___________________________________________________________________________   History:  Patient is a 89 y.o. female who presents today for lumbar spine.  Patient has had about 6 months of back pain radiating into her bilateral posterior thighs.  She notes the pain when she is walking or standing.  She said it gets better if she sits down.  She also notes with better if she leans over something when walking.  She starts to feel the pain within a few minutes of walking or standing.  There is no trauma or injury that preceded the onset of her pain.  She said she had an L4/5 lumbar fusion in 2016.  Has done well up until the last 6 months or so.   Weakness: Denies Symptoms of imbalance:  Denies Paresthesias and numbness: Yes, has numbness and paresthesias in her feet from neuropathy.  No recent changes Bowel or bladder incontinence: Yes, has functional incontinence with both bowel and bladder.  She describes having difficulty getting to the bathroom and time at night.  However, she retains the sense of knowing she has to go and can control it but sometimes cannot make it in time Saddle anesthesia: Denies  Treatments tried: Tylenol , Aleve, prednisone , tizanidine  Review of systems: Denies fevers and chills, night sweats, unexplained weight loss, history of cancer.  Has had pain that wakes her at night  Past medical history: HTN Atrial fibrillation Neuropathy HLD  Allergies: aspirin, beta blockers, penicillin, statins  Past surgical history:  L4/5 decompression and fusion Carotid endarterectomy Cataract surgery Tonsillectomy  Social history: Denies use of nicotine product (smoking, vaping, patches, smokeless) Alcohol use: denies Denies recreational drug use   Physical Exam:  BMI of 31.1  General: no acute distress, appears stated age Neurologic: alert, answering questions appropriately, following commands Respiratory: unlabored breathing on room air, symmetric chest rise Psychiatric: appropriate affect, normal cadence to speech   MSK (spine):  -Strength exam      Left  Right EHL    5/5  5/5 TA    5/5  5/5 GSC    5/5  5/5 Knee extension  5/5  5/5 Hip flexion   5/5  5/5  -Sensory exam    Sensation intact to light touch in L3-S1 nerve distributions of bilateral lower extremities  -Achilles DTR: 1/4 on the left, 1/4 on the right -Patellar tendon DTR: 1/4 on the left, 1/4  on the right  -Straight leg raise: negative bilaterally -Clonus: no beats bilaterally  -Left hip exam: no pain through range of motion -Right hip exam: no pain through range of motion  Imaging: XRs of the lumbar spine from 11/24/2023 were independently reviewed and  interpreted, showing disc height loss at L5/S1.  There is an interbody device in the former L4/5 disc space that appears in appropriate position.  Posterior instrumentation seen at L4/5 with no lucency around the screws.  No fracture or dislocation seen.   Patient [name] Patient MRN: [id number] Date of visit: 11/25/23

## 2023-11-26 ENCOUNTER — Ambulatory Visit
Admission: RE | Admit: 2023-11-26 | Discharge: 2023-11-26 | Disposition: A | Discharge status: Home / Self Care | Source: Ambulatory Visit | Attending: Orthopedic Surgery | Admitting: Orthopedic Surgery

## 2023-11-26 DIAGNOSIS — M48062 Spinal stenosis, lumbar region with neurogenic claudication: Secondary | ICD-10-CM | POA: Diagnosis not present

## 2023-11-26 DIAGNOSIS — M5126 Other intervertebral disc displacement, lumbar region: Secondary | ICD-10-CM | POA: Diagnosis not present

## 2023-11-26 DIAGNOSIS — Z981 Arthrodesis status: Secondary | ICD-10-CM | POA: Diagnosis not present

## 2023-11-29 DIAGNOSIS — M1811 Unilateral primary osteoarthritis of first carpometacarpal joint, right hand: Secondary | ICD-10-CM | POA: Diagnosis not present

## 2023-11-29 DIAGNOSIS — G5601 Carpal tunnel syndrome, right upper limb: Secondary | ICD-10-CM | POA: Diagnosis not present

## 2023-11-29 NOTE — Progress Notes (Signed)
 Orthopaedic Surgery Hand and Upper Extremity History and Physical Examination  CC: Right hand pain   HPI 11/29/2023: Savannah Kirk is a 89 y.o. female presents today with worsening right hand pain / numbness / tingling into the thumb, index finger, and middle finger. She was seen on 11/15/23 where she was given a trigger finger injection into the right middle finger, which provided no relief. She has not noticed any triggering of her finger since the injections, but does report worsening pain / tingling. Her symptoms awake her at night while she is trying to sleep. She has a carpal tunnel wrist brace, but has not been using it at night.   She also reports significant pain at the base of the right thumb. She has a comfort cool brace that she will occasionally use.  She has been using Tylenol  with minimal relief.   Prior injections: 10/13/23: Right index trigger finger; left middle trigger finger  11/15/23: Right middle trigger finger   Problem List:  Patient Active Problem List  Diagnosis  . Pseudophakia  . Numbness  . Pain  . Primary osteoarthritis of first carpometacarpal joint of right hand  . Carpal tunnel syndrome of right wrist    Past Medical History: Past Medical History:  Diagnosis Date  . A-fib    (CMD)   . Arthritis   . Hypertension   . Macular degeneration   . Renal artery stenosis      Medications: Meds Ordered in [place]  Medication Sig Dispense Refill  . acetaminophen  (TYLENOL ) 500 mg tablet Take 500-1,000 mg by mouth.    SABRA amLODIPine (NORVASC) 2.5 mg tablet     . diazePAM  (VALIUM ) 5 mg tablet Take 5 mg by mouth daily as needed.    . doxazosin  (CARDURA ) 2 mg tablet Take 1 mg by mouth.    . hydroCHLOROthiazide  (HYDRODIURIL ) 25 mg tablet Take 25 mg by mouth daily.    . predniSONE  (STERAPRED DS) 10 mg DsPk dose pack     . propranoloL  (INDERAL  LA) 80 mg 24 hr capsule Take 2 capsules by mouth in the morning and 2 capsules in the evening.     No current  Epic-ordered facility-administered medications on file.    Allergies: Allergies as of 11/29/2023 - Reviewed 11/29/2023  Allergen Reaction Noted  . Aspirin Other (See Comments) 11/05/2015  . Beta-blockers (beta-adrenergic blocking agts) Nausea And Vomiting 01/13/2023  . Penicillin Other (See Comments) 11/05/2015  . Statins-hmg-coa reductase inhibitors Other (See Comments) 01/13/2023  . Tizanidine  11/15/2023    Past Surgical History: Past Surgical History:  Procedure Laterality Date  . CATARACT EXTRACTION Bilateral 2001, 1997   Procedure: CATARACT EXTRACTION  . RENAL ARTERY STENT     Procedure: RENAL ARTERY STENT  . SPINAL FUSION     Procedure: SPINAL FUSION     Social History: Social History   Occupational History  . Not on file  Tobacco Use  . Smoking status: Former  . Smokeless tobacco: Never  Substance and Sexual Activity  . Alcohol use: No  . Drug use: Never  . Sexual activity: Not on file     Family History: Family History  Problem Relation Name Age of Onset  . Cataracts Mother    . Stroke Mother    . Hypertension Mother    . Arthritis Mother    . Diabetes Sister    . Glaucoma Neg Hx    . Macular degeneration Neg Hx     Otherwise, no relevant orthopaedic family history  ROS: Review of Systems: All systems reviewed and are negative except that mentioned in HPI  Work/Sport/Hobbies: See HPI  Physical Examination: Vitals:   11/29/23 1340  BP: 137/87  Pulse: 93   Constitutional: Awake, alert.  WN/WD Appearance: healthy, no acute distress, well-groomed Affect: Normal HEENT: EOMI, mucous membranes moist CV: RRR Pulm: breathing comfortably  Right Upper Extremity / Hand Overlying skin is warm dry and intact.  There is no signs of rash irritation or infection.  There is no obvious deformity.  No obvious nail deformities.  Capillary refill is brisk and skin turgor is appropriate.  Neurovascularly intact. Fingers are warm and pink.  NTTP over the A1  pulley of the right middle finger. No obvious triggering noted in any digits. Positive Phalen's test. Negative Tinel's and Durken's test. Increased pain with stress retropulsion of the thumb metacarpal, positive grind test.   Sensation: intact to light touch in median, radial and ulnar nerve distributions  Motor: intact in AIN, PIN, and ulnar nerve distributions Vascular: Fingers warm and well perfused, palpable radial pulse  Assessment/Plan: 1. Right carpal tunnel syndrome: She describes neurogenic pain in the median distribution and has positive finding on her exam c/w CTS. Discussed the nature of carpal tunnel syndrome.  We discussed treatment options including acceptance, day and/or nighttime splinting, injection of the carpal tunnel, and carpal tunnel release.   - She already has a right wrist brace, which I have advised her to use at night  - Ordered an EMG/NCV  2. Right thumb CMC arthritis: chronic, worsening.  - She already has a comfort cool brace, which I recommend using when active  - Recommend topical NSAIDs or topical CBD cream  - Consider wright hand x-rays and CMC injection if pain persists   Follow up on the same day as her EMG/NCV to discuss results.   Alli Cosentino, PA-C [facility] [employer] [facility] Department of Orthopaedic Surgery [facility] 11/29/2023 3:05 PM

## 2023-12-02 DIAGNOSIS — G5601 Carpal tunnel syndrome, right upper limb: Secondary | ICD-10-CM | POA: Diagnosis not present

## 2023-12-03 DIAGNOSIS — G5601 Carpal tunnel syndrome, right upper limb: Secondary | ICD-10-CM | POA: Diagnosis not present

## 2023-12-07 ENCOUNTER — Other Ambulatory Visit: Payer: Self-pay | Admitting: Adult Health

## 2023-12-07 ENCOUNTER — Other Ambulatory Visit: Payer: Self-pay

## 2023-12-07 MED ORDER — PROPRANOLOL HCL ER 80 MG PO CP24: 160.0000 mg | ORAL_CAPSULE | Freq: Two times a day (BID) | ORAL | 0 refills | Status: DC

## 2023-12-07 MED ORDER — PROPRANOLOL HCL ER 80 MG PO CP24: 160.0000 mg | ORAL_CAPSULE | Freq: Two times a day (BID) | ORAL | 0 refills | Status: AC

## 2023-12-27 ENCOUNTER — Ambulatory Visit: Admitting: Orthopedic Surgery

## 2023-12-27 DIAGNOSIS — M48062 Spinal stenosis, lumbar region with neurogenic claudication: Secondary | ICD-10-CM | POA: Diagnosis not present

## 2023-12-27 NOTE — Progress Notes (Signed)
 Orthopedic Spine Surgery Office Note   Assessment: Patient is a 89 y.o. female with low back pain that radiates into her bilateral posterior thighs.  Resolved with flexion of the lumbar spine consistent with neurogenic claudication from her lumbar stenosis at L2/3 and L3/4     Plan: -Patient has tried Tylenol , Aleve, prednisone , tizanidine -Recommended a diagnostic/therapeutic lumbar ESI as an additional non-operative treatment to try -Explained that if the injection did not give her satisfactory relief, her remaining treatment options would be surgical decompression or pain management -Patient should return to office in 6 weeks, x-rays at next visit: None     Patient expressed understanding of the plan and all questions were answered to the patient's satisfaction.    ___________________________________________________________________________     History:   Patient is a 89 y.o. female who presents today for follow up on her lumbar spine.  Patient continues to have low back pain that radiates into her bilateral posterior thighs.  She only has the pain when she is standing or walking.  It immediately improves if she sits down or leans forward.  She has not noticed any new symptoms since she was last seen in the office. She rates the pain as a 8/10 at its worst. Has had the pain for over 6 months now.     Treatments tried: Tylenol , Aleve, prednisone , tizanidine    Physical Exam:   General: no acute distress, appears stated age Neurologic: alert, answering questions appropriately, following commands Respiratory: unlabored breathing on room air, symmetric chest rise Psychiatric: appropriate affect, normal cadence to speech     MSK (spine):   -Strength exam                                                   Left                  Right EHL                              5/5                  5/5 TA                                 5/5                  5/5 GSC                              5/5                  5/5 Knee extension            5/5                  5/5 Hip flexion                    5/5                  5/5   -Sensory exam  Sensation intact to light touch in L3-S1 nerve distributions of bilateral lower extremities   Imaging: XRs of the lumbar spine from 11/24/2023 were independently reviewed and interpreted, showing disc height loss at L5/S1.  There is an interbody device in the former L4/5 disc space that appears in appropriate position.  Posterior instrumentation seen at L4/5 with no lucency around the screws.  No fracture or dislocation seen.  MRI of the lumbar spine from 11/26/2023 is independently reviewed and interpreted, showing central, lateral recess, and bilateral foraminal stenosis at L2/3.  Central stenosis at L3/4.  Posterior instrumentation at L4 and L5.  Interbody device in the L4/5 disc space.  No evidence of instrumentation complication (although MRI not the best modality to evaluate.      Patient [name] Patient MRN: [id number] Date of visit: 12/27/23

## 2023-12-29 ENCOUNTER — Encounter: Payer: Self-pay | Admitting: Physician Assistant

## 2023-12-29 ENCOUNTER — Ambulatory Visit (INDEPENDENT_AMBULATORY_CARE_PROVIDER_SITE_OTHER): Admitting: Physician Assistant

## 2023-12-29 DIAGNOSIS — M810 Age-related osteoporosis without current pathological fracture: Secondary | ICD-10-CM | POA: Diagnosis not present

## 2023-12-29 NOTE — Progress Notes (Signed)
 Office Visit Note   Patient: Savannah Kirk           Date of Birth: August 07, 1933           MRN: [id number] Visit Date: 12/29/2023              Requested by: Diedra Fowler, MD [address] [place],  [place] [address] PCP: Artemisa Bile, MD   Assessment & Plan: Visit Diagnoses:  1. Age-related osteoporosis without current pathological fracture     Plan: Patient is an 89 year old woman who is referred by Dr. Sulema Endo for evaluation of osteoporosis.  She does not have any history of using osteoporosis medications no history of fracture she is does have atrial fibrillation.  No history of cancer no history of kidney disease or ulcer or GI issues.  No history of reflux.  She did go through menopause when she was 46 and did not do hormone replacement therapy she does not take calcium  as she cannot tolerate it and has had kidney stones she does not take vitamin D she is a former smoker.  She does not exercise because she has been having ongoing difficulties with her back she does not consume alcoholic beverages no problem with her dental and sees a dentist no family history of fractures in her family.  She is not very interested in osteoporosis management at this time.  She does not want to add another medication would went over thoroughly her chance and risk of fracture as she does have a bone density score T-score of -2.3.  We talked about lifestyle issues.  She tries to eat a vegetarian diet.  Does not really look at her calcium  intake.  I did give her an information about Prolia should she change her mind she would also need to draw vitamin D and I would have to obtain her at labs that she had recently drawn by her outside physician may follow-up as needed.  She was given information explanations 45 minutes was spent reviewing her chart spending time with her and giving her options  Follow-Up Instructions: Return if symptoms worsen or fail to improve.   Orders:  No orders of the defined types were placed  in this encounter.  No orders of the defined types were placed in this encounter.     Procedures: No procedures performed   Clinical Data: No additional findings.   Subjective: No chief complaint on file.   HPI pleasant 89 year old woman presents today referral from Dr. Sulema Endo for evaluation of osteoporosis with a bone density T-score of -2.3  Review of Systems  All other systems reviewed and are negative.    Objective: Vital Signs: There were no vitals taken for this visit.  Physical Exam Constitutional:      Appearance: Normal appearance.   Skin:    General: Skin is warm and dry.   Neurological:     General: No focal deficit present.     Mental Status: She is alert and oriented to person, place, and time.      Specialty Comments:  CLINICAL DATA:  Low back pain and neurogenic claudication. History of prior lumbar surgery.   EXAM: MRI LUMBAR SPINE WITHOUT CONTRAST   TECHNIQUE: Multiplanar, multisequence MR imaging of the lumbar spine was performed. No intravenous contrast was administered.   COMPARISON:  MRI lumbar spine 02/13/2015.   FINDINGS: Segmentation:  Standard.   Alignment:  Trace anterolisthesis L4 on L5 is unchanged.   Vertebrae: No fracture, evidence of discitis,  or bone lesion. The patient has undergone L4-5 fusion since the prior MRI.   Conus medullaris and cauda equina: Conus extends to the L1 level. The conus appears normal. There is some buckling of descending nerve roots superior to the L2-3 level.   Paraspinal and other soft tissues: Negative.   Disc levels:   T10-11 and T11-12 are imaged in the sagittal plane only and unremarkable.   T12-L1: Negative.   L1-2: There is a shallow disc bulge without stenosis.   L2-3: Bulky ligamentum flavum thickening and a broad-based disc bulge cause severe central canal stenosis. Moderate bilateral foraminal narrowing is worse on the right. Moderate facet arthropathy noted.   L3-4:  Disc bulge and bulky ligamentum flavum thickening cause moderate to moderately severe central canal stenosis. Mild foraminal narrowing is worse on the right.   L4-5: Status post fusion. There is moderate facet arthropathy. Mild central canal and right lateral recess narrowing is seen. The foramina appear open.   L5-S1: Moderate to advanced facet degenerative change is present. There is a small protrusion in the left foramen causing mild foraminal stenosis. The central canal and right foramen are open.   IMPRESSION: 1. Severe central canal stenosis at L2-3 with moderate bilateral foraminal narrowing worse on the right. 2. Moderate to moderately severe central canal stenosis at L3-4 with mild bilateral foraminal narrowing worse on the right. 3. Status post L4-5 fusion. Mild central canal and right lateral recess narrowing is seen at L4-5. 4. Small protrusion in the left foramen at L5-S1 causes mild foraminal narrowing.     Electronically Signed   By: Etheleen Her M.D.   On: 12/26/2023 08:37  Imaging: No results found.   PMFS History: Patient Active Problem List   Diagnosis Date Noted   Age-related osteoporosis without current pathological fracture 12/29/2023   Secondary hypercoagulable state (HCC) 03/19/2022   Atypical chest pain 07/18/2021   Carotid stenosis 10/06/2019   Carotid artery disease (HCC) 08/30/2019   Dyspnea on exertion 08/31/2018   Hyperlipidemia 11/02/2014   Renal artery stenosis (HCC) 06/14/2013   Essential hypertension 06/14/2013   Past Medical History:  Diagnosis Date   Arthritis    Atrial fibrillation (HCC)    Cervical disc disease    Dyspnea    on exertion   History of kidney stones    Hyperlipidemia    Hypertension    Nosebleed    RBBB (right bundle branch block)    Renal artery stenosis (HCC)    Stenosis of carotid artery     Family History  Problem Relation Age of Onset   Hypertension Mother    Hypertension Sister    Diabetes  Sister     Past Surgical History:  Procedure Laterality Date   BACK SURGERY     lumbar interbody fusion   Cardiolite  12/205   negative   CAROTID ENDARTERECTOMY Left 10/06/2019   CAROTID STENT  07-06-04   CATARACT EXTRACTION     DOPPLER ECHOCARDIOGRAPHY     normal 2D. borderline concentric LVH   ENDARTERECTOMY Left 10/06/2019   Procedure: LEFT ENDARTERECTOMY CAROTID;  Surgeon: Margherita Shell, MD;  Location: [facility] OR;  Service: Vascular;  Laterality: Left;   kidney stent     PATCH ANGIOPLASTY Left 10/06/2019   Procedure: Patch Angioplasty using a Xenosure Biologic Patch;  Surgeon: Margherita Shell, MD;  Location: [facility] OR;  Service: Vascular;  Laterality: Left;   renal doppler  08/04/2006   patent right renal artery stent   TONSILLECTOMY  Social History   Occupational History   Not on file  Tobacco Use   Smoking status: Former    Current packs/day: 0.00    Types: Cigarettes    Quit date: 06/14/1985    Years since quitting: 38.5   Smokeless tobacco: Never   Tobacco comments:    Former smoker 03/19/22  Vaping Use   Vaping status: Never Used  Substance and Sexual Activity   Alcohol use: No   Drug use: No   Sexual activity: Not on file

## 2024-01-05 DIAGNOSIS — R202 Paresthesia of skin: Secondary | ICD-10-CM | POA: Diagnosis not present

## 2024-01-05 DIAGNOSIS — R200 Anesthesia of skin: Secondary | ICD-10-CM | POA: Diagnosis not present

## 2024-01-05 DIAGNOSIS — M19032 Primary osteoarthritis, left wrist: Secondary | ICD-10-CM | POA: Diagnosis not present

## 2024-01-05 NOTE — Progress Notes (Signed)
 Savannah Kirk MRN: [id number] DOB: 04/01/1934 (age: 89 y.o.)  Is in clinic today for follow up.  She has had a right carpal tunnel injection done by Dr. Murrell about a month ago and overall is doing well in terms of that.  She states now the left hand is significant bothersome to her and she describes as feeling like it is on fire.  It mostly involves the thumb through ring finger.  She been wearing her brace at nighttime but states she really cannot tolerate it and sometimes even feels like that makes it worse.  She had a nerve conduction done done by Dr. Trinna but unfortunately this was only of the right hand and not the left hand.  On exam this is a well-nourished well-developed individual in no acute distress.  They are alert and oriented x3.  Mood and affect are age-appropriate.  They are cooperative with the exam.  They ambulate without difficulty or assistive device.  Head is normocephalic atraumatic.  Neck is supple without meningismus.  Cranial nerves II through XII are grossly intact without deficit.  Skin is warm dry and intact without any signs of infection.  Musculoskeletal examination of the left hand: Moderate medial joint line pain.  Mild amount of crepitus.  Sensation is intact and equal compared bilaterally. No hypersensitivity. Quad and hamstring strength are 5/5 when compared bilaterally. No obvious muscle atrophy. No sings of infections. ROM maintained to be WNL.  Negative anterior and posterior drawers.  Negative valgus varus stress testing. There is thenar eminence atrophy.  Positive Phalen's and Durkan's and Tinel's at the left wrist.  Suspected  Impression/plan  1. Numbness and tingling in left hand  NCV/EMG/Ultrasound   Amb DME    2. Localized primary osteoarthritis of carpometacarpal (CMC) joint of left wrist  Amb DME      Carpal tunnel syndrome on the left.  Prednisone  5 mg tablets called in.  I recommended a Dosepak but she states that that is too much for her.  So  she would like to take 5 mg tablets for a week.  I think that this is reasonable.  I also ordered a nerve conduction test to be completed on the left.  She will follow-up with me for those results.  She would like to discuss surgery as a neck step on the left secondary to the degree of the pain.

## 2024-01-10 DIAGNOSIS — M810 Age-related osteoporosis without current pathological fracture: Secondary | ICD-10-CM | POA: Diagnosis not present

## 2024-01-10 DIAGNOSIS — I10 Essential (primary) hypertension: Secondary | ICD-10-CM | POA: Diagnosis not present

## 2024-01-10 DIAGNOSIS — C439 Malignant melanoma of skin, unspecified: Secondary | ICD-10-CM | POA: Diagnosis not present

## 2024-01-10 DIAGNOSIS — G5603 Carpal tunnel syndrome, bilateral upper limbs: Secondary | ICD-10-CM | POA: Diagnosis not present

## 2024-01-11 DIAGNOSIS — Z79899 Other long term (current) drug therapy: Secondary | ICD-10-CM | POA: Diagnosis not present

## 2024-01-11 DIAGNOSIS — K922 Gastrointestinal hemorrhage, unspecified: Secondary | ICD-10-CM | POA: Diagnosis not present

## 2024-01-13 ENCOUNTER — Telehealth (INDEPENDENT_AMBULATORY_CARE_PROVIDER_SITE_OTHER): Payer: Self-pay | Admitting: Gastroenterology

## 2024-01-13 ENCOUNTER — Encounter (INDEPENDENT_AMBULATORY_CARE_PROVIDER_SITE_OTHER): Payer: Self-pay | Admitting: Gastroenterology

## 2024-01-13 ENCOUNTER — Ambulatory Visit (INDEPENDENT_AMBULATORY_CARE_PROVIDER_SITE_OTHER): Admitting: Gastroenterology

## 2024-01-13 VITALS — BP 126/76 | HR 87 | Temp 97.8°F | Ht 63.0 in | Wt 156.4 lb

## 2024-01-13 DIAGNOSIS — R634 Abnormal weight loss: Secondary | ICD-10-CM | POA: Diagnosis not present

## 2024-01-13 DIAGNOSIS — K921 Melena: Secondary | ICD-10-CM

## 2024-01-13 DIAGNOSIS — R1013 Epigastric pain: Secondary | ICD-10-CM | POA: Insufficient documentation

## 2024-01-13 DIAGNOSIS — R1010 Upper abdominal pain, unspecified: Secondary | ICD-10-CM | POA: Diagnosis not present

## 2024-01-13 MED ORDER — SUCRALFATE 1 G PO TABS: 1.0000 g | ORAL_TABLET | Freq: Three times a day (TID) | ORAL | 0 refills | Status: AC

## 2024-01-13 MED ORDER — OMEPRAZOLE 40 MG PO CPDR: 40.0000 mg | DELAYED_RELEASE_CAPSULE | Freq: Every day | ORAL | 2 refills | Status: DC

## 2024-01-13 NOTE — Telephone Encounter (Signed)
 Meredith(Pharmacist at [facility]) left voicemail stating that they received a script for Prilosec 40mg  once daily. Pt just picked up Pantoprazole  40 mg BID from PCP on 01/11/24. Pharmacist was needing to clarify order and if we were needing our script filled. Contacted [facility] and she states that the patient came into pharmacy and [facility] also called the patient. Meredith and patient were able to clarify that they were to fill our script.

## 2024-01-13 NOTE — H&P (View-Only) (Signed)
 Referring Provider: Sheryle Carwin, MD Primary Care Physician:  Savannah Carwin, MD Primary GI Physician: new (Dr. Eartha)   Chief Complaint  Patient presents with   Melena    Pt arrives to discuss melena. Pt saw PCP Monday and told him her stools were dark. Black stools on Monday-normal yesterday and today. Pt has had loss of appetite and some weight loss. Pt was around 170 4 months and today she is 156.4lb. Pt also had lab work done.    HPI:   Savannah Kirk is a 89 y.o. female with past medical history of arthritis, A fib previously on coumadin  (stopped this August 2024) HDL, HTN, RBBB, renal artery stenosis, carotid artery stenosis   Patient presenting today as a new patient for: Melena, weight loss, upper abdominal pain, heme positive   Hgb 11, MCV 93 on 7/1, her hgb was normal at 12.1 in February   States she had routine Visit with PCP, Dr. Sheryle on Monday. She generally has regular BMs each morning but noted on Monday morning her BM was black. She states she was not having diarrhea until she took protonix  2 and then had diarrhea. Her stools were back to normal for the most part today, somewhat looser but no longer dark. She reports that she previously was having looser stools with every meal and since starting a probiotic about 1 year ago she has had 1-2 solid stools per day. She did a stool sample which was positive for blood. She notes that she had no pain but had some bilateral flank pain yesterday and today have some upper abdominal pain. No nausea or vomiting. She reports feeling bloated a little bit of an upset stomach. No BRB in stools. Previously on warfarin but this was stopped in August 2024. She does report she has seen ortho (carpal tunnle) and neurosurgery who previously advised her to take aleve which her PCP advised her to stop and replace with tylenol . She had been taking 1 aleve per day for about 2 weeks, but this was back in September, she did take 1 aleve last Saturday, denies  any other NSAIDs.  She has been on prednisone  for her carpal tunnel but has not had It since June 25th. Her PCP also advised her Monday not to take this anymore. She endorses decreased appetite and she has lost about 13 pounds since December. She denies any early satiety. Has occasional dysphagia with food feeling that it stops in mid chest.   NSAID use: above  Social hx: no etoh or tobacco (previously smoked in the 80s) Fam hx: no CRC or liver disease   Last Colonoscopy:2006Small polyp ablated via cold biopsy from the sigmoid colon.  2.  Small external hemorrhoids.  DIAGNOSIS:  1) polyp (Sigmoid colon, polypectomy):   HYPERPLASTIC POLYP.  Last Endoscopy: never   Past Medical History:  Diagnosis Date   Arthritis    Atrial fibrillation (HCC)    Cervical disc disease    Dyspnea    on exertion   History of kidney stones    Hyperlipidemia    Hypertension    Nosebleed    RBBB (right bundle branch block)    Renal artery stenosis (HCC)    Stenosis of carotid artery     Past Surgical History:  Procedure Laterality Date   BACK SURGERY     lumbar interbody fusion   Cardiolite  12/205   negative   CAROTID ENDARTERECTOMY Left 10/06/2019   CAROTID STENT  07-06-04   CATARACT  EXTRACTION     DOPPLER ECHOCARDIOGRAPHY     normal 2D. borderline concentric LVH   ENDARTERECTOMY Left 10/06/2019   Procedure: LEFT ENDARTERECTOMY CAROTID;  Surgeon: Serene Gaile ORN, MD;  Location: [facility] OR;  Service: Vascular;  Laterality: Left;   kidney stent     PATCH ANGIOPLASTY Left 10/06/2019   Procedure: Patch Angioplasty using a Xenosure Biologic Patch;  Surgeon: Serene Gaile ORN, MD;  Location: [facility] OR;  Service: Vascular;  Laterality: Left;   renal doppler  08/04/2006   patent right renal artery stent   TONSILLECTOMY      Current Outpatient Medications  Medication Sig Dispense Refill   acetaminophen  (TYLENOL ) 500 MG tablet Take 500-1,000 mg by mouth every 6 (six) hours as needed (for pain.).     Azelaic  Acid 15 % gel Apply topically 2 (two) times daily as needed.     cyanocobalamin (VITAMIN B12) 1000 MCG tablet Take 1,000 mcg by mouth daily.     diazepam  (VALIUM ) 5 MG tablet Take 5 mg by mouth at bedtime as needed for sedation.      doxazosin  (CARDURA ) 2 MG tablet Take 2 mg by mouth daily after lunch.      hydrochlorothiazide  (HYDRODIURIL ) 25 MG tablet Take 25 mg by mouth daily.      pantoprazole  (PROTONIX ) 40 MG tablet Take 40 mg by mouth 2 (two) times daily.     predniSONE  (STERAPRED UNI-PAK 21 TAB) 10 MG (21) TBPK tablet Take as directed. 6,5,4,3,2,1 21 tablet 0   Probiotic Product (PROBIOTIC DAILY PO) Take by mouth.     propranolol  ER (INDERAL  LA) 80 MG 24 hr capsule Take 2 capsules (160 mg total) by mouth 2 (two) times daily. 60 capsule 0   tiZANidine (ZANAFLEX) 2 MG tablet Take 2 mg by mouth 2 (two) times daily as needed.     No current facility-administered medications for this visit.    Allergies as of 01/13/2024 - Review Complete 01/13/2024  Allergen Reaction Noted   Aspirin Other (See Comments) 07/28/2012   Beta adrenergic blockers Nausea And Vomiting 01/13/2023   Penicillins Other (See Comments) 08/14/2010   Statins Other (See Comments) 01/13/2023    Social History   Socioeconomic History   Marital status: Married    Spouse name: Not on file   Number of children: Not on file   Years of education: Not on file   Highest education level: Not on file  Occupational History   Not on file  Tobacco Use   Smoking status: Former    Current packs/day: 0.00    Types: Cigarettes    Quit date: 06/14/1985    Years since quitting: 38.6   Smokeless tobacco: Never   Tobacco comments:    Former smoker 03/19/22  Vaping Use   Vaping status: Never Used  Substance and Sexual Activity   Alcohol use: No   Drug use: No   Sexual activity: Not on file  Other Topics Concern   Not on file  Social History Narrative   Not on file   Social Drivers of Health   Financial Resource  Strain: Not on file  Food Insecurity: Low Risk  (01/05/2024)   Received from [facility]   Hunger Vital Sign    Within the past 12 months, you worried that your food would run out before you got money to buy more: Never true    Within the past 12 months, the food you bought just didn't last and you didn't have money to get  more. : Never true  Transportation Needs: No Transportation Needs (01/05/2024)   Received from [employer]    In the past 12 months, has lack of reliable transportation kept you from medical appointments, meetings, work or from getting things needed for daily living? : No  Physical Activity: Not on file  Stress: Not on file  Social Connections: Not on file    Review of systems General: negative for malaise, night sweats, fever, chills, weight loss Neck: Negative for lumps, goiter, pain and significant neck swelling Resp: Negative for cough, wheezing, dyspnea at rest CV: Negative for chest pain, leg swelling, palpitations, orthopnea GI: denies hematochezia, nausea, vomiting, diarrhea, constipation, odyonophagia, early satiety +weight loss +melena +upper abdominal pain +dysphagia  MSK: Negative for joint pain or swelling, back pain, and muscle pain. Derm: Negative for itching or rash Psych: Denies depression, anxiety, memory loss, confusion. No homicidal or suicidal ideation.  Heme: Negative for prolonged bleeding, bruising easily, and swollen nodes. Endocrine: Negative for cold or heat intolerance, polyuria, polydipsia and goiter. Neuro: negative for tremor, gait imbalance, syncope and seizures. The remainder of the review of systems is noncontributory.  Physical Exam: BP 126/76   Pulse 87   Temp 97.8 F (36.6 C)   Ht 5' 3 (1.6 m)   Wt 156 lb 6.4 oz (70.9 kg)   BMI 27.71 kg/m  General:   Alert and oriented. No distress noted. Pleasant and cooperative.  Head:  Normocephalic and atraumatic. Eyes:  Conjuctiva clear without scleral  icterus. Mouth:  Oral mucosa pink and moist. Good dentition. No lesions. Heart: Normal rate and rhythm, s1 and s2 heart sounds present.  Lungs: Clear lung sounds in all lobes. Respirations equal and unlabored. Abdomen:  +BS, soft,  and non-distended. TTP of epigastric region. No rebound or guarding. No HSM or masses noted. Derm: No palmar erythema or jaundice Msk:  Symmetrical without gross deformities. Normal posture. Extremities:  Without edema. Neurologic:  Alert and  oriented x4 Psych:  Alert and cooperative. Normal mood and affect.  Invalid input(s): 6 MONTHS   ASSESSMENT: ELEONORE SHIPPEE is a 89 y.o. female presenting today for melena, upper abdominal pain, weight loss and heme positive stool  Patient with recent intake of aleve, prednisone  for carpal tunnel who had sudden onset of melena Monday with slight drop in hgb to 11 from baseline around 12. She endorses stool color is back to normal but she has upper abdominal pain. Appetite is decreased, some unintentional weight loss over the past few months. Presentation is certainly concerning for PUD, gastritis, duodenitis in setting of NSAIDs/steroids. At this time I am recommending once daily PPI, carafate  1g QID and EGD for further evaluation. As she had diarrhea from protonix , will try omeprazole  instead. We discussed avoiding all NSAIDs and should likely avoid prednisone  as these can be insulting to the GI tract as well. She can take tylenol  as needed and may need to discuss other pain alternatives for now with her orthopedic doctor. Indications, risks and benefits of procedure discussed in detail with patient. Patient verbalized understanding and is in agreement to proceed with EGD.  PLAN:  -start omeprazole  40mg  daily -schedule EGD ASA III  -avoid NSAIDs/prednisone  for now -carafate  1g QID (dissolve tablet in 1/2 oz water, mix and drink slurry)  All questions were answered, patient verbalized understanding and is in agreement with  plan as outlined above.   Follow Up: 2 months   Khyren Hing L. Dredyn Gubbels, MSN, APRN, AGNP-C Adult-Gerontology Nurse Practitioner  [facility]  I have reviewed the note and agree with the APP's assessment as described in this progress note  Toribio Fortune, MD Gastroenterology and Hepatology [facility]

## 2024-01-13 NOTE — Patient Instructions (Addendum)
 We will get you scheduled for upper endoscopy to further evaluate your dark stools and abdominal pain For now, I am sending omeprazole 40mg  to take once daily in the morning before eating and carafate 1g 4 times per day before meals and at bedtime As discussed, please avoid using aleve, ibuprofen, advil, naproxyn, prednisone  as these are very harsh on the GI tract, you CAN take tylenol  if needed. You may need to contact your orthopedic doctor about recommendations for pain control if tylenol  is not working.   Follow up 2 months  It was a pleasure to see you today. I want to create trusting relationships with patients and provide genuine, compassionate, and quality care. I truly value your feedback! please be on the lookout for a survey regarding your visit with me today. I appreciate your input about our visit and your time in completing this!    Maudean Hoffmann L. Joi Leyva, MSN, APRN, AGNP-C Adult-Gerontology Nurse Practitioner [facility] at [facility]

## 2024-01-13 NOTE — Progress Notes (Addendum)
 Referring Provider: Sheryle Carwin, MD Primary Care Physician:  Sheryle Carwin, MD Primary GI Physician: new (Dr. Eartha)   Chief Complaint  Patient presents with   Melena    Pt arrives to discuss melena. Pt saw PCP Monday and told him her stools were dark. Black stools on Monday-normal yesterday and today. Pt has had loss of appetite and some weight loss. Pt was around 170 4 months and today she is 156.4lb. Pt also had lab work done.    HPI:   Savannah Kirk is a 89 y.o. female with past medical history of arthritis, A fib previously on coumadin  (stopped this August 2024) HDL, HTN, RBBB, renal artery stenosis, carotid artery stenosis   Patient presenting today as a new patient for: Melena, weight loss, upper abdominal pain, heme positive   Hgb 11, MCV 93 on 7/1, her hgb was normal at 12.1 in February   States she had routine Visit with PCP, Dr. Sheryle on Monday. She generally has regular BMs each morning but noted on Monday morning her BM was black. She states she was not having diarrhea until she took protonix  2 and then had diarrhea. Her stools were back to normal for the most part today, somewhat looser but no longer dark. She reports that she previously was having looser stools with every meal and since starting a probiotic about 1 year ago she has had 1-2 solid stools per day. She did a stool sample which was positive for blood. She notes that she had no pain but had some bilateral flank pain yesterday and today have some upper abdominal pain. No nausea or vomiting. She reports feeling bloated a little bit of an upset stomach. No BRB in stools. Previously on warfarin but this was stopped in August 2024. She does report she has seen ortho (carpal tunnle) and neurosurgery who previously advised her to take aleve which her PCP advised her to stop and replace with tylenol . She had been taking 1 aleve per day for about 2 weeks, but this was back in September, she did take 1 aleve last Saturday, denies  any other NSAIDs.  She has been on prednisone  for her carpal tunnel but has not had It since June 25th. Her PCP also advised her Monday not to take this anymore. She endorses decreased appetite and she has lost about 13 pounds since December. She denies any early satiety. Has occasional dysphagia with food feeling that it stops in mid chest.   NSAID use: above  Social hx: no etoh or tobacco (previously smoked in the 80s) Fam hx: no CRC or liver disease   Last Colonoscopy:2006Small polyp ablated via cold biopsy from the sigmoid colon.  2.  Small external hemorrhoids.  DIAGNOSIS:  1) polyp (Sigmoid colon, polypectomy):   HYPERPLASTIC POLYP.  Last Endoscopy: never   Past Medical History:  Diagnosis Date   Arthritis    Atrial fibrillation (HCC)    Cervical disc disease    Dyspnea    on exertion   History of kidney stones    Hyperlipidemia    Hypertension    Nosebleed    RBBB (right bundle branch block)    Renal artery stenosis (HCC)    Stenosis of carotid artery     Past Surgical History:  Procedure Laterality Date   BACK SURGERY     lumbar interbody fusion   Cardiolite  12/205   negative   CAROTID ENDARTERECTOMY Left 10/06/2019   CAROTID STENT  07-06-04   CATARACT  EXTRACTION     DOPPLER ECHOCARDIOGRAPHY     normal 2D. borderline concentric LVH   ENDARTERECTOMY Left 10/06/2019   Procedure: LEFT ENDARTERECTOMY CAROTID;  Surgeon: Serene Gaile ORN, MD;  Location: [facility] OR;  Service: Vascular;  Laterality: Left;   kidney stent     PATCH ANGIOPLASTY Left 10/06/2019   Procedure: Patch Angioplasty using a Xenosure Biologic Patch;  Surgeon: Serene Gaile ORN, MD;  Location: [facility] OR;  Service: Vascular;  Laterality: Left;   renal doppler  08/04/2006   patent right renal artery stent   TONSILLECTOMY      Current Outpatient Medications  Medication Sig Dispense Refill   acetaminophen  (TYLENOL ) 500 MG tablet Take 500-1,000 mg by mouth every 6 (six) hours as needed (for pain.).     Azelaic  Acid 15 % gel Apply topically 2 (two) times daily as needed.     cyanocobalamin (VITAMIN B12) 1000 MCG tablet Take 1,000 mcg by mouth daily.     diazepam  (VALIUM ) 5 MG tablet Take 5 mg by mouth at bedtime as needed for sedation.      doxazosin  (CARDURA ) 2 MG tablet Take 2 mg by mouth daily after lunch.      hydrochlorothiazide  (HYDRODIURIL ) 25 MG tablet Take 25 mg by mouth daily.      pantoprazole  (PROTONIX ) 40 MG tablet Take 40 mg by mouth 2 (two) times daily.     predniSONE  (STERAPRED UNI-PAK 21 TAB) 10 MG (21) TBPK tablet Take as directed. 6,5,4,3,2,1 21 tablet 0   Probiotic Product (PROBIOTIC DAILY PO) Take by mouth.     propranolol  ER (INDERAL  LA) 80 MG 24 hr capsule Take 2 capsules (160 mg total) by mouth 2 (two) times daily. 60 capsule 0   tiZANidine (ZANAFLEX) 2 MG tablet Take 2 mg by mouth 2 (two) times daily as needed.     No current facility-administered medications for this visit.    Allergies as of 01/13/2024 - Review Complete 01/13/2024  Allergen Reaction Noted   Aspirin Other (See Comments) 07/28/2012   Beta adrenergic blockers Nausea And Vomiting 01/13/2023   Penicillins Other (See Comments) 08/14/2010   Statins Other (See Comments) 01/13/2023    Social History   Socioeconomic History   Marital status: Married    Spouse name: Not on file   Number of children: Not on file   Years of education: Not on file   Highest education level: Not on file  Occupational History   Not on file  Tobacco Use   Smoking status: Former    Current packs/day: 0.00    Types: Cigarettes    Quit date: 06/14/1985    Years since quitting: 38.6   Smokeless tobacco: Never   Tobacco comments:    Former smoker 03/19/22  Vaping Use   Vaping status: Never Used  Substance and Sexual Activity   Alcohol use: No   Drug use: No   Sexual activity: Not on file  Other Topics Concern   Not on file  Social History Narrative   Not on file   Social Drivers of Health   Financial Resource  Strain: Not on file  Food Insecurity: Low Risk  (01/05/2024)   Received from [facility]   Hunger Vital Sign    Within the past 12 months, you worried that your food would run out before you got money to buy more: Never true    Within the past 12 months, the food you bought just didn't last and you didn't have money to get  more. : Never true  Transportation Needs: No Transportation Needs (01/05/2024)   Received from [employer]    In the past 12 months, has lack of reliable transportation kept you from medical appointments, meetings, work or from getting things needed for daily living? : No  Physical Activity: Not on file  Stress: Not on file  Social Connections: Not on file    Review of systems General: negative for malaise, night sweats, fever, chills, weight loss Neck: Negative for lumps, goiter, pain and significant neck swelling Resp: Negative for cough, wheezing, dyspnea at rest CV: Negative for chest pain, leg swelling, palpitations, orthopnea GI: denies hematochezia, nausea, vomiting, diarrhea, constipation, odyonophagia, early satiety +weight loss +melena +upper abdominal pain +dysphagia  MSK: Negative for joint pain or swelling, back pain, and muscle pain. Derm: Negative for itching or rash Psych: Denies depression, anxiety, memory loss, confusion. No homicidal or suicidal ideation.  Heme: Negative for prolonged bleeding, bruising easily, and swollen nodes. Endocrine: Negative for cold or heat intolerance, polyuria, polydipsia and goiter. Neuro: negative for tremor, gait imbalance, syncope and seizures. The remainder of the review of systems is noncontributory.  Physical Exam: BP 126/76   Pulse 87   Temp 97.8 F (36.6 C)   Ht 5' 3 (1.6 m)   Wt 156 lb 6.4 oz (70.9 kg)   BMI 27.71 kg/m  General:   Alert and oriented. No distress noted. Pleasant and cooperative.  Head:  Normocephalic and atraumatic. Eyes:  Conjuctiva clear without scleral  icterus. Mouth:  Oral mucosa pink and moist. Good dentition. No lesions. Heart: Normal rate and rhythm, s1 and s2 heart sounds present.  Lungs: Clear lung sounds in all lobes. Respirations equal and unlabored. Abdomen:  +BS, soft,  and non-distended. TTP of epigastric region. No rebound or guarding. No HSM or masses noted. Derm: No palmar erythema or jaundice Msk:  Symmetrical without gross deformities. Normal posture. Extremities:  Without edema. Neurologic:  Alert and  oriented x4 Psych:  Alert and cooperative. Normal mood and affect.  Invalid input(s): 6 MONTHS   ASSESSMENT: Savannah Kirk is a 89 y.o. female presenting today for melena, upper abdominal pain, weight loss and heme positive stool  Patient with recent intake of aleve, prednisone  for carpal tunnel who had sudden onset of melena Monday with slight drop in hgb to 11 from baseline around 12. She endorses stool color is back to normal but she has upper abdominal pain. Appetite is decreased, some unintentional weight loss over the past few months. Presentation is certainly concerning for PUD, gastritis, duodenitis in setting of NSAIDs/steroids. At this time I am recommending once daily PPI, carafate  1g QID and EGD for further evaluation. As she had diarrhea from protonix , will try omeprazole  instead. We discussed avoiding all NSAIDs and should likely avoid prednisone  as these can be insulting to the GI tract as well. She can take tylenol  as needed and may need to discuss other pain alternatives for now with her orthopedic doctor. Indications, risks and benefits of procedure discussed in detail with patient. Patient verbalized understanding and is in agreement to proceed with EGD.  PLAN:  -start omeprazole  40mg  daily -schedule EGD ASA III  -avoid NSAIDs/prednisone  for now -carafate  1g QID (dissolve tablet in 1/2 oz water, mix and drink slurry)  All questions were answered, patient verbalized understanding and is in agreement with  plan as outlined above.   Follow Up: 2 months   Khyren Hing L. Dredyn Gubbels, MSN, APRN, AGNP-C Adult-Gerontology Nurse Practitioner  [facility]  I have reviewed the note and agree with the APP's assessment as described in this progress note  Toribio Fortune, MD Gastroenterology and Hepatology [facility]

## 2024-01-18 ENCOUNTER — Encounter: Payer: Self-pay | Admitting: *Deleted

## 2024-01-18 NOTE — Telephone Encounter (Signed)
 Pt informed that pre-op appointment is scheduled for Tuesday 01/25/24 at 1:45 pm.

## 2024-01-24 NOTE — Telephone Encounter (Signed)
 Thanks for the update

## 2024-01-24 NOTE — Telephone Encounter (Signed)
 Pt is cancelling her procedure on 01/28/24. She says she has a lot going on right now and will be having hand surgery in the future. She wishes to wait on EGD until after the hand surgery. She says she will call back to reschedule.

## 2024-01-25 ENCOUNTER — Encounter (HOSPITAL_COMMUNITY)

## 2024-01-26 ENCOUNTER — Ambulatory Visit: Admitting: Physical Medicine and Rehabilitation

## 2024-01-26 ENCOUNTER — Other Ambulatory Visit: Payer: Self-pay

## 2024-01-26 VITALS — BP 130/82 | HR 98

## 2024-01-26 DIAGNOSIS — M5416 Radiculopathy, lumbar region: Secondary | ICD-10-CM

## 2024-01-26 MED ORDER — DEXAMETHASONE SODIUM PHOSPHATE 10 MG/ML IJ SOLN
15.0000 mg | Freq: Once | INTRAMUSCULAR | Status: AC
  Administered 2024-01-26: 15 mg

## 2024-01-26 NOTE — Progress Notes (Signed)
 Pain Scale   Average Pain 4 Patient advising she has chronic lower back pain radiating to left leg area in the AM and when she is walking. Patient advising her pain decreases when she sits of lays down        +Driver, -BT, -Dye Allergies.

## 2024-01-26 NOTE — Patient Instructions (Signed)

## 2024-01-28 ENCOUNTER — Ambulatory Visit (HOSPITAL_COMMUNITY): Admission: RE | Admit: 2024-01-28 | Source: Home / Self Care | Admitting: Gastroenterology

## 2024-01-28 ENCOUNTER — Encounter (HOSPITAL_COMMUNITY): Admission: RE | Payer: Self-pay | Source: Home / Self Care

## 2024-01-28 SURGERY — EGD (ESOPHAGOGASTRODUODENOSCOPY)
Anesthesia: Choice

## 2024-01-30 NOTE — Progress Notes (Signed)
 Savannah Kirk - 89 y.o. female MRN [id number]  Date of birth: 10-Aug-1933  Office Visit Note: Visit Date: 01/26/2024 PCP: Sheryle Carwin, MD Referred by: Georgina Ozell LABOR, MD  Subjective: Chief Complaint  Patient presents with   Lower Back - Pain   HPI:  Savannah Kirk is a 89 y.o. female who comes in today at the request of Dr. Ozell Georgina for planned Bilateral L2-3 Lumbar Transforaminal epidural steroid injection with fluoroscopic guidance.  The patient has failed conservative care including home exercise, medications, time and activity modification.  This injection will be diagnostic and hopefully therapeutic.  Please see requesting physician notes for further details and justification.   ROS Otherwise per HPI.  Assessment & Plan: Visit Diagnoses:    ICD-10-CM   1. Lumbar radiculopathy  M54.16 XR C-ARM NO REPORT    Epidural Steroid injection    dexamethasone  (DECADRON ) injection 15 mg      Plan: No additional findings.   Meds & Orders:  Meds ordered this encounter  Medications   dexamethasone  (DECADRON ) injection 15 mg    Orders Placed This Encounter  Procedures   XR C-ARM NO REPORT   Epidural Steroid injection    Follow-up: Return for visit to requesting provider as needed.   Procedures: No procedures performed  Lumbosacral Transforaminal Epidural Steroid Injection - Sub-Pedicular Approach with Fluoroscopic Guidance  Patient: Savannah Kirk      Date of Birth: 06-Oct-1933 MRN: [id number] PCP: Sheryle Carwin, MD      Visit Date: 01/26/2024   Universal Protocol:    Date/Time: 01/26/2024  Consent Given By: the patient  Position: PRONE  Additional Comments: Vital signs were monitored before and after the procedure. Patient was prepped and draped in the usual sterile fashion. The correct patient, procedure, and site was verified.   Injection Procedure Details:   Procedure diagnoses: Lumbar radiculopathy [M54.16]    Meds Administered:  Meds ordered this encounter   Medications   dexamethasone  (DECADRON ) injection 15 mg    Laterality: Bilateral  Location/Site: L2  Needle:5.0 in., 22 ga.  Short bevel or Quincke spinal needle  Needle Placement: Transforaminal  Findings:    -Comments: Excellent flow of contrast along the nerve, nerve root and into the epidural space.  Procedure Details: After squaring off the end-plates to get a true AP view, the C-arm was positioned so that an oblique view of the foramen as noted above was visualized. The target area is just inferior to the nose of the scotty dog or sub pedicular. The soft tissues overlying this structure were infiltrated with 2-3 ml. of 1% Lidocaine  without Epinephrine.  The spinal needle was inserted toward the target using a trajectory view along the fluoroscope beam.  Under AP and lateral visualization, the needle was advanced so it did not puncture dura and was located close the 6 O'Clock position of the pedical in AP tracterory. Biplanar projections were used to confirm position. Aspiration was confirmed to be negative for CSF and/or blood. A 1-2 ml. volume of Isovue-250 was injected and flow of contrast was noted at each level. Radiographs were obtained for documentation purposes.   After attaining the desired flow of contrast documented above, a 0.5 to 1.0 ml test dose of 0.25% Marcaine was injected into each respective transforaminal space.  The patient was observed for 90 seconds post injection.  After no sensory deficits were reported, and normal lower extremity motor function was noted,   the above injectate was administered so that equal  amounts of the injectate were placed at each foramen (level) into the transforaminal epidural space.   Additional Comments:  The patient tolerated the procedure well Dressing: 2 x 2 sterile gauze and Band-Aid    Post-procedure details: Patient was observed during the procedure. Post-procedure instructions were reviewed.  Patient left the clinic  in stable condition.    Clinical History: CLINICAL DATA:  Low back pain and neurogenic claudication. History of prior lumbar surgery.   EXAM: MRI LUMBAR SPINE WITHOUT CONTRAST   TECHNIQUE: Multiplanar, multisequence MR imaging of the lumbar spine was performed. No intravenous contrast was administered.   COMPARISON:  MRI lumbar spine 02/13/2015.   FINDINGS: Segmentation:  Standard.   Alignment:  Trace anterolisthesis L4 on L5 is unchanged.   Vertebrae: No fracture, evidence of discitis, or bone lesion. The patient has undergone L4-5 fusion since the prior MRI.   Conus medullaris and cauda equina: Conus extends to the L1 level. The conus appears normal. There is some buckling of descending nerve roots superior to the L2-3 level.   Paraspinal and other soft tissues: Negative.   Disc levels:   T10-11 and T11-12 are imaged in the sagittal plane only and unremarkable.   T12-L1: Negative.   L1-2: There is a shallow disc bulge without stenosis.   L2-3: Bulky ligamentum flavum thickening and a broad-based disc bulge cause severe central canal stenosis. Moderate bilateral foraminal narrowing is worse on the right. Moderate facet arthropathy noted.   L3-4: Disc bulge and bulky ligamentum flavum thickening cause moderate to moderately severe central canal stenosis. Mild foraminal narrowing is worse on the right.   L4-5: Status post fusion. There is moderate facet arthropathy. Mild central canal and right lateral recess narrowing is seen. The foramina appear open.   L5-S1: Moderate to advanced facet degenerative change is present. There is a small protrusion in the left foramen causing mild foraminal stenosis. The central canal and right foramen are open.   IMPRESSION: 1. Severe central canal stenosis at L2-3 with moderate bilateral foraminal narrowing worse on the right. 2. Moderate to moderately severe central canal stenosis at L3-4 with mild bilateral foraminal  narrowing worse on the right. 3. Status post L4-5 fusion. Mild central canal and right lateral recess narrowing is seen at L4-5. 4. Small protrusion in the left foramen at L5-S1 causes mild foraminal narrowing.     Electronically Signed   By: Debby Prader M.D.   On: 12/26/2023 08:37     Objective:  VS:  HT:    WT:   BMI:     BP:130/82  HR:98bpm  TEMP: ( )  RESP:  Physical Exam Vitals and nursing note reviewed.  Constitutional:      General: She is not in acute distress.    Appearance: Normal appearance. She is not ill-appearing.  HENT:     Head: Normocephalic and atraumatic.     Right Ear: External ear normal.     Left Ear: External ear normal.  Eyes:     Extraocular Movements: Extraocular movements intact.  Cardiovascular:     Rate and Rhythm: Normal rate.     Pulses: Normal pulses.  Pulmonary:     Effort: Pulmonary effort is normal. No respiratory distress.  Abdominal:     General: There is no distension.     Palpations: Abdomen is soft.  Musculoskeletal:        General: Tenderness present.     Cervical back: Neck supple.     Right lower leg: No edema.  Left lower leg: No edema.     Comments: Patient has good distal strength with no pain over the greater trochanters.  No clonus or focal weakness.  Skin:    Findings: No erythema, lesion or rash.  Neurological:     General: No focal deficit present.     Mental Status: She is alert and oriented to person, place, and time.     Sensory: No sensory deficit.     Motor: No weakness or abnormal muscle tone.     Coordination: Coordination normal.  Psychiatric:        Mood and Affect: Mood normal.        Behavior: Behavior normal.      Imaging: No results found.

## 2024-01-30 NOTE — Procedures (Signed)
 Lumbosacral Transforaminal Epidural Steroid Injection - Sub-Pedicular Approach with Fluoroscopic Guidance  Patient: Savannah Kirk      Date of Birth: [birth year] MRN: [id number] PCP: Sheryle Carwin, MD      Visit Date: 01/26/2024   Universal Protocol:    Date/Time: 01/26/2024  Consent Given By: the patient  Position: PRONE  Additional Comments: Vital signs were monitored before and after the procedure. Patient was prepped and draped in the usual sterile fashion. The correct patient, procedure, and site was verified.   Injection Procedure Details:   Procedure diagnoses: Lumbar radiculopathy [M54.16]    Meds Administered:  Meds ordered this encounter  Medications   dexamethasone  (DECADRON ) injection 15 mg    Laterality: Bilateral  Location/Site: L2  Needle:5.0 in., 22 ga.  Short bevel or Quincke spinal needle  Needle Placement: Transforaminal  Findings:    -Comments: Excellent flow of contrast along the nerve, nerve root and into the epidural space.  Procedure Details: After squaring off the end-plates to get a true AP view, the C-arm was positioned so that an oblique view of the foramen as noted above was visualized. The target area is just inferior to the nose of the scotty dog or sub pedicular. The soft tissues overlying this structure were infiltrated with 2-3 ml. of 1% Lidocaine  without Epinephrine.  The spinal needle was inserted toward the target using a trajectory view along the fluoroscope beam.  Under AP and lateral visualization, the needle was advanced so it did not puncture dura and was located close the 6 O'Clock position of the pedical in AP tracterory. Biplanar projections were used to confirm position. Aspiration was confirmed to be negative for CSF and/or blood. A 1-2 ml. volume of Isovue-250 was injected and flow of contrast was noted at each level. Radiographs were obtained for documentation purposes.   After attaining the desired flow of contrast  documented above, a 0.5 to 1.0 ml test dose of 0.25% Marcaine was injected into each respective transforaminal space.  The patient was observed for 90 seconds post injection.  After no sensory deficits were reported, and normal lower extremity motor function was noted,   the above injectate was administered so that equal amounts of the injectate were placed at each foramen (level) into the transforaminal epidural space.   Additional Comments:  The patient tolerated the procedure well Dressing: 2 x 2 sterile gauze and Band-Aid    Post-procedure details: Patient was observed during the procedure. Post-procedure instructions were reviewed.  Patient left the clinic in stable condition.

## 2024-02-01 ENCOUNTER — Telehealth: Payer: Self-pay | Admitting: *Deleted

## 2024-02-01 ENCOUNTER — Other Ambulatory Visit: Payer: Self-pay | Admitting: *Deleted

## 2024-02-01 NOTE — Telephone Encounter (Signed)
Called pt and she is aware of pre-op appt.

## 2024-02-01 NOTE — Telephone Encounter (Signed)
 Pt called in to reschedule her EGD. She has been scheduled for 7/29. Aware will call back with pre-op appt. Discussed egd instructions over the phone.

## 2024-02-03 NOTE — Patient Instructions (Signed)
 Savannah Kirk  02/03/2024     @PREFPERIOPPHARMACY @   Your procedure is scheduled on  02/08/2024.   Report to [facility] at  0700 A.M.   Call this number if you have problems the morning of surgery:  [phone]  If you experience any cold or flu symptoms such as cough, fever, chills, shortness of breath, etc. between now and your scheduled surgery, please notify us  at the above number.   Remember:  Follow the diet instruction given to you by the office.   You may drink clear liquids until 0500 am on 02/08/2024.    Clear liquids allowed are:                    Water, Juice (No red color; non-citric and without pulp; diabetics please choose diet or no sugar options), Carbonated beverages (diabetics please choose diet or no sugar options), Clear Tea (No creamer, milk, or cream, including half & half and powdered creamer), Black Coffee Only (No creamer, milk or cream, including half & half and powdered creamer), and Clear Sports drink (No red color; diabetics please choose diet or no sugar options)    Take these medicines the morning of surgery with A SIP OF WATER          doxazosin , omeprazole , propranolol , tizanidine(if needed).    Do not wear jewelry, make-up or nail polish, including gel polish,  artificial nails, or any other type of covering on natural nails (fingers and  toes).  Do not wear lotions, powders, or perfumes, or deodorant.  Do not shave 48 hours prior to surgery.  Men may shave face and neck.  Do not bring valuables to the hospital.  [facility] is not responsible for any belongings or valuables.  Contacts, dentures or bridgework may not be worn into surgery.  Leave your suitcase in the car.  After surgery it may be brought to your room.  For patients admitted to the hospital, discharge time will be determined by your treatment team.  Patients discharged the day of surgery will not be allowed to drive home and must have someone with them for 24 hours.     Special instructions:   DO NOT smoke tobacco or vape for 24 hours before your procedure.  Please read over the following fact sheets that you were given. Anesthesia Post-op Instructions and Care and Recovery After Surgery      Upper Endoscopy, Adult, Care After After the procedure, it is common to have a sore throat. It is also common to have: Mild stomach pain or discomfort. Bloating. Nausea. Follow these instructions at home: The instructions below may help you care for yourself at home. Your health care provider may give you more instructions. If you have questions, ask your health care provider. If you were given a sedative during the procedure, it can affect you for several hours. Do not drive or operate machinery until your health care provider says that it is safe. If you will be going home right after the procedure, plan to have a responsible adult: Take you home from the hospital or clinic. You will not be allowed to drive. Care for you for the time you are told. Follow instructions from your health care provider about what you may eat and drink. Return to your normal activities as told by your health care provider. Ask your health care provider what activities are safe for you. Take over-the-counter and prescription medicines only as  told by your health care provider. Contact a health care provider if you: Have a sore throat that lasts longer than one day. Have trouble swallowing. Have a fever. Get help right away if you: Vomit blood or your vomit looks like coffee grounds. Have bloody, black, or tarry stools. Have a very bad sore throat or you cannot swallow. Have difficulty breathing or very bad pain in your chest or abdomen. These symptoms may be an emergency. Get help right away. Call 911. Do not wait to see if the symptoms will go away. Do not drive yourself to the hospital. Summary After the procedure, it is common to have a sore throat, mild stomach  discomfort, bloating, and nausea. If you were given a sedative during the procedure, it can affect you for several hours. Do not drive until your health care provider says that it is safe. Follow instructions from your health care provider about what you may eat and drink. Return to your normal activities as told by your health care provider. This information is not intended to replace advice given to you by your health care provider. Make sure you discuss any questions you have with your health care provider. Document Revised: 10/08/2021 Document Reviewed: 10/08/2021 Elsevier Patient Education  2024 [employer]General Anesthesia, Adult, Care After The following information offers guidance on how to care for yourself after your procedure. Your health care provider may also give you more specific instructions. If you have problems or questions, contact your health care provider. What can I expect after the procedure? After the procedure, it is common for people to: Have pain or discomfort at the IV site. Have nausea or vomiting. Have a sore throat or hoarseness. Have trouble concentrating. Feel cold or chills. Feel weak, sleepy, or tired (fatigue). Have soreness and body aches. These can affect parts of the body that were not involved in surgery. Follow these instructions at home: For the time period you were told by your health care provider:  Rest. Do not participate in activities where you could fall or become injured. Do not drive or use machinery. Do not drink alcohol. Do not take sleeping pills or medicines that cause drowsiness. Do not make important decisions or sign legal documents. Do not take care of children on your own. General instructions Drink enough fluid to keep your urine pale yellow. If you have sleep apnea, surgery and certain medicines can increase your risk for breathing problems. Follow instructions from your health care provider about wearing your sleep  device: Anytime you are sleeping, including during daytime naps. While taking prescription pain medicines, sleeping medicines, or medicines that make you drowsy. Return to your normal activities as told by your health care provider. Ask your health care provider what activities are safe for you. Take over-the-counter and prescription medicines only as told by your health care provider. Do not use any products that contain nicotine or tobacco. These products include cigarettes, chewing tobacco, and vaping devices, such as e-cigarettes. These can delay incision healing after surgery. If you need help quitting, ask your health care provider. Contact a health care provider if: You have nausea or vomiting that does not get better with medicine. You vomit every time you eat or drink. You have pain that does not get better with medicine. You cannot urinate or have bloody urine. You develop a skin rash. You have a fever. Get help right away if: You have trouble breathing. You have chest pain. You vomit blood. These symptoms may be  an emergency. Get help right away. Call 911. Do not wait to see if the symptoms will go away. Do not drive yourself to the hospital. Summary After the procedure, it is common to have a sore throat, hoarseness, nausea, vomiting, or to feel weak, sleepy, or fatigue. For the time period you were told by your health care provider, do not drive or use machinery. Get help right away if you have difficulty breathing, have chest pain, or vomit blood. These symptoms may be an emergency. This information is not intended to replace advice given to you by your health care provider. Make sure you discuss any questions you have with your health care provider. Document Revised: 09/26/2021 Document Reviewed: 09/26/2021 Elsevier Patient Education  2024 [employer].

## 2024-02-04 ENCOUNTER — Other Ambulatory Visit: Payer: Self-pay

## 2024-02-04 ENCOUNTER — Encounter (HOSPITAL_COMMUNITY)
Admission: RE | Admit: 2024-02-04 | Discharge: 2024-02-04 | Disposition: A | Discharge status: Home / Self Care | Source: Ambulatory Visit | Attending: Gastroenterology | Admitting: Gastroenterology

## 2024-02-04 ENCOUNTER — Encounter (HOSPITAL_COMMUNITY): Payer: Self-pay

## 2024-02-04 VITALS — BP 139/82 | HR 95 | Resp 18 | Ht 63.0 in | Wt 156.3 lb

## 2024-02-04 DIAGNOSIS — I452 Bifascicular block: Secondary | ICD-10-CM | POA: Insufficient documentation

## 2024-02-04 DIAGNOSIS — I10 Essential (primary) hypertension: Secondary | ICD-10-CM | POA: Insufficient documentation

## 2024-02-04 DIAGNOSIS — Z79899 Other long term (current) drug therapy: Secondary | ICD-10-CM | POA: Insufficient documentation

## 2024-02-04 DIAGNOSIS — Z01818 Encounter for other preprocedural examination: Secondary | ICD-10-CM | POA: Diagnosis not present

## 2024-02-04 DIAGNOSIS — I4891 Unspecified atrial fibrillation: Secondary | ICD-10-CM | POA: Diagnosis not present

## 2024-02-04 LAB — BASIC METABOLIC PANEL WITH GFR
Anion gap: 14 (ref 5–15)
BUN: 14 mg/dL (ref 8–23)
CO2: 26 mmol/L (ref 22–32)
Calcium: 9.2 mg/dL (ref 8.9–10.3)
Chloride: 96 mmol/L — ABNORMAL LOW (ref 98–111)
Creatinine, Ser: 0.88 mg/dL (ref 0.44–1.00)
GFR, Estimated: >60 mL/min (ref >60)
Glucose, Bld: 102 mg/dL — ABNORMAL HIGH (ref 70–99)
Potassium: 3 mmol/L — ABNORMAL LOW (ref 3.5–5.1)
Sodium: 136 mmol/L (ref 135–145)

## 2024-02-04 NOTE — Pre-Procedure Instructions (Signed)
 Potassium of 3.0 routed to Dr Henreitta.

## 2024-02-07 ENCOUNTER — Ambulatory Visit (INDEPENDENT_AMBULATORY_CARE_PROVIDER_SITE_OTHER): Payer: Self-pay | Admitting: Gastroenterology

## 2024-02-07 MED ORDER — POTASSIUM CHLORIDE CRYS ER 20 MEQ PO TBCR: 40.0000 meq | EXTENDED_RELEASE_TABLET | Freq: Every day | ORAL | 0 refills | Status: AC

## 2024-02-08 ENCOUNTER — Encounter (HOSPITAL_COMMUNITY): Payer: Self-pay | Admitting: Gastroenterology

## 2024-02-08 ENCOUNTER — Ambulatory Visit (HOSPITAL_COMMUNITY): Admitting: Anesthesiology

## 2024-02-08 ENCOUNTER — Ambulatory Visit (HOSPITAL_COMMUNITY)
Admission: RE | Admit: 2024-02-08 | Discharge: 2024-02-08 | Disposition: A | Discharge status: Home / Self Care | Attending: Gastroenterology | Admitting: Gastroenterology

## 2024-02-08 ENCOUNTER — Encounter (HOSPITAL_COMMUNITY)
Admission: RE | Disposition: A | Discharge status: Home / Self Care | Payer: Self-pay | Source: Home / Self Care | Attending: Gastroenterology

## 2024-02-08 DIAGNOSIS — K2970 Gastritis, unspecified, without bleeding: Secondary | ICD-10-CM | POA: Insufficient documentation

## 2024-02-08 DIAGNOSIS — I10 Essential (primary) hypertension: Secondary | ICD-10-CM | POA: Insufficient documentation

## 2024-02-08 DIAGNOSIS — R1310 Dysphagia, unspecified: Secondary | ICD-10-CM | POA: Insufficient documentation

## 2024-02-08 DIAGNOSIS — R630 Anorexia: Secondary | ICD-10-CM | POA: Diagnosis not present

## 2024-02-08 DIAGNOSIS — R1319 Other dysphagia: Secondary | ICD-10-CM

## 2024-02-08 DIAGNOSIS — K209 Esophagitis, unspecified without bleeding: Secondary | ICD-10-CM

## 2024-02-08 DIAGNOSIS — Z79899 Other long term (current) drug therapy: Secondary | ICD-10-CM | POA: Diagnosis not present

## 2024-02-08 DIAGNOSIS — I6529 Occlusion and stenosis of unspecified carotid artery: Secondary | ICD-10-CM | POA: Insufficient documentation

## 2024-02-08 DIAGNOSIS — I701 Atherosclerosis of renal artery: Secondary | ICD-10-CM | POA: Insufficient documentation

## 2024-02-08 DIAGNOSIS — M1990 Unspecified osteoarthritis, unspecified site: Secondary | ICD-10-CM | POA: Insufficient documentation

## 2024-02-08 DIAGNOSIS — I739 Peripheral vascular disease, unspecified: Secondary | ICD-10-CM | POA: Diagnosis not present

## 2024-02-08 DIAGNOSIS — K2091 Esophagitis, unspecified with bleeding: Secondary | ICD-10-CM | POA: Diagnosis not present

## 2024-02-08 DIAGNOSIS — Z6827 Body mass index (BMI) 27.0-27.9, adult: Secondary | ICD-10-CM | POA: Diagnosis not present

## 2024-02-08 DIAGNOSIS — K2270 Barrett's esophagus without dysplasia: Secondary | ICD-10-CM | POA: Diagnosis not present

## 2024-02-08 DIAGNOSIS — K3189 Other diseases of stomach and duodenum: Secondary | ICD-10-CM | POA: Insufficient documentation

## 2024-02-08 DIAGNOSIS — K921 Melena: Secondary | ICD-10-CM | POA: Diagnosis not present

## 2024-02-08 DIAGNOSIS — Z87891 Personal history of nicotine dependence: Secondary | ICD-10-CM

## 2024-02-08 DIAGNOSIS — I4891 Unspecified atrial fibrillation: Secondary | ICD-10-CM | POA: Insufficient documentation

## 2024-02-08 DIAGNOSIS — K2971 Gastritis, unspecified, with bleeding: Secondary | ICD-10-CM | POA: Diagnosis not present

## 2024-02-08 DIAGNOSIS — K259 Gastric ulcer, unspecified as acute or chronic, without hemorrhage or perforation: Secondary | ICD-10-CM

## 2024-02-08 DIAGNOSIS — K2100 Gastro-esophageal reflux disease with esophagitis, without bleeding: Secondary | ICD-10-CM | POA: Diagnosis not present

## 2024-02-08 DIAGNOSIS — I4510 Unspecified right bundle-branch block: Secondary | ICD-10-CM | POA: Diagnosis not present

## 2024-02-08 DIAGNOSIS — I499 Cardiac arrhythmia, unspecified: Secondary | ICD-10-CM | POA: Diagnosis not present

## 2024-02-08 HISTORY — PX: ESOPHAGOGASTRODUODENOSCOPY: SHX5428

## 2024-02-08 SURGERY — EGD (ESOPHAGOGASTRODUODENOSCOPY)
Anesthesia: General

## 2024-02-08 MED ORDER — PHENYLEPHRINE 80 MCG/ML (10ML) SYRINGE FOR IV PUSH (FOR BLOOD PRESSURE SUPPORT)
PREFILLED_SYRINGE | INTRAVENOUS | Status: DC | PRN
Start: 2024-02-08 — End: 2024-02-08
  Administered 2024-02-08: 160 ug via INTRAVENOUS
  Administered 2024-02-08: 80 ug via INTRAVENOUS

## 2024-02-08 MED ORDER — LACTATED RINGERS IV SOLN: INTRAVENOUS | Status: DC

## 2024-02-08 MED ORDER — PROPOFOL 10 MG/ML IV BOLUS
INTRAVENOUS | Status: DC | PRN
  Administered 2024-02-08: 70 mg via INTRAVENOUS
  Administered 2024-02-08: 125 ug/kg/min via INTRAVENOUS

## 2024-02-08 MED ORDER — PANTOPRAZOLE SODIUM 40 MG PO TBEC
40.0000 mg | DELAYED_RELEASE_TABLET | Freq: Every day | ORAL | 3 refills | Status: AC
Start: 2024-02-08 — End: ?

## 2024-02-08 NOTE — Op Note (Signed)
 [facility] Patient [name] Procedure Date: 02/08/2024 9:14 AM MRN: [id number] Date of Birth: December 13, 1933 Attending MD: Toribio Fortune , , [id number] CSN: [id number] Age: 89 Admit Type: Outpatient Procedure:                Upper GI endoscopy Indications:              Dysphagia, Melena Providers:                Toribio Fortune, Rosina Sprague, Jon Loge Referring MD:              Medicines:                Monitored Anesthesia Care Complications:            No immediate complications. Estimated Blood Loss:     Estimated blood loss: none. Procedure:                Pre-Anesthesia Assessment:                           - Prior to the procedure, a History and Physical                            was performed, and patient medications, allergies                            and sensitivities were reviewed. The patient's                            tolerance of previous anesthesia was reviewed.                           - The risks and benefits of the procedure and the                            sedation options and risks were discussed with the                            patient. All questions were answered and informed                            consent was obtained.                           - ASA Grade Assessment: III - A patient with severe                            systemic disease.                           After obtaining informed consent, the endoscope was                            passed under direct vision. Throughout the                            procedure, the patient's blood pressure, pulse,  and                            oxygen saturations were monitored continuously. The                            GIF-H190 ([id number]) scope was introduced through the                            mouth, and advanced to the second part of duodenum.                            The upper GI endoscopy was accomplished without                            difficulty. The patient  tolerated the procedure                            well. Scope In: 9:25:33 AM Scope Out: 9:32:58 AM Total Procedure Duration: 0 hours 7 minutes 25 seconds  Findings:      LA Grade A (one or more mucosal breaks less than 5 mm, not extending       between tops of 2 mucosal folds) esophagitis with no bleeding was found       at the gastroesophageal junction. A guidewire was placed and the scope       was withdrawn. Dilation was performed with a Savary dilator with no       resistance at 18 mm. The dilation site was examined following endoscope       reinsertion and showed mild mucosal disruption. This was biopsied with a       cold forceps for histology.      A few localized small erosions with no stigmata of recent bleeding were       found in the gastric body. Biopsies were taken with a cold forceps for       Helicobacter pylori testing.      The examined duodenum was normal. Impression:               - LA Grade A esophagitis with no bleeding. Dilated.                            Biopsied.                           - Erosive gastropathy with no stigmata of recent                            bleeding. Biopsied.                           - Normal examined duodenum. Moderate Sedation:      Per Anesthesia Care Recommendation:           - Discharge patient to home (ambulatory).                           - Resume previous diet.                           -  Await pathology results.                           - Use Prilosec (omeprazole ) 40 mg PO daily. Procedure Code(s):        --- Professional ---                           [phone], Esophagogastroduodenoscopy, flexible,                            transoral; with insertion of guide wire followed by                            passage of dilator(s) through esophagus over guide                            wire                           43239, 59, Esophagogastroduodenoscopy, flexible,                            transoral; with biopsy, single or  multiple Diagnosis Code(s):        --- Professional ---                           K20.90, Esophagitis, unspecified without bleeding                           K31.89, Other diseases of stomach and duodenum                           R13.10, Dysphagia, unspecified                           K92.1, Melena (includes Hematochezia) CPT copyright 2022 American Medical Association. All rights reserved. The codes documented in this report are preliminary and upon coder review may  be revised to meet current compliance requirements. Toribio Fortune, MD Toribio Fortune,  02/08/2024 9:38:04 AM This report has been signed electronically. Number of Addenda: 0

## 2024-02-08 NOTE — Interval H&P Note (Signed)
 History and Physical Interval Note:  02/08/2024 8:33 AM  Arlean VEAR Rouleau  has presented today for surgery, with the diagnosis of MELENA.  The various methods of treatment have been discussed with the patient and family. After consideration of risks, benefits and other options for treatment, the patient has consented to  Procedure(s) with comments: EGD (ESOPHAGOGASTRODUODENOSCOPY) (N/A) - 900AM, ASA 3 as a surgical intervention.  The patient's history has been reviewed, patient examined, no change in status, stable for surgery.  I have reviewed the patient's chart and labs.  Questions were answered to the patient's satisfaction.     Keneisha Heckart Castaneda Mayorga

## 2024-02-08 NOTE — Transfer of Care (Addendum)
 Immediate Anesthesia Transfer of Care Note  Patient: Savannah Kirk  Procedure(s) Performed: EGD (ESOPHAGOGASTRODUODENOSCOPY)  Patient Location: Short Stay  Anesthesia Type:General  Level of Consciousness: awake and patient cooperative  Airway & Oxygen Therapy: Patient Spontanous Breathing  Post-op Assessment: Report given to RN and Post -op Vital signs reviewed and stable  Post vital signs: Reviewed and stable  Last Vitals:  Vitals Value Taken Time  BP 111/68 02/08/24   0940  Temp 36.4 02/08/24   0940  Pulse 115 02/08/24   0940  Resp    SpO2 95% 02/08/24   0940    Last Pain:  Vitals:   02/08/24 0920  PainSc: 0-No pain         Complications: No notable events documented.

## 2024-02-08 NOTE — Discharge Instructions (Signed)
You are being discharged to home.  Resume your previous diet.  We are waiting for your pathology results.  Take Prilosec (omeprazole) 40 mg by mouth once a day.  

## 2024-02-08 NOTE — Anesthesia Preprocedure Evaluation (Signed)
 Anesthesia Evaluation  Patient identified by MRN, date of birth, ID band Patient awake    Reviewed: Allergy & Precautions, H&P , NPO status , Patient's Chart, lab work & pertinent test results, reviewed documented beta blocker date and time   Airway Mallampati: II  TM Distance: >3 FB Neck ROM: full    Dental no notable dental hx.    Pulmonary neg pulmonary ROS, shortness of breath, former smoker   Pulmonary exam normal breath sounds clear to auscultation       Cardiovascular Exercise Tolerance: Good hypertension, + Peripheral Vascular Disease  negative cardio ROS + dysrhythmias  Rhythm:regular Rate:Normal     Neuro/Psych negative neurological ROS  negative psych ROS   GI/Hepatic negative GI ROS, Neg liver ROS,,,  Endo/Other  negative endocrine ROS    Renal/GU negative Renal ROS  negative genitourinary   Musculoskeletal   Abdominal   Peds  Hematology negative hematology ROS (+)   Anesthesia Other Findings   Reproductive/Obstetrics negative OB ROS                              Anesthesia Physical Anesthesia Plan  ASA: 3  Anesthesia Plan: General   Post-op Pain Management:    Induction:   PONV Risk Score and Plan: Propofol  infusion  Airway Management Planned:   Additional Equipment:   Intra-op Plan:   Post-operative Plan:   Informed Consent: I have reviewed the patients History and Physical, chart, labs and discussed the procedure including the risks, benefits and alternatives for the proposed anesthesia with the patient or authorized representative who has indicated his/her understanding and acceptance.     Dental Advisory Given  Plan Discussed with: CRNA  Anesthesia Plan Comments:         Anesthesia Quick Evaluation

## 2024-02-08 NOTE — Anesthesia Procedure Notes (Signed)
 Date/Time: 02/08/2024 9:22 AM  Performed by: Para Jerelene CROME, CRNAOxygen Delivery Method: Nasal cannula

## 2024-02-09 ENCOUNTER — Ambulatory Visit (INDEPENDENT_AMBULATORY_CARE_PROVIDER_SITE_OTHER): Payer: Self-pay | Admitting: Gastroenterology

## 2024-02-09 LAB — SURGICAL PATHOLOGY

## 2024-02-09 NOTE — Anesthesia Postprocedure Evaluation (Signed)
 Anesthesia Post Note  Patient: Arlean VEAR Rouleau  Procedure(s) Performed: EGD (ESOPHAGOGASTRODUODENOSCOPY)  Patient location during evaluation: Phase II Anesthesia Type: General Level of consciousness: awake Pain management: pain level controlled Vital Signs Assessment: post-procedure vital signs reviewed and stable Respiratory status: spontaneous breathing and respiratory function stable Cardiovascular status: blood pressure returned to baseline and stable Postop Assessment: no headache and no apparent nausea or vomiting Anesthetic complications: no Comments: Late entry   No notable events documented.   Last Vitals:  Vitals:   02/08/24 0806 02/08/24 0940  BP: (!) 154/85 111/68  Pulse:  99  Resp: 18   Temp: 36.5 C (!) 36.4 C  SpO2: 99% 95%    Last Pain:  Vitals:   02/08/24 0940  TempSrc: Oral  PainSc: 0-No pain                 Yvonna JINNY Bosworth

## 2024-02-10 ENCOUNTER — Encounter (HOSPITAL_COMMUNITY): Payer: Self-pay | Admitting: Gastroenterology

## 2024-02-11 NOTE — Progress Notes (Signed)
procedure note and pathology result faxed to PCP

## 2024-02-14 ENCOUNTER — Ambulatory Visit (INDEPENDENT_AMBULATORY_CARE_PROVIDER_SITE_OTHER): Admitting: Orthopedic Surgery

## 2024-02-14 DIAGNOSIS — M48062 Spinal stenosis, lumbar region with neurogenic claudication: Secondary | ICD-10-CM

## 2024-02-14 MED ORDER — PREDNISONE 10 MG PO TABS: 10.0000 mg | ORAL_TABLET | Freq: Every day | ORAL | 0 refills | Status: AC

## 2024-02-14 NOTE — Progress Notes (Signed)
 Orthopedic Spine Surgery Office Note   Assessment: Patient is a 89 y.o. female with low back pain that radiates into her bilateral posterior thighs.  Resolved with flexion of the lumbar spine consistent with neurogenic claudication from her lumbar stenosis at L2/3 and L3/4     Plan: -Patient has tried Tylenol , Aleve, prednisone , tizanidine -Prescribed prednisone  for her -Talk about her remaining options for lumbar stenosis.  Explained that she can continue to live with it, do pain management, or undergo surgical management.  At this time, she feels that injections are controlling her pain and have been helpful so she wants to continue with that -Patient should return to office on an as-needed basis     Patient expressed understanding of the plan and all questions were answered to the patient's satisfaction.    ___________________________________________________________________________     History:   Patient is a 89 y.o. female who presents today for follow up on her lumbar spine and also wanted talk about her carpal tunnel.  She said that her hand pain is more significant than her back pain since getting her injection with Dr. Eldonna.  She still has pain in her low back that radiates into her bilateral posterior thighs.  She notes that the pain is worse when standing or walking.  Gets better if she sits down.  Her bigger issue though today is bilateral hand pain distal to the wrist.  She has been using nighttime bracing.  She had been seeing Dr. Murrell about this issue but felt that treatments were not improving her pain and she was ready for surgery but there was going to be a long time before she could get the surgery.  She said she recently scheduled appointment with Dr. Emmalene and has that upcoming.  She has been taking prednisone  from Dr. Murrell has found that helpful.     Treatments tried: Tylenol , Aleve, prednisone , tizanidine     Physical Exam:   General: no acute distress, appears  stated age Neurologic: alert, answering questions appropriately, following commands Respiratory: unlabored breathing on room air, symmetric chest rise Psychiatric: appropriate affect, normal cadence to speech     MSK (spine):   -Strength exam                                                   Left                  Right EHL                              5/5                  5/5 TA                                 5/5                  5/5 GSC                             5/5                  5/5 Knee extension  5/5                  5/5 Hip flexion                    5/5                  5/5   -Sensory exam                           Sensation intact to light touch in L3-S1 nerve distributions of bilateral lower extremities   Imaging: XRs of the lumbar spine from 11/24/2023 were previously independently reviewed and interpreted, showing disc height loss at L5/S1.  There is an interbody device in the former L4/5 disc space that appears in appropriate position.  Posterior instrumentation seen at L4/5 with no lucency around the screws.  No fracture or dislocation seen.   MRI of the lumbar spine from 11/26/2023 was previously independently reviewed and interpreted, showing central, lateral recess, and bilateral foraminal stenosis at L2/3.  Central stenosis at L3/4.  Posterior instrumentation at L4 and L5.  Interbody device in the L4/5 disc space.  No evidence of instrumentation complication (although MRI not the best modality to evaluate.      Patient [name] Patient MRN: [id number] Date of visit: 02/14/24  I spent 30 minutes in the room going over the patient's symptoms with her.  We spent a lot of time talking about carpal tunnel on the nonoperative treatments that can be tried.  She has done wrist bracing and injections.  She is interested in surgical management.  She is also interested in taking prednisone  since that has been helping.  I told her that long-term use of  prednisone  has problems in I do not recommend it.  She says that the only thing that helps her and she said there have been nights that she wishes she would not wake up because of the pain in her hands.  After this long conversation, I reluctantly prescribed her more prednisone .  I told her that she needs to be careful with this medication and if she is not having pain she should not take it.  She probably will also need a taper once she is done with this given the chronic use of it.  We also talked about her lumbar symptoms.  I told her that this time that she has tried all the conservative treatments that I would typically have the patient try.  Her remaining options would be to continue to live with it and learn to cope with it, pain management, or surgical management.

## 2024-03-06 ENCOUNTER — Ambulatory Visit: Admitting: Orthopedic Surgery

## 2024-03-06 DIAGNOSIS — G5603 Carpal tunnel syndrome, bilateral upper limbs: Secondary | ICD-10-CM | POA: Diagnosis not present

## 2024-03-06 NOTE — Progress Notes (Signed)
 Savannah Kirk - 89 y.o. female MRN [id number]  Date of birth: 29-Jul-1933  Office Visit Note: Visit Date: 03/06/2024 PCP: Sheryle Carwin, MD Referred by: Sheryle Carwin, MD  Subjective: No chief complaint on file.  HPI: Savannah Kirk is a pleasant 89 y.o. female who presents today for evaluation of bilateral hand numbness and tingling of the radial sided digits has been present for multiple years, worsening in nature.  She has been previously evaluated at an alternative group and has undergone previous workup with right sided EMG which did confirm carpal tunnel syndrome.  She has also undergone prior injection to the right carpal tunnel with temporary relief.  Her symptoms have since recurred.  She is also having ongoing nocturnal symptoms.  Has also trialed night bracing extensively without lasting relief.  States that her symptoms are worse on the right, left are manageable currently.  She has been referred to me by my partners for specific hand surgical evaluation.  Pertinent ROS were reviewed with the patient and found to be negative unless otherwise specified above in HPI.   Visit Reason: Bilateral Carpal Tunnel R>L Duration of symptoms: 4-5 months Hand dominance: right Occupation: retired Diabetic: No Smoking: No Heart/Lung History:Renal artery stenosis (HCC) Essential hypertension Carotid artery disease (HCC) Carotid stenosis A-fib Blood Thinners: none  Prior Testing/EMG: R-EMG indicated carpal tunnel syndrome Injections (Date): R carpal tunnel 12/03/23-temporary relief Treatments: prednisone - some relief Prior Surgery: none  Assessment & Plan: Visit Diagnoses:  1. Bilateral carpal tunnel syndrome     Plan: Extensive discussion was had with the patient today about her ongoing bilateral carpal tunnel syndrome that is refractory to conservative care.  Patient has both clinical and electrodiagnostic evidence to confirm this diagnosis on the right side which is the more symptomatic  side currently.  At this juncture, she is indicated for right open versus endoscopic carpal tunnel release.  Risks and benefits of both operations were discussed in detail today.  Understanding all risks and benefits, patient would like to have surgery done in the form of right open carpal tunnel release under local anesthesia, she would prefer the office setting.  Risks include but not limited to infection, bleeding, scarring, stiffness, nerve injury or vascular, tendon injury, risk of recurrence and need for subsequent operation were all discussed in detail.  Patient consented understanding the above.  Will move forward surgical scheduling.   Follow-up: No follow-ups on file.   Meds & Orders: No orders of the defined types were placed in this encounter.  No orders of the defined types were placed in this encounter.    Procedures: No procedures performed      Clinical History: CLINICAL DATA:  Low back pain and neurogenic claudication. History of prior lumbar surgery.   EXAM: MRI LUMBAR SPINE WITHOUT CONTRAST   TECHNIQUE: Multiplanar, multisequence MR imaging of the lumbar spine was performed. No intravenous contrast was administered.   COMPARISON:  MRI lumbar spine 02/13/2015.   FINDINGS: Segmentation:  Standard.   Alignment:  Trace anterolisthesis L4 on L5 is unchanged.   Vertebrae: No fracture, evidence of discitis, or bone lesion. The patient has undergone L4-5 fusion since the prior MRI.   Conus medullaris and cauda equina: Conus extends to the L1 level. The conus appears normal. There is some buckling of descending nerve roots superior to the L2-3 level.   Paraspinal and other soft tissues: Negative.   Disc levels:   T10-11 and T11-12 are imaged in the sagittal plane only and  unremarkable.   T12-L1: Negative.   L1-2: There is a shallow disc bulge without stenosis.   L2-3: Bulky ligamentum flavum thickening and a broad-based disc bulge cause severe central  canal stenosis. Moderate bilateral foraminal narrowing is worse on the right. Moderate facet arthropathy noted.   L3-4: Disc bulge and bulky ligamentum flavum thickening cause moderate to moderately severe central canal stenosis. Mild foraminal narrowing is worse on the right.   L4-5: Status post fusion. There is moderate facet arthropathy. Mild central canal and right lateral recess narrowing is seen. The foramina appear open.   L5-S1: Moderate to advanced facet degenerative change is present. There is a small protrusion in the left foramen causing mild foraminal stenosis. The central canal and right foramen are open.   IMPRESSION: 1. Severe central canal stenosis at L2-3 with moderate bilateral foraminal narrowing worse on the right. 2. Moderate to moderately severe central canal stenosis at L3-4 with mild bilateral foraminal narrowing worse on the right. 3. Status post L4-5 fusion. Mild central canal and right lateral recess narrowing is seen at L4-5. 4. Small protrusion in the left foramen at L5-S1 causes mild foraminal narrowing.     Electronically Signed   By: Debby Prader M.D.   On: 12/26/2023 08:37  She reports that she quit smoking about 38 years ago. Her smoking use included cigarettes. She has never used smokeless tobacco. No results for input(s): HGBA1C, LABURIC in the last 8760 hours.  Objective:   Vital Signs: There were no vitals taken for this visit.  Physical Exam  Gen: Well-appearing, in no acute distress; non-toxic CV: Regular Rate. Well-perfused. Warm.  Resp: Breathing unlabored on room air; no wheezing. Psych: Fluid speech in conversation; appropriate affect; normal thought process  Ortho Exam PHYSICAL EXAM:  General: Patient is well appearing and in no distress.   Skin and Muscle: No significant skin changes are apparent to upper extremities.   Range of Motion and Palpation Tests: Mobility is full about the elbows with flexion and  extension.  Forearm supination and pronation are 85/85 bilaterally.  Wrist flexion/extension is 75/65 bilaterally.  Digital flexion and extension are full.  Thumb opposition is full to the base of the small fingers bilaterally.    No cords or nodules are palpated.  No triggering is observed.    Neurologic, Vascular, Motor: Sensation is diminished to light touch in the bilateral median nerve distribution.    Thenar atrophy: Mild bilaterally Tinel sign: Positive bilateral carpal tunnel Carpal tunnel compression: Positive bilateral carpal tunnel Phalen test: Positive bilateral  Motor bilateral hand APB: 4+/5 bilaterally with mild thenar atrophy  Fingers pink and well perfused.  Capillary refill is brisk.     No results found for: HGBA1C   Imaging: No results found.  Past Medical/Family/Surgical/Social History: Medications & Allergies reviewed per EMR, new medications updated. Patient Active Problem List   Diagnosis Date Noted   Esophageal dysphagia 02/08/2024   Abdominal pain, epigastric 01/13/2024   Melena 01/13/2024   Loss of weight 01/13/2024   Age-related osteoporosis without current pathological fracture 12/29/2023   Secondary hypercoagulable state (HCC) 03/19/2022   Atypical chest pain 07/18/2021   Carotid stenosis 10/06/2019   Carotid artery disease (HCC) 08/30/2019   Dyspnea on exertion 08/31/2018   Hyperlipidemia 11/02/2014   Renal artery stenosis (HCC) 06/14/2013   Essential hypertension 06/14/2013   Past Medical History:  Diagnosis Date   Arthritis    Atrial fibrillation (HCC)    Cervical disc disease    Dyspnea  on exertion   History of kidney stones    Hyperlipidemia    Hypertension    Nosebleed    RBBB (right bundle branch block)    Renal artery stenosis (HCC)    Stenosis of carotid artery    Family History  Problem Relation Age of Onset   Hypertension Mother    Hypertension Sister    Diabetes Sister    Past Surgical History:  Procedure  Laterality Date   BACK SURGERY     lumbar interbody fusion   Cardiolite  12/205   negative   CAROTID ENDARTERECTOMY Left 10/06/2019   CAROTID STENT  07/06/2004   CATARACT EXTRACTION Bilateral    DOPPLER ECHOCARDIOGRAPHY     normal 2D. borderline concentric LVH   ENDARTERECTOMY Left 10/06/2019   Procedure: LEFT ENDARTERECTOMY CAROTID;  Surgeon: Serene Gaile ORN, MD;  Location: [facility] OR;  Service: Vascular;  Laterality: Left;   ESOPHAGOGASTRODUODENOSCOPY N/A 02/08/2024   Procedure: EGD (ESOPHAGOGASTRODUODENOSCOPY);  Surgeon: Eartha Flavors, Toribio, MD;  Location: [facility];  Service: Gastroenterology;  Laterality: N/A;  900AM, ASA 3   kidney stent     PATCH ANGIOPLASTY Left 10/06/2019   Procedure: Patch Angioplasty using a Xenosure Biologic Patch;  Surgeon: Serene Gaile ORN, MD;  Location: [facility] OR;  Service: Vascular;  Laterality: Left;   renal doppler  08/04/2006   patent right renal artery stent   TONSILLECTOMY     Social History   Occupational History   Not on file  Tobacco Use   Smoking status: Former    Current packs/day: 0.00    Types: Cigarettes    Quit date: 06/14/1985    Years since quitting: 38.7   Smokeless tobacco: Never   Tobacco comments:    Former smoker 03/19/22  Vaping Use   Vaping status: Never Used  Substance and Sexual Activity   Alcohol use: No   Drug use: No   Sexual activity: Not on file    Jehan Ranganathan Afton Alderton, M.D. [facility], Hand Surgery

## 2024-03-08 ENCOUNTER — Encounter (INDEPENDENT_AMBULATORY_CARE_PROVIDER_SITE_OTHER): Payer: Self-pay | Admitting: Gastroenterology

## 2024-03-09 ENCOUNTER — Other Ambulatory Visit: Payer: Self-pay

## 2024-03-09 DIAGNOSIS — G5603 Carpal tunnel syndrome, bilateral upper limbs: Secondary | ICD-10-CM

## 2024-03-14 ENCOUNTER — Other Ambulatory Visit: Payer: Self-pay | Admitting: Orthopedic Surgery

## 2024-03-14 ENCOUNTER — Ambulatory Visit: Admitting: Orthopedic Surgery

## 2024-03-14 DIAGNOSIS — G5601 Carpal tunnel syndrome, right upper limb: Secondary | ICD-10-CM | POA: Diagnosis not present

## 2024-03-14 MED ORDER — OXYCODONE HCL 5 MG PO TABS: 5.0000 mg | ORAL_TABLET | Freq: Four times a day (QID) | ORAL | 0 refills | Status: AC | PRN

## 2024-03-14 NOTE — Progress Notes (Addendum)
 Procedure Note  Patient: Savannah Kirk             Date of Birth: 1934-06-30           MRN: [id number]             Visit Date: 03/14/2024  Procedures: Visit Diagnoses:  1. Carpal tunnel syndrome, right upper limb      [name] MEDICAL RECORD NO: [id number] DATE OF BIRTH: 1934-04-16 FACILITY: [facility] LOCATION: [facility] PHYSICIAN: Savannah ALDERTON, MD   OPERATIVE REPORT   DATE OF PROCEDURE: 03/14/24    PREOPERATIVE DIAGNOSIS: Right carpal tunnel syndrome   POSTOPERATIVE DIAGNOSIS: Right carpal tunnel syndrome   PROCEDURE: Right open carpal tunnel release   SURGEON:  Savannah Kirk, M.D.   ASSISTANT: Savannah Rummer, PA   ANESTHESIA:  Local   INTRAVENOUS FLUIDS:  Per anesthesia flow sheet.   ESTIMATED BLOOD LOSS:  Minimal.   COMPLICATIONS:  None.   SPECIMENS:  none   TOURNIQUET TIME: 6 minutes    DISPOSITION:  Stable to PACU.   INDICATIONS: 89 year old female with clinical and electrodiagnostic evidence of right-sided carpal tunnel syndrome refractory to conservative care.  Patient was indicated for right open carpal tunnel release.  Risks and benefits of surgery were discussed including the risks of infection, bleeding, scarring, stiffness, nerve injury, vascular injury, tendon injury, need for subsequent operation, persistent symptoms.  She voiced understanding of these risks and elected to proceed.  OPERATIVE COURSE: Patient was seen and identified in the preoperative area and marked appropriately.  Surgical consent had been signed.  She was transferred to the procedure room and placed in supine position with the right upper extremity on an arm board.  10 cc of 1% lidocaine  with epinephrine was utilized for anesthetic purposes around the planned incisional site.  Right upper extremity was prepped and draped in normal sterile orthopedic fashion.  A surgical pause was performed between the surgeon and staff and all were in agreement as to the  patient, procedure, and site of procedure.  Tourniquet was placed and padded appropriately to the right upper arm.  The arm was exsanguinated and the tourniquet was inflated to 250 mmHg.  Longitudinal incision was designed in the thenar crease in line with the radial border of the ring finger, from intersection of Kaplan's cardinal line down to level of the distal wrist crease. Incision was carried down utilizing 15 blade. Blunt dissection was performed, palmar fascia was identified and incised sharply utilizing a Beaver blade. Careful dissection was performed down, thenar musculature was bluntly elevated and the transcarpal ligament was identified.  A Beaver blade was then utilized to divide the transcarpal ligament in a distal to proximal fashion.  Fat surrounding the palmar arch was encountered to confirm appropriate distal release.  At the level of the wrist crease, skin flaps were elevated to allow for release of the proximal portion of transverse carpal ligament as well as the antebrachial fascia into the forearm. Appropriate decompression was noted of the median nerve, care was taken to protect the nerve in its entirety throughout. Once we were satisfied with our proximal and distal release, tourniquet was deflated and bipolar electrocautery was utilized for hemostasis.  Tourniquet time was 6 minutes.  Copious irrigation was performed followed by closure utilizing 4-0 nylon in standard fashion for the skin surface. Sterile dressings were applied followed by a loosefitting soft hand dressing.  Fingertips were pink with brisk capillary refill after deflation of tourniquet.  The operative  drapes were broken down.  The patient was taken to recovery area in stable condition.   Post-operative plan: The patient will be discharged home.  The patient will be non weight bearing on the right upper extremity in a soft dressing.   I will see the patient back in the office in 2 weeks for postoperative followup.     Savannah Damas, MD Electronically signed, 03/14/24

## 2024-03-16 DIAGNOSIS — D0439 Carcinoma in situ of skin of other parts of face: Secondary | ICD-10-CM | POA: Diagnosis not present

## 2024-03-27 NOTE — Therapy (Signed)
 OUTPATIENT OCCUPATIONAL THERAPY ORTHO EVALUATION AND TREATMENT NOTE  Patient [name] MRN: [id number] DOB:1933/10/03, 89 y.o., female Today's Date: 03/28/2024  REFERRING PROVIDER: Arlinda Buster, MD   END OF SESSION:   Past Medical History:  Diagnosis Date   Arthritis    Atrial fibrillation (HCC)    Cervical disc disease    Dyspnea    on exertion   History of kidney stones    Hyperlipidemia    Hypertension    Nosebleed    RBBB (right bundle branch block)    Renal artery stenosis (HCC)    Stenosis of carotid artery    Past Surgical History:  Procedure Laterality Date   BACK SURGERY     lumbar interbody fusion   Cardiolite  12/205   negative   CAROTID ENDARTERECTOMY Left 10/06/2019   CAROTID STENT  07/06/2004   CATARACT EXTRACTION Bilateral    DOPPLER ECHOCARDIOGRAPHY     normal 2D. borderline concentric LVH   ENDARTERECTOMY Left 10/06/2019   Procedure: LEFT ENDARTERECTOMY CAROTID;  Surgeon: Serene Gaile ORN, MD;  Location: [facility] OR;  Service: Vascular;  Laterality: Left;   ESOPHAGOGASTRODUODENOSCOPY N/A 02/08/2024   Procedure: EGD (ESOPHAGOGASTRODUODENOSCOPY);  Surgeon: Eartha Flavors, Toribio, MD;  Location: [facility];  Service: Gastroenterology;  Laterality: N/A;  900AM, ASA 3   kidney stent     PATCH ANGIOPLASTY Left 10/06/2019   Procedure: Patch Angioplasty using a Xenosure Biologic Patch;  Surgeon: Serene Gaile ORN, MD;  Location: [facility] OR;  Service: Vascular;  Laterality: Left;   renal doppler  08/04/2006   patent right renal artery stent   TONSILLECTOMY     Patient Active Problem List   Diagnosis Date Noted   Esophageal dysphagia 02/08/2024   Abdominal pain, epigastric 01/13/2024   Melena 01/13/2024   Loss of weight 01/13/2024   Age-related osteoporosis without current pathological fracture 12/29/2023   Secondary hypercoagulable state (HCC) 03/19/2022   Atypical chest pain 07/18/2021   Carotid stenosis 10/06/2019   Carotid artery disease  (HCC) 08/30/2019   Dyspnea on exertion 08/31/2018   Hyperlipidemia 11/02/2014   Renal artery stenosis (HCC) 06/14/2013   Essential hypertension 06/14/2013     ONSET DATE:  DOS 03/14/24  REFERRING DIAG: G56.03 (ICD-10-CM) - Bilateral carpal tunnel syndrome   THERAPY DIAG:     Pain in right hand  Stiffness of right hand, not elsewhere classified  Paresthesia of skin  Rationale for Evaluation and Treatment: Rehabilitation  SUBJECTIVE:   SUBJECTIVE STATEMENT: The patient states hx of triggering in her index finger followed by paresthesia and pain in their hand and subsequent surgical release of the carpal tunnel. Now the patient states having some lingering paresthesia, stiffness, pain, decreased ability to make a fist and perform I/ADLs.  She has not noticed any triggering recently.   PERTINENT HISTORY: The patient is now approx 2 weeks s/p Rt hand CTR.   PRECAUTIONS: None relative to this evaluation and episode of care.   RED FLAGS: None   WEIGHT BEARING RESTRICTIONS: Yes: caution with weightbearing for the next 4-6 weeks, recommended less than 5lbs for next 2 weeks with affected hand  PAIN:  Are you having pain? Yes: NPRS scale: between mild and moderate now at rest  Pain location:  sx area Pain description: aching and sore Aggravating factors: gripping/squeezing Relieving factors: rest  FALLS: Has patient fallen in last 6 months? No, not a fall risk  PLOF: Independent with I/ADLs  PATIENT GOALS: To improve motion, function with affected surgical hand  NEXT MD VISIT: PRN    OBJECTIVE MEASURES:   ADLs: Overall ADLs: States decreased ability to grab, hold household objects, pain and difficulty to open containers, perform FMS tasks (manipulate fasteners on clothing).    Quick DASH: 48% impairment recorded today.   UPPER EXTREMITY ROM:     A/ROM Right eval  Wrist flexion 58  Wrist extension 45  (Blank rows = not tested)  Eval: Middle finger taken as  example of all fingers as it was the most stiff on the right hand now.                   Hand A/ROM Right eval  Full Fist Ability (or Gap to Distal Palmar Crease) Unable due to stiffness/soreness  Thumb Opposition  (Kapandji Scale)  3/10  Thumb MCP (0-60)   Thumb IP (0-80)   Index MCP (0-90)   Index PIP (0-100)   Index DIP (0-70)    Long MCP (0-90)  0- 64  Long PIP (0-100)  0- 80  Long DIP (0-70)  0- 49  Ring MCP (0-90)    Ring PIP (0-100)    Ring DIP (0-70)    Little MCP (0-90)    Little PIP (0-100)    Little DIP (0-70)    (Blank rows = not tested)   HAND STRENGTH & FUNCTION: Eval: Observed weakness in affected hand/arm, grossly 3-/5 MMT, but specific gripping and resistance training contraindicated today. Also at least mild observed coordination impairments with affected hand/arm due to stiffness and soreness. These deficits are expected to improve with HEP and recommendations.    COORDINATION: Eval: Mild observed coordination impairments with surgical hand, as seen by pain,stiffness, etc. Expected to improve with HEP and recommendations.   SENSATION: Eval:  Light touch mildly diminished especially through sx area. Expected to improve with HEP and recommendations.   EDEMA:   Eval:  Mildly swollen in surgical hand today.  Expected to improve with HEP and recommendations.   COGNITION: Eval: Overall cognitive status: WFL for evaluation today   OBSERVATIONS:   Eval: Surgical site is clean and no overt signs of infection, no drainage, signs of dehiscence, etc.  Tenderness and swelling is within normal limits for post-op timeframe.     TODAY'S TREATMENT:  Post-evaluation treatment:   The patient was given safety information for managing post-op wound, including not to soak wound, to keep clean and dry, to start with gentle scar mobilizations. The patient should monitor for signs of infection.  The patient was supplied with compressive gauze to help with swelling as needed.   The patient should contact the therapist or surgeon with any concerns immediately.   The patient should also avoid any strong gripping, push, pull, weight bearing or repetitive motion for the next month.  The patient  should not be doing painful activities.  The patient should not rest on their palm, keep the wrist bent for long time periods, or sleep on their hand. After a month, the patient can progressively return to all light, normal activities. Sports and heavy weight lifting should be withheld for a total of 3 months.   The patient was also educated (explanation and demonstration) on the following home exercise program including tolerable range of motion, gentle passive range of motion, scar care, progressive desensitization, prevention of soft tissue contractures, etc. The patient states understanding all directions and may be able to follow these things on her own at home without therapy follow-up-however she states some pain and apprehension and  would like to have therapy plan of care left open in case she may need to return.     CTR Exercises  - Turn [employer] Facing Up & Down  - 4 x daily - 15 reps - Bend and Pull Back Wrist SLOWLY  - 4 x daily - 15 reps - Tendon Glides  - 4 x daily - 5 reps - 3 second hold - Median Nerve Flossing  - 3 x daily - 5 reps - Wrist Prayer Stretch  - 4 x daily - 3 reps - 15 second hold - Full finger stretches   - 4 x daily - 3 reps - 15 second hold Patient Education - Scar Massage     PATIENT EDUCATION: Education details: See tx section above for details  Person educated: Patient Education method: Verbal Instruction, Teach back, Handouts  Education comprehension: States and demonstrates understanding   HOME EXERCISE PROGRAM: See tx section above for details    GOALS: Goals reviewed with patient? Yes   SHORT TERM GOALS: (STG required if POC>30 days) Target Date: 03/28/24  1.  Pt will demo/state understanding of initial HEP and therapist  recommendations to improve pain levels, improve motions and ability and eventually return to normal activities.   Goal status: MET   LONG TERM GOALS: (STG required if POC>30 days) Target Date: 05/12/24  1.  Pt will improve motion in the right hand to the ability to make a full fist and open her fingers completely. Goal status: Initial  2.  Pt will improve grip strength in the right hand to at least 20 pounds for functional grip and home use ability. Goal status: Initial    ASSESSMENT:  CLINICAL IMPRESSION: Patient is a 89 y.o. female who was seen today for occupational therapy evaluation for swelling, pain, weakness and decreased functional ability following carpal tunnel release procedure. The patient is appropriate for OT rehab services and benefited from treatment today. The patient got copious education/treatment today for self-care, wound management, exercises and how to transition to normal activities in the next 4-6 weeks. The patient was unsure if she could manage these recommendations independently, and so OT will leave plan of care open for the next 6 weeks for her to return if she is having any problems or exacerbations.  She did perform better at the end of today's treatment session than at the start, so this was encouraging that she may not need to return.  If she has not heard from after 6 weeks, she can be discharged assuming all goals were met.  The patient should follow up with the surgeon with any concerns.    PERFORMANCE DEFICITS: in functional skills including ADLs, IADLs, coordination, dexterity, sensation, edema, ROM, strength, pain, fascial restrictions, flexibility, Fine motor control, body mechanics, endurance, decreased knowledge of precautions, wound, and UE functional use, cognitive skills including problem solving and safety awareness, and psychosocial skills including coping strategies, environmental adaptation, and habits.   IMPAIRMENTS: are limiting patient from  ADLs, IADLs, rest and sleep, leisure, and social participation.   COMORBIDITIES: may have co-morbidities  that affects occupational performance. Patient will benefit from skilled OT to address above impairments and improve overall function.  MODIFICATION OR ASSISTANCE TO COMPLETE EVALUATION: No modification of tasks or assist necessary to complete an evaluation.  OT OCCUPATIONAL PROFILE AND HISTORY: Problem focused assessment: Including review of records relating to presenting problem.  CLINICAL DECISION MAKING: LOW - limited treatment options, no task modification necessary  REHAB POTENTIAL: Excellent  EVALUATION COMPLEXITY:  Low      PLAN:  OT FREQUENCY: 1xweek OT DURATION:  up to 6 weeks through 05/12/24 and up to 6 total visits as needed  PLANNED INTERVENTIONS: self care/ADL training, therapeutic exercise, therapeutic activity, neuromuscular re-education, manual therapy, scar mobilization, passive range of motion, splinting, ultrasound, fluidotherapy, compression bandaging, moist heat, cryotherapy, contrast bath, patient/family education, energy conservation, coping strategies training, and Re-evaluation  RECOMMENDED OTHER SERVICES: none now   CONSULTED AND AGREED WITH PLAN OF CARE: Patient  PLAN FOR NEXT SESSION:   See her back over the next 6 weeks as needed to help with any lingering symptoms or issues   Melvenia Ada, OTR/L, CHT 03/28/2024, 12:50 PM    Date of referral: 03/09/24 Referring provider: Dr Arlinda Referring diagnosis? G56.03 (ICD-10-CM) - Bilateral carpal tunnel syndrome  Treatment diagnosis? (if different than referring diagnosis) M79.641   What was this (referring dx) caused by? Surgery (Type: Right carpal tunnel release)  Nature of Condition: Initial Onset (within last 3 months)   Laterality: Rt  Current Functional Measure Score: DASH 48%  Objective measurements identify impairments when they are compared to normal values, the uninvolved  extremity, and prior level of function.  [x]  Yes  []  No  Objective assessment of functional ability: Moderate functional limitations   Briefly describe symptoms: Soreness, nerve pains, stiffness  How did symptoms start: Tingling and paresthesia followed by carpal tunnel release surgery  Average pain intensity:  Last 24 hours: 3-4/10  Past week: 3-4/10  How often does the pt experience symptoms? Occasionally  How much have the symptoms interfered with usual daily activities? Moderately  How has condition changed since care began at this facility? NA - initial visit  In general, how is the patients overall health? Very Good   BACK PAIN (STarT Back Screening Tool) No

## 2024-03-28 ENCOUNTER — Ambulatory Visit: Admitting: Rehabilitative and Restorative Service Providers"

## 2024-03-28 ENCOUNTER — Ambulatory Visit (INDEPENDENT_AMBULATORY_CARE_PROVIDER_SITE_OTHER): Admitting: Orthopedic Surgery

## 2024-03-28 ENCOUNTER — Encounter: Payer: Self-pay | Admitting: Rehabilitative and Restorative Service Providers"

## 2024-03-28 DIAGNOSIS — Z9889 Other specified postprocedural states: Secondary | ICD-10-CM

## 2024-03-28 DIAGNOSIS — R202 Paresthesia of skin: Secondary | ICD-10-CM | POA: Diagnosis not present

## 2024-03-28 DIAGNOSIS — M79641 Pain in right hand: Secondary | ICD-10-CM

## 2024-03-28 DIAGNOSIS — M25641 Stiffness of right hand, not elsewhere classified: Secondary | ICD-10-CM | POA: Diagnosis not present

## 2024-03-28 NOTE — Progress Notes (Unsigned)
   Savannah Kirk - 89 y.o. female MRN [id number]  Date of birth: 06/29/1934  Office Visit Note: Visit Date: 03/28/2024 PCP: Sheryle Carwin, MD Referred by: Sheryle Carwin, MD  Subjective:  HPI: Savannah Kirk is a 89 y.o. female who presents today for follow up 2 weeks status post right open carpal tunnel release.  Pertinent ROS were reviewed with the patient and found to be negative unless otherwise specified above in HPI.   Assessment & Plan: Visit Diagnoses: No diagnosis found.  Plan: ***  Follow-up: No follow-ups on file.   Meds & Orders: No orders of the defined types were placed in this encounter.  No orders of the defined types were placed in this encounter.    Procedures: No procedures performed       Objective:   Vital Signs: There were no vitals taken for this visit.  Ortho Exam ***  Imaging: No results found.   Anshul Afton Alderton, M.D. [facility], Hand Surgery

## 2024-04-21 DIAGNOSIS — Z79899 Other long term (current) drug therapy: Secondary | ICD-10-CM | POA: Diagnosis not present

## 2024-04-21 DIAGNOSIS — I708 Atherosclerosis of other arteries: Secondary | ICD-10-CM | POA: Diagnosis not present

## 2024-04-25 ENCOUNTER — Ambulatory Visit (INDEPENDENT_AMBULATORY_CARE_PROVIDER_SITE_OTHER): Admitting: Orthopedic Surgery

## 2024-04-25 DIAGNOSIS — Z9889 Other specified postprocedural states: Secondary | ICD-10-CM

## 2024-04-25 DIAGNOSIS — G5793 Unspecified mononeuropathy of bilateral lower limbs: Secondary | ICD-10-CM

## 2024-04-25 NOTE — Progress Notes (Signed)
   Savannah Kirk - 89 y.o. female MRN [id number]  Date of birth: 08-03-1933  Office Visit Note: Visit Date: 04/25/2024 PCP: Sheryle Carwin, MD Referred by: Sheryle Carwin, MD  Subjective:  HPI: AILED DEFIBAUGH is a 89 y.o. female who presents today for follow up 6 weeks status post right open carpal tunnel release.  She is doing very well overall, numbness and tingling has improved significantly, range of motion has improved significantly.  Pertinent ROS were reviewed with the patient and found to be negative unless otherwise specified above in HPI.   Assessment & Plan: Visit Diagnoses:  1. S/P carpal tunnel release   2. Neuropathic pain of both feet      Plan: She continues to do very well postoperatively.  She can continue with activities as tolerated moving forward.  She did mention that she was interested in seeing somebody for ongoing bilateral foot pain in the setting of previous neuropathy.  Referral can be placed today to Dr. Crist team for consultation.  I did explain that with ongoing neuropathy, surgical options are quite limited.  Follow-up: No follow-ups on file.   Meds & Orders: No orders of the defined types were placed in this encounter.   Orders Placed This Encounter  Procedures   Ambulatory referral to Orthopedic Surgery     Procedures: No procedures performed       Objective:   Vital Signs: There were no vitals taken for this visit.  Ortho Exam Right hand: - Well-healed palmar incision, skin edges well-approximated without erythema or drainage - Composite fist without restriction - Sensation intact to light touch in the median nerve distribution - 4+/5 APB no thenar atrophy    Imaging: No results found.   Yaneli Keithley Afton Alderton, M.D. [facility], Hand Surgery

## 2024-04-28 DIAGNOSIS — E876 Hypokalemia: Secondary | ICD-10-CM | POA: Diagnosis not present

## 2024-04-28 DIAGNOSIS — K2900 Acute gastritis without bleeding: Secondary | ICD-10-CM | POA: Diagnosis not present

## 2024-04-28 DIAGNOSIS — G5603 Carpal tunnel syndrome, bilateral upper limbs: Secondary | ICD-10-CM | POA: Diagnosis not present

## 2024-04-28 DIAGNOSIS — I10 Essential (primary) hypertension: Secondary | ICD-10-CM | POA: Diagnosis not present

## 2024-04-28 DIAGNOSIS — Z23 Encounter for immunization: Secondary | ICD-10-CM | POA: Diagnosis not present

## 2024-04-28 DIAGNOSIS — N183 Chronic kidney disease, stage 3 unspecified: Secondary | ICD-10-CM | POA: Diagnosis not present

## 2024-05-03 ENCOUNTER — Encounter: Payer: Self-pay | Admitting: Family

## 2024-05-03 ENCOUNTER — Ambulatory Visit (INDEPENDENT_AMBULATORY_CARE_PROVIDER_SITE_OTHER): Admitting: Family

## 2024-05-03 DIAGNOSIS — G5793 Unspecified mononeuropathy of bilateral lower limbs: Secondary | ICD-10-CM

## 2024-05-03 MED ORDER — PREGABALIN 50 MG PO CAPS: 50.0000 mg | ORAL_CAPSULE | Freq: Three times a day (TID) | ORAL | 0 refills | Status: DC

## 2024-05-03 NOTE — Progress Notes (Signed)
 Office Visit Note   Patient: Savannah Kirk           Date of Birth: 12/23/1933           MRN: [id number] Visit Date: 05/03/2024              Requested by: Arlinda Buster, MD [address] [place],  [place] [address] PCP: Sheryle Carwin, MD  Chief Complaint  Patient presents with   Right Foot - Pain   Left Foot - Pain      HPI: The patient is an 89 year old woman who presents today for evaluation of bilateral feet.  She has had a 3-year history of numbness tingling and pain to the plantar aspect bilateral feet  Worst pain with first waking in the morning as well as at the end of the day she describes pain as burning  Has had instability and difficulty with balance   Has used gabapentin in the past but had an adverse reaction (burping).  Prefers not to try this again  Assessment & Plan: Visit Diagnoses:  1. Neuropathic pain of both feet     Plan: Will trial her on a course of Lyrica 50 mg 3 times daily.  She will follow-up in 1 month.  Follow-Up Instructions: No follow-ups on file.   Ortho Exam  Patient is alert, oriented, no adenopathy, well-dressed, normal affect, normal respiratory effort. On examination bilateral feet she does have valgus deformity of bilateral great toes.  There is no erythema.  Palpable dorsalis pedis and posterior tibial pulses. Some decreased sensation plantar aspect. Does not lack protective sensation.  Onychomycosis x 4    Imaging: No results found. No images are attached to the encounter.  Labs: No results found for: HGBA1C, ESRSEDRATE, CRP, LABURIC, REPTSTATUS, GRAMSTAIN, CULT, LABORGA   Lab Results  Component Value Date   ALBUMIN  4.4 06/12/2020   ALBUMIN  4.0 10/04/2019   ALBUMIN  3.8 09/07/2019    No results found for: MG No results found for: VD25OH  No results found for: PREALBUMIN    Latest Ref Rng & Units 11/17/2021   10:55 PM 10/07/2019    3:39 AM 10/04/2019    1:28 PM  CBC EXTENDED  WBC 4.0 - 10.5  K/uL 7.3  9.5  6.7   RBC 3.87 - 5.11 MIL/uL 4.18  3.79  4.31   Hemoglobin 12.0 - 15.0 g/dL 87.2  88.2  86.5   HCT 36.0 - 46.0 % 39.2  35.5  40.8   Platelets 150 - 400 K/uL 231  199  236      There is no height or weight on file to calculate BMI.  Orders:  No orders of the defined types were placed in this encounter.  Meds ordered this encounter  Medications   pregabalin (LYRICA) 50 MG capsule    Sig: Take 1 capsule (50 mg total) by mouth 3 (three) times daily.    Dispense:  90 capsule    Refill:  0     Procedures: No procedures performed  Clinical Data: No additional findings.  ROS:  All other systems negative, except as noted in the HPI. Review of Systems  Objective: Vital Signs: There were no vitals taken for this visit.  Specialty Comments:  CLINICAL DATA:  Low back pain and neurogenic claudication. History of prior lumbar surgery.   EXAM: MRI LUMBAR SPINE WITHOUT CONTRAST   TECHNIQUE: Multiplanar, multisequence MR imaging of the lumbar spine was performed. No intravenous contrast was administered.   COMPARISON:  MRI lumbar spine 02/13/2015.   FINDINGS: Segmentation:  Standard.   Alignment:  Trace anterolisthesis L4 on L5 is unchanged.   Vertebrae: No fracture, evidence of discitis, or bone lesion. The patient has undergone L4-5 fusion since the prior MRI.   Conus medullaris and cauda equina: Conus extends to the L1 level. The conus appears normal. There is some buckling of descending nerve roots superior to the L2-3 level.   Paraspinal and other soft tissues: Negative.   Disc levels:   T10-11 and T11-12 are imaged in the sagittal plane only and unremarkable.   T12-L1: Negative.   L1-2: There is a shallow disc bulge without stenosis.   L2-3: Bulky ligamentum flavum thickening and a broad-based disc bulge cause severe central canal stenosis. Moderate bilateral foraminal narrowing is worse on the right. Moderate facet arthropathy noted.    L3-4: Disc bulge and bulky ligamentum flavum thickening cause moderate to moderately severe central canal stenosis. Mild foraminal narrowing is worse on the right.   L4-5: Status post fusion. There is moderate facet arthropathy. Mild central canal and right lateral recess narrowing is seen. The foramina appear open.   L5-S1: Moderate to advanced facet degenerative change is present. There is a small protrusion in the left foramen causing mild foraminal stenosis. The central canal and right foramen are open.   IMPRESSION: 1. Severe central canal stenosis at L2-3 with moderate bilateral foraminal narrowing worse on the right. 2. Moderate to moderately severe central canal stenosis at L3-4 with mild bilateral foraminal narrowing worse on the right. 3. Status post L4-5 fusion. Mild central canal and right lateral recess narrowing is seen at L4-5. 4. Small protrusion in the left foramen at L5-S1 causes mild foraminal narrowing.     Electronically Signed   By: Debby Prader M.D.   On: 12/26/2023 08:37  PMFS History: Patient Active Problem List   Diagnosis Date Noted   Esophageal dysphagia 02/08/2024   Abdominal pain, epigastric 01/13/2024   Melena 01/13/2024   Loss of weight 01/13/2024   Age-related osteoporosis without current pathological fracture 12/29/2023   Secondary hypercoagulable state 03/19/2022   Atypical chest pain 07/18/2021   Carotid stenosis 10/06/2019   Carotid artery disease 08/30/2019   Dyspnea on exertion 08/31/2018   Hyperlipidemia 11/02/2014   Renal artery stenosis 06/14/2013   Essential hypertension 06/14/2013   Past Medical History:  Diagnosis Date   Arthritis    Atrial fibrillation (HCC)    Cervical disc disease    Dyspnea    on exertion   History of kidney stones    Hyperlipidemia    Hypertension    Nosebleed    RBBB (right bundle branch block)    Renal artery stenosis    Stenosis of carotid artery     Family History  Problem Relation  Age of Onset   Hypertension Mother    Hypertension Sister    Diabetes Sister     Past Surgical History:  Procedure Laterality Date   BACK SURGERY     lumbar interbody fusion   Cardiolite  12/205   negative   CAROTID ENDARTERECTOMY Left 10/06/2019   CAROTID STENT  07/06/2004   CATARACT EXTRACTION Bilateral    DOPPLER ECHOCARDIOGRAPHY     normal 2D. borderline concentric LVH   ENDARTERECTOMY Left 10/06/2019   Procedure: LEFT ENDARTERECTOMY CAROTID;  Surgeon: Serene Gaile ORN, MD;  Location: [facility];  Service: Vascular;  Laterality: Left;   ESOPHAGOGASTRODUODENOSCOPY N/A 02/08/2024   Procedure: EGD (ESOPHAGOGASTRODUODENOSCOPY);  Surgeon: Eartha Flavors, Toribio,  MD;  Location: [facility];  Service: Gastroenterology;  Laterality: N/A;  900AM, ASA 3   kidney stent     PATCH ANGIOPLASTY Left 10/06/2019   Procedure: Patch Angioplasty using a Xenosure Biologic Patch;  Surgeon: Serene Gaile ORN, MD;  Location: [facility];  Service: Vascular;  Laterality: Left;   renal doppler  08/04/2006   patent right renal artery stent   TONSILLECTOMY     Social History   Occupational History   Not on file  Tobacco Use   Smoking status: Former    Current packs/day: 0.00    Types: Cigarettes    Quit date: 06/14/1985    Years since quitting: 38.9   Smokeless tobacco: Never   Tobacco comments:    Former smoker 03/19/22  Vaping Use   Vaping status: Never Used  Substance and Sexual Activity   Alcohol use: No   Drug use: No   Sexual activity: Not on file

## 2024-05-17 ENCOUNTER — Encounter (INDEPENDENT_AMBULATORY_CARE_PROVIDER_SITE_OTHER): Payer: Self-pay | Admitting: Gastroenterology

## 2024-06-02 ENCOUNTER — Ambulatory Visit: Admitting: Family

## 2024-06-02 DIAGNOSIS — M48062 Spinal stenosis, lumbar region with neurogenic claudication: Secondary | ICD-10-CM | POA: Diagnosis not present

## 2024-06-02 DIAGNOSIS — M5416 Radiculopathy, lumbar region: Secondary | ICD-10-CM

## 2024-06-02 DIAGNOSIS — G5793 Unspecified mononeuropathy of bilateral lower limbs: Secondary | ICD-10-CM

## 2024-06-02 MED ORDER — PREGABALIN 50 MG PO CAPS: 50.0000 mg | ORAL_CAPSULE | Freq: Three times a day (TID) | ORAL | 3 refills | Status: AC

## 2024-06-03 ENCOUNTER — Encounter: Payer: Self-pay | Admitting: Family

## 2024-06-03 NOTE — Progress Notes (Signed)
 Office Visit Note   Patient: Savannah Kirk           Date of Birth: February 04, 1934           MRN: [id number] Visit Date: 06/02/2024              Requested by: Sheryle Carwin, MD [address] [place],  [place] [address] PCP: Sheryle Carwin, MD  Chief Complaint  Patient presents with   Right Foot - Follow-up   Left Foot - Follow-up      HPI: The patient is an 89 year old woman who presents today in follow-up after about a month on Lyrica  for her bilateral foot pain numbness and tingling.  She is quite pleased with her progress she reports that she is about 90% better than prior to adding the Lyrica .  She states it is amazing  She does notice worse pain if she misses her morning dose of Lyrica .  She has not had any worsening lower extremity swelling or drowsiness  She does note a history of chronic low back pain history of spinal stenosis has had injections with Dr. Eldonna in the past which have been helpful she does remark that she feels it might be time for another injection  Assessment & Plan: Visit Diagnoses:  1. Lumbar stenosis with neurogenic claudication   2. Lumbar radiculopathy   3. Neuropathic pain of both feet     Plan: Will continue her Lyrica .  Discussed I will give her a 1 year prescription follow-up in 1 year call for any concerns or worsening.  Will refer her back to Dr. Eldonna for her back  Follow-Up Instructions: Return in about 1 year (around 06/02/2025).   Ortho Exam  Patient is alert, oriented, no adenopathy, well-dressed, normal affect, normal respiratory effort. On examination bilateral feet there is valgus deformity of bilateral great toes without erythema or edema.  Trace edema to the lower extremities and ankles consistent with venous insufficiency.  She does have palpable dorsalis pedis and posterior tibial pulses.  Onychomycosis x 4 No open sores or impending ulceration   Imaging: No results found. No images are attached to the  encounter.  Labs: No results found for: HGBA1C, ESRSEDRATE, CRP, LABURIC, REPTSTATUS, GRAMSTAIN, CULT, LABORGA   Lab Results  Component Value Date   ALBUMIN  4.4 06/12/2020   ALBUMIN  4.0 10/04/2019   ALBUMIN  3.8 09/07/2019    No results found for: MG No results found for: VD25OH  No results found for: PREALBUMIN    Latest Ref Rng & Units 11/17/2021   10:55 PM 10/07/2019    3:39 AM 10/04/2019    1:28 PM  CBC EXTENDED  WBC 4.0 - 10.5 K/uL 7.3  9.5  6.7   RBC 3.87 - 5.11 MIL/uL 4.18  3.79  4.31   Hemoglobin 12.0 - 15.0 g/dL 87.2  88.2  86.5   HCT 36.0 - 46.0 % 39.2  35.5  40.8   Platelets 150 - 400 K/uL 231  199  236      There is no height or weight on file to calculate BMI.  Orders:  Orders Placed This Encounter  Procedures   Ambulatory referral to Physical Medicine Rehab   Meds ordered this encounter  Medications   pregabalin  (LYRICA ) 50 MG capsule    Sig: Take 1 capsule (50 mg total) by mouth 3 (three) times daily.    Dispense:  270 capsule    Refill:  3     Procedures: No procedures performed  Clinical Data: No additional findings.  ROS:  All other systems negative, except as noted in the HPI. Review of Systems  Objective: Vital Signs: There were no vitals taken for this visit.  Specialty Comments:  CLINICAL DATA:  Low back pain and neurogenic claudication. History of prior lumbar surgery.   EXAM: MRI LUMBAR SPINE WITHOUT CONTRAST   TECHNIQUE: Multiplanar, multisequence MR imaging of the lumbar spine was performed. No intravenous contrast was administered.   COMPARISON:  MRI lumbar spine 02/13/2015.   FINDINGS: Segmentation:  Standard.   Alignment:  Trace anterolisthesis L4 on L5 is unchanged.   Vertebrae: No fracture, evidence of discitis, or bone lesion. The patient has undergone L4-5 fusion since the prior MRI.   Conus medullaris and cauda equina: Conus extends to the L1 level. The conus appears normal. There is  some buckling of descending nerve roots superior to the L2-3 level.   Paraspinal and other soft tissues: Negative.   Disc levels:   T10-11 and T11-12 are imaged in the sagittal plane only and unremarkable.   T12-L1: Negative.   L1-2: There is a shallow disc bulge without stenosis.   L2-3: Bulky ligamentum flavum thickening and a broad-based disc bulge cause severe central canal stenosis. Moderate bilateral foraminal narrowing is worse on the right. Moderate facet arthropathy noted.   L3-4: Disc bulge and bulky ligamentum flavum thickening cause moderate to moderately severe central canal stenosis. Mild foraminal narrowing is worse on the right.   L4-5: Status post fusion. There is moderate facet arthropathy. Mild central canal and right lateral recess narrowing is seen. The foramina appear open.   L5-S1: Moderate to advanced facet degenerative change is present. There is a small protrusion in the left foramen causing mild foraminal stenosis. The central canal and right foramen are open.   IMPRESSION: 1. Severe central canal stenosis at L2-3 with moderate bilateral foraminal narrowing worse on the right. 2. Moderate to moderately severe central canal stenosis at L3-4 with mild bilateral foraminal narrowing worse on the right. 3. Status post L4-5 fusion. Mild central canal and right lateral recess narrowing is seen at L4-5. 4. Small protrusion in the left foramen at L5-S1 causes mild foraminal narrowing.     Electronically Signed   By: Debby Prader M.D.   On: 12/26/2023 08:37  PMFS History: Patient Active Problem List   Diagnosis Date Noted   Esophageal dysphagia 02/08/2024   Abdominal pain, epigastric 01/13/2024   Melena 01/13/2024   Loss of weight 01/13/2024   Age-related osteoporosis without current pathological fracture 12/29/2023   Secondary hypercoagulable state 03/19/2022   Atypical chest pain 07/18/2021   Carotid stenosis 10/06/2019   Carotid artery  disease 08/30/2019   Dyspnea on exertion 08/31/2018   Hyperlipidemia 11/02/2014   Renal artery stenosis 06/14/2013   Essential hypertension 06/14/2013   Past Medical History:  Diagnosis Date   Arthritis    Atrial fibrillation (HCC)    Cervical disc disease    Dyspnea    on exertion   History of kidney stones    Hyperlipidemia    Hypertension    Nosebleed    RBBB (right bundle branch block)    Renal artery stenosis    Stenosis of carotid artery     Family History  Problem Relation Age of Onset   Hypertension Mother    Hypertension Sister    Diabetes Sister     Past Surgical History:  Procedure Laterality Date   BACK SURGERY     lumbar interbody fusion  Cardiolite  12/205   negative   CAROTID ENDARTERECTOMY Left 10/06/2019   CAROTID STENT  07/06/2004   CATARACT EXTRACTION Bilateral    DOPPLER ECHOCARDIOGRAPHY     normal 2D. borderline concentric LVH   ENDARTERECTOMY Left 10/06/2019   Procedure: LEFT ENDARTERECTOMY CAROTID;  Surgeon: Serene Gaile ORN, MD;  Location: [facility] OR;  Service: Vascular;  Laterality: Left;   ESOPHAGOGASTRODUODENOSCOPY N/A 02/08/2024   Procedure: EGD (ESOPHAGOGASTRODUODENOSCOPY);  Surgeon: Eartha Flavors, Toribio, MD;  Location: [facility]ITE;  Service: Gastroenterology;  Laterality: N/A;  900AM, ASA 3   kidney stent     PATCH ANGIOPLASTY Left 10/06/2019   Procedure: Patch Angioplasty using a Xenosure Biologic Patch;  Surgeon: Serene Gaile ORN, MD;  Location: [facility] OR;  Service: Vascular;  Laterality: Left;   renal doppler  08/04/2006   patent right renal artery stent   TONSILLECTOMY     Social History   Occupational History   Not on file  Tobacco Use   Smoking status: Former    Current packs/day: 0.00    Types: Cigarettes    Quit date: 06/14/1985    Years since quitting: 38.9   Smokeless tobacco: Never   Tobacco comments:    Former smoker 03/19/22  Vaping Use   Vaping status: Never Used  Substance and Sexual Activity   Alcohol use: No    Drug use: No   Sexual activity: Not on file

## 2024-07-03 ENCOUNTER — Other Ambulatory Visit: Payer: Self-pay

## 2024-07-03 ENCOUNTER — Ambulatory Visit (INDEPENDENT_AMBULATORY_CARE_PROVIDER_SITE_OTHER): Admitting: Physical Medicine and Rehabilitation

## 2024-07-03 VITALS — BP 145/76 | HR 85

## 2024-07-03 DIAGNOSIS — M5416 Radiculopathy, lumbar region: Secondary | ICD-10-CM | POA: Diagnosis not present

## 2024-07-03 MED ORDER — DEXAMETHASONE SOD PHOSPHATE PF 10 MG/ML IJ SOLN
15.0000 mg | Freq: Once | INTRAMUSCULAR | Status: AC
  Administered 2024-07-03: 15 mg

## 2024-07-03 NOTE — Progress Notes (Signed)
 Pain Scale   Average Pain 10 Patient advising she has lower back pain radiating to left leg pain increases when standing and walking and decreases when sitting.        +Driver, -BT, -Dye Allergies.

## 2024-07-03 NOTE — Progress Notes (Signed)
 "  Savannah Kirk - 89 y.o. female MRN [id number]  Date of birth: 24-Dec-1933  Office Visit Note: Visit Date: 07/03/2024 PCP: Sheryle Carwin, MD Referred by: Sheryle Carwin, MD  Subjective: Chief Complaint  Patient presents with   Lower Back - Pain   HPI:  Savannah Kirk is a 89 y.o. female who comes in today for planned repeat Bilateral L2-3  Lumbar Transforaminal epidural steroid injection with fluoroscopic guidance.  The patient has failed conservative care including home exercise, medications, time and activity modification.  This injection will be diagnostic and hopefully therapeutic.  Please see requesting physician notes for further details and justification. Patient received more than 50% pain relief from prior injection. Severe stenosis at L2-3 and L3-4. Rocky Hans, FNP added lyrica  for her feet. She has a new pain in hte left lateral calf that started this past Saturday. She wonders if she pulled a muscle. She reports that her sciatica she had surgery for was never as bad as this pain. I suggested the stenosis could be the source but if the injection did not help then she might want to see the ortho folks.  Referring: Rocky Hans, FNP   ROS Otherwise per HPI.  Assessment & Plan: Visit Diagnoses:    ICD-10-CM   1. Lumbar radiculopathy  M54.16 XR C-ARM NO REPORT    Epidural Steroid injection    dexamethasone  (DECADRON ) injection 15 mg      Plan: No additional findings.   Meds & Orders:  Meds ordered this encounter  Medications   dexamethasone  (DECADRON ) injection 15 mg    Orders Placed This Encounter  Procedures   XR C-ARM NO REPORT   Epidural Steroid injection    Follow-up: Return for visit to requesting provider as needed.   Procedures: No procedures performed  Lumbosacral Transforaminal Epidural Steroid Injection - Sub-Pedicular Approach with Fluoroscopic Guidance  Patient: Savannah Kirk      Date of Birth: 31-Aug-1933 MRN: [id number] PCP: Sheryle Carwin, MD      Visit Date:  07/03/2024   Universal Protocol:    Date/Time: 07/03/2024  Consent Given By: the patient  Position: PRONE  Additional Comments: Vital signs were monitored before and after the procedure. Patient was prepped and draped in the usual sterile fashion. The correct patient, procedure, and site was verified.   Injection Procedure Details:   Procedure diagnoses: Lumbar radiculopathy [M54.16]    Meds Administered:  Meds ordered this encounter  Medications   dexamethasone  (DECADRON ) injection 15 mg    Laterality: Bilateral  Location/Site: L2  Needle:3.5 in., 22 ga.  Short bevel or Quincke spinal needle  Needle Placement: Transforaminal  Findings:    -Comments: Excellent flow of contrast along the nerve, nerve root and into the epidural space.  Procedure Details: After squaring off the end-plates to get a true AP view, the C-arm was positioned so that an oblique view of the foramen as noted above was visualized. The target area is just inferior to the nose of the scotty dog or sub pedicular. The soft tissues overlying this structure were infiltrated with 2-3 ml. of 1% Lidocaine  without Epinephrine.  The spinal needle was inserted toward the target using a trajectory view along the fluoroscope beam.  Under AP and lateral visualization, the needle was advanced so it did not puncture dura and was located close the 6 O'Clock position of the pedical in AP tracterory. Biplanar projections were used to confirm position. Aspiration was confirmed to be negative for CSF and/or blood.  A 1-2 ml. volume of Isovue-250 was injected and flow of contrast was noted at each level. Radiographs were obtained for documentation purposes.   After attaining the desired flow of contrast documented above, a 0.5 to 1.0 ml test dose of 0.25% Marcaine was injected into each respective transforaminal space.  The patient was observed for 90 seconds post injection.  After no sensory deficits were reported, and  normal lower extremity motor function was noted,   the above injectate was administered so that equal amounts of the injectate were placed at each foramen (level) into the transforaminal epidural space.   Additional Comments:  The patient tolerated the procedure well Dressing: 2 x 2 sterile gauze and Band-Aid    Post-procedure details: Patient was observed during the procedure. Post-procedure instructions were reviewed.  Patient left the clinic in stable condition.     Clinical History: CLINICAL DATA:  Low back pain and neurogenic claudication. History of prior lumbar surgery.   EXAM: MRI LUMBAR SPINE WITHOUT CONTRAST   TECHNIQUE: Multiplanar, multisequence MR imaging of the lumbar spine was performed. No intravenous contrast was administered.   COMPARISON:  MRI lumbar spine 02/13/2015.   FINDINGS: Segmentation:  Standard.   Alignment:  Trace anterolisthesis L4 on L5 is unchanged.   Vertebrae: No fracture, evidence of discitis, or bone lesion. The patient has undergone L4-5 fusion since the prior MRI.   Conus medullaris and cauda equina: Conus extends to the L1 level. The conus appears normal. There is some buckling of descending nerve roots superior to the L2-3 level.   Paraspinal and other soft tissues: Negative.   Disc levels:   T10-11 and T11-12 are imaged in the sagittal plane only and unremarkable.   T12-L1: Negative.   L1-2: There is a shallow disc bulge without stenosis.   L2-3: Bulky ligamentum flavum thickening and a broad-based disc bulge cause severe central canal stenosis. Moderate bilateral foraminal narrowing is worse on the right. Moderate facet arthropathy noted.   L3-4: Disc bulge and bulky ligamentum flavum thickening cause moderate to moderately severe central canal stenosis. Mild foraminal narrowing is worse on the right.   L4-5: Status post fusion. There is moderate facet arthropathy. Mild central canal and right lateral recess  narrowing is seen. The foramina appear open.   L5-S1: Moderate to advanced facet degenerative change is present. There is a small protrusion in the left foramen causing mild foraminal stenosis. The central canal and right foramen are open.   IMPRESSION: 1. Severe central canal stenosis at L2-3 with moderate bilateral foraminal narrowing worse on the right. 2. Moderate to moderately severe central canal stenosis at L3-4 with mild bilateral foraminal narrowing worse on the right. 3. Status post L4-5 fusion. Mild central canal and right lateral recess narrowing is seen at L4-5. 4. Small protrusion in the left foramen at L5-S1 causes mild foraminal narrowing.     Electronically Signed   By: Debby Prader M.D.   On: 12/26/2023 08:37     Objective:  VS:  HT:    WT:   BMI:     BP:(!) 145/76  HR:85bpm  TEMP: ( )  RESP:  Physical Exam Vitals and nursing note reviewed.  Constitutional:      General: She is not in acute distress.    Appearance: Normal appearance. She is not ill-appearing.  HENT:     Head: Normocephalic and atraumatic.     Right Ear: External ear normal.     Left Ear: External ear normal.  Eyes:  Extraocular Movements: Extraocular movements intact.  Cardiovascular:     Rate and Rhythm: Normal rate.     Pulses: Normal pulses.  Pulmonary:     Effort: Pulmonary effort is normal. No respiratory distress.  Abdominal:     General: There is no distension.     Palpations: Abdomen is soft.  Musculoskeletal:        General: Tenderness present.     Cervical back: Neck supple.     Right lower leg: No edema.     Left lower leg: No edema.     Comments: Patient has good distal strength with no pain over the greater trochanters.  No clonus or focal weakness. Ambulates with cane.  Skin:    Findings: No erythema, lesion or rash.  Neurological:     General: No focal deficit present.     Mental Status: She is alert and oriented to person, place, and time.      Sensory: No sensory deficit.     Motor: No weakness or abnormal muscle tone.     Coordination: Coordination normal.     Gait: Gait abnormal.  Psychiatric:        Mood and Affect: Mood normal.        Behavior: Behavior normal.      Imaging: No results found. "

## 2024-07-03 NOTE — Procedures (Signed)
 Lumbosacral Transforaminal Epidural Steroid Injection - Sub-Pedicular Approach with Fluoroscopic Guidance  Patient: Savannah Kirk      Date of Birth: July 13, 1934 MRN: [id number] PCP: Sheryle Carwin, MD      Visit Date: 07/03/2024   Universal Protocol:    Date/Time: 07/03/2024  Consent Given By: the patient  Position: PRONE  Additional Comments: Vital signs were monitored before and after the procedure. Patient was prepped and draped in the usual sterile fashion. The correct patient, procedure, and site was verified.   Injection Procedure Details:   Procedure diagnoses: Lumbar radiculopathy [M54.16]    Meds Administered:  Meds ordered this encounter  Medications   dexamethasone  (DECADRON ) injection 15 mg    Laterality: Bilateral  Location/Site: L2  Needle:3.5 in., 22 ga.  Short bevel or Quincke spinal needle  Needle Placement: Transforaminal  Findings:    -Comments: Excellent flow of contrast along the nerve, nerve root and into the epidural space.  Procedure Details: After squaring off the end-plates to get a true AP view, the C-arm was positioned so that an oblique view of the foramen as noted above was visualized. The target area is just inferior to the nose of the scotty dog or sub pedicular. The soft tissues overlying this structure were infiltrated with 2-3 ml. of 1% Lidocaine  without Epinephrine.  The spinal needle was inserted toward the target using a trajectory view along the fluoroscope beam.  Under AP and lateral visualization, the needle was advanced so it did not puncture dura and was located close the 6 O'Clock position of the pedical in AP tracterory. Biplanar projections were used to confirm position. Aspiration was confirmed to be negative for CSF and/or blood. A 1-2 ml. volume of Isovue-250 was injected and flow of contrast was noted at each level. Radiographs were obtained for documentation purposes.   After attaining the desired flow of contrast  documented above, a 0.5 to 1.0 ml test dose of 0.25% Marcaine was injected into each respective transforaminal space.  The patient was observed for 90 seconds post injection.  After no sensory deficits were reported, and normal lower extremity motor function was noted,   the above injectate was administered so that equal amounts of the injectate were placed at each foramen (level) into the transforaminal epidural space.   Additional Comments:  The patient tolerated the procedure well Dressing: 2 x 2 sterile gauze and Band-Aid    Post-procedure details: Patient was observed during the procedure. Post-procedure instructions were reviewed.  Patient left the clinic in stable condition.

## 2024-08-22 ENCOUNTER — Ambulatory Visit: Admitting: Orthopedic Surgery
